# Patient Record
Sex: Female | Born: 1945
Health system: Southern US, Community
[De-identification: ages and names within clinical notes are randomized; demographics above are authoritative.]

## PROBLEM LIST (undated history)

## (undated) DIAGNOSIS — Z8619 Personal history of other infectious and parasitic diseases: Secondary | ICD-10-CM

## (undated) DIAGNOSIS — H269 Unspecified cataract: Secondary | ICD-10-CM

## (undated) DIAGNOSIS — E785 Hyperlipidemia, unspecified: Secondary | ICD-10-CM

## (undated) DIAGNOSIS — C50919 Malignant neoplasm of unspecified site of unspecified female breast: Secondary | ICD-10-CM

## (undated) DIAGNOSIS — M329 Systemic lupus erythematosus, unspecified: Secondary | ICD-10-CM

## (undated) DIAGNOSIS — F419 Anxiety disorder, unspecified: Secondary | ICD-10-CM

## (undated) DIAGNOSIS — C55 Malignant neoplasm of uterus, part unspecified: Secondary | ICD-10-CM

## (undated) DIAGNOSIS — F4310 Post-traumatic stress disorder, unspecified: Secondary | ICD-10-CM

## (undated) HISTORY — PX: PLACEMENT OF BREAST IMPLANTS: SHX6334

## (undated) HISTORY — DX: Post-traumatic stress disorder, unspecified: F43.10

## (undated) HISTORY — PX: WRIST SURGERY: SHX841

## (undated) HISTORY — DX: Personal history of other infectious and parasitic diseases: Z86.19

## (undated) HISTORY — PX: TONSILLECTOMY: SUR1361

## (undated) HISTORY — PX: RECONSTRUCTION / CORRECTION OF NIPPLE / AEROLA: SUR1073

## (undated) HISTORY — PX: DILATION AND CURETTAGE OF UTERUS: SHX78

## (undated) HISTORY — DX: Unspecified cataract: H26.9

## (undated) HISTORY — DX: Hyperlipidemia, unspecified: E78.5

## (undated) HISTORY — PX: MASTECTOMY: SHX3

## (undated) HISTORY — PX: BREAST IMPLANT REMOVAL: SHX5361

## (undated) HISTORY — PX: BACK SURGERY: SHX140

## (undated) HISTORY — PX: APPENDECTOMY: SHX54

## (undated) HISTORY — PX: WISDOM TOOTH EXTRACTION: SHX21

---

## 1974-07-31 HISTORY — PX: ABDOMINAL HYSTERECTOMY: SHX81

## 1989-07-31 HISTORY — PX: BREAST SURGERY: SHX581

## 2003-08-01 HISTORY — PX: JOINT REPLACEMENT: SHX530

## 2011-11-08 DIAGNOSIS — N393 Stress incontinence (female) (male): Secondary | ICD-10-CM | POA: Diagnosis not present

## 2011-11-08 DIAGNOSIS — M1710 Unilateral primary osteoarthritis, unspecified knee: Secondary | ICD-10-CM | POA: Diagnosis not present

## 2011-11-08 DIAGNOSIS — F329 Major depressive disorder, single episode, unspecified: Secondary | ICD-10-CM | POA: Diagnosis not present

## 2011-11-08 DIAGNOSIS — M329 Systemic lupus erythematosus, unspecified: Secondary | ICD-10-CM | POA: Diagnosis not present

## 2011-11-15 DIAGNOSIS — H16149 Punctate keratitis, unspecified eye: Secondary | ICD-10-CM | POA: Diagnosis not present

## 2011-11-22 DIAGNOSIS — H16149 Punctate keratitis, unspecified eye: Secondary | ICD-10-CM | POA: Diagnosis not present

## 2011-12-12 DIAGNOSIS — N816 Rectocele: Secondary | ICD-10-CM | POA: Diagnosis not present

## 2011-12-12 DIAGNOSIS — Z01419 Encounter for gynecological examination (general) (routine) without abnormal findings: Secondary | ICD-10-CM | POA: Diagnosis not present

## 2011-12-12 DIAGNOSIS — Z1289 Encounter for screening for malignant neoplasm of other sites: Secondary | ICD-10-CM | POA: Diagnosis not present

## 2011-12-12 DIAGNOSIS — N8111 Cystocele, midline: Secondary | ICD-10-CM | POA: Diagnosis not present

## 2011-12-18 DIAGNOSIS — Z96659 Presence of unspecified artificial knee joint: Secondary | ICD-10-CM | POA: Diagnosis not present

## 2011-12-18 DIAGNOSIS — M25569 Pain in unspecified knee: Secondary | ICD-10-CM | POA: Diagnosis not present

## 2011-12-18 DIAGNOSIS — M1710 Unilateral primary osteoarthritis, unspecified knee: Secondary | ICD-10-CM | POA: Diagnosis not present

## 2012-04-22 DIAGNOSIS — N816 Rectocele: Secondary | ICD-10-CM | POA: Diagnosis not present

## 2012-04-22 DIAGNOSIS — N8111 Cystocele, midline: Secondary | ICD-10-CM | POA: Diagnosis not present

## 2012-04-22 DIAGNOSIS — Z01818 Encounter for other preprocedural examination: Secondary | ICD-10-CM | POA: Diagnosis not present

## 2012-05-08 DIAGNOSIS — N393 Stress incontinence (female) (male): Secondary | ICD-10-CM | POA: Diagnosis not present

## 2012-05-08 DIAGNOSIS — Z853 Personal history of malignant neoplasm of breast: Secondary | ICD-10-CM | POA: Diagnosis not present

## 2012-05-08 DIAGNOSIS — D649 Anemia, unspecified: Secondary | ICD-10-CM | POA: Diagnosis not present

## 2012-05-08 DIAGNOSIS — M329 Systemic lupus erythematosus, unspecified: Secondary | ICD-10-CM | POA: Diagnosis not present

## 2012-05-08 DIAGNOSIS — Z8541 Personal history of malignant neoplasm of cervix uteri: Secondary | ICD-10-CM | POA: Diagnosis not present

## 2012-05-08 DIAGNOSIS — F329 Major depressive disorder, single episode, unspecified: Secondary | ICD-10-CM | POA: Diagnosis not present

## 2012-05-08 DIAGNOSIS — E039 Hypothyroidism, unspecified: Secondary | ICD-10-CM | POA: Diagnosis not present

## 2012-05-08 DIAGNOSIS — N814 Uterovaginal prolapse, unspecified: Secondary | ICD-10-CM | POA: Diagnosis not present

## 2012-05-08 DIAGNOSIS — N8111 Cystocele, midline: Secondary | ICD-10-CM | POA: Diagnosis not present

## 2012-05-08 DIAGNOSIS — N816 Rectocele: Secondary | ICD-10-CM | POA: Diagnosis not present

## 2012-05-09 DIAGNOSIS — D649 Anemia, unspecified: Secondary | ICD-10-CM | POA: Diagnosis not present

## 2012-05-09 DIAGNOSIS — F329 Major depressive disorder, single episode, unspecified: Secondary | ICD-10-CM | POA: Diagnosis not present

## 2012-05-09 DIAGNOSIS — M329 Systemic lupus erythematosus, unspecified: Secondary | ICD-10-CM | POA: Diagnosis not present

## 2012-05-09 DIAGNOSIS — N393 Stress incontinence (female) (male): Secondary | ICD-10-CM | POA: Diagnosis not present

## 2012-05-09 DIAGNOSIS — E039 Hypothyroidism, unspecified: Secondary | ICD-10-CM | POA: Diagnosis not present

## 2012-05-09 DIAGNOSIS — N814 Uterovaginal prolapse, unspecified: Secondary | ICD-10-CM | POA: Diagnosis not present

## 2012-05-23 DIAGNOSIS — R3989 Other symptoms and signs involving the genitourinary system: Secondary | ICD-10-CM | POA: Diagnosis not present

## 2012-10-17 DIAGNOSIS — E039 Hypothyroidism, unspecified: Secondary | ICD-10-CM | POA: Diagnosis not present

## 2012-10-17 DIAGNOSIS — M25569 Pain in unspecified knee: Secondary | ICD-10-CM | POA: Diagnosis not present

## 2013-02-25 ENCOUNTER — Ambulatory Visit: Payer: Self-pay | Admitting: General Practice

## 2013-03-03 ENCOUNTER — Ambulatory Visit: Payer: Medicare Other | Admitting: General Practice

## 2013-03-08 NOTE — Progress Notes (Signed)
Patient ID: Kimberly Roth, female   DOB: 1946/01/05, 67 y.o.   MRN: [id number] No Show

## 2013-04-09 ENCOUNTER — Encounter: Payer: Self-pay | Admitting: Nurse Practitioner

## 2013-04-09 ENCOUNTER — Ambulatory Visit (INDEPENDENT_AMBULATORY_CARE_PROVIDER_SITE_OTHER): Payer: Medicare Other | Admitting: Nurse Practitioner

## 2013-04-09 VITALS — BP 129/77 | HR 86 | Temp 96.7°F | Ht 65.0 in | Wt 132.0 lb

## 2013-04-09 DIAGNOSIS — F329 Major depressive disorder, single episode, unspecified: Secondary | ICD-10-CM

## 2013-04-09 DIAGNOSIS — E039 Hypothyroidism, unspecified: Secondary | ICD-10-CM | POA: Diagnosis not present

## 2013-04-09 DIAGNOSIS — F411 Generalized anxiety disorder: Secondary | ICD-10-CM | POA: Diagnosis not present

## 2013-04-09 MED ORDER — CHLORDIAZEPOXIDE HCL 25 MG PO CAPS: 25.0000 mg | ORAL_CAPSULE | Freq: Two times a day (BID) | ORAL | Status: DC

## 2013-04-09 MED ORDER — LEVOTHYROXINE SODIUM 200 MCG PO TABS: 200.0000 ug | ORAL_TABLET | Freq: Every day | ORAL | Status: DC

## 2013-04-09 MED ORDER — BUPROPION HCL 100 MG PO TABS: 100.0000 mg | ORAL_TABLET | Freq: Two times a day (BID) | ORAL | Status: DC

## 2013-04-09 NOTE — Patient Instructions (Signed)

## 2013-04-09 NOTE — Progress Notes (Signed)
Subjective:    Patient ID: Kimberly Roth, female    DOB: 09-29-1945, 67 y.o.   MRN: [id number]  Thyroid Problem Presents for initial visit. Symptoms include anxiety and depressed mood. Patient reports no fatigue, hair loss, heat intolerance, hoarse voice, palpitations or weight loss. The symptoms have been resolved. Past treatments include levothyroxine. The treatment provided significant relief. There is no history of hyperlipidemia or obesity.  Anxiety Presents for initial visit. Onset was more than 5 years ago. The problem has been resolved. Symptoms include depressed mood. Patient reports no insomnia, irritability, nervous/anxious behavior or palpitations. Symptoms occur rarely. The quality of sleep is fair. Nighttime awakenings: one to two.   Her past medical history is significant for depression. There is no history of bipolar disorder. Past treatments include SSRIs. The treatment provided significant relief. Compliance with prior treatments has been good.   Depression Pt on Wellbutrin 100mg - pt states this works well for her and has no complaints as long as she takes her medication.   Review of Systems  Constitutional: Negative for weight loss, irritability and fatigue.  HENT: Negative for hoarse voice.   Cardiovascular: Negative for palpitations.  Endocrine: Negative for heat intolerance.  Psychiatric/Behavioral: The patient is not nervous/anxious and does not have insomnia.   All other systems reviewed and are negative.       Objective:   Physical Exam  Constitutional: She is oriented to person, place, and time. She appears well-developed and well-nourished.  HENT:  Nose: Nose normal.  Mouth/Throat: Oropharynx is clear and moist.  Eyes: EOM are normal.  Neck: Trachea normal, normal range of motion and full passive range of motion without pain. Neck supple. No JVD present. Carotid bruit is not present. No thyromegaly present.  Cardiovascular: Normal rate, regular rhythm,  normal heart sounds and intact distal pulses.  Exam reveals no gallop and no friction rub.   No murmur heard. Pulmonary/Chest: Effort normal and breath sounds normal.  Abdominal: Soft. Bowel sounds are normal. She exhibits no distension and no mass. There is no tenderness.  Musculoskeletal: Normal range of motion.  Lymphadenopathy:    She has no cervical adenopathy.  Neurological: She is alert and oriented to person, place, and time. She has normal reflexes.  Skin: Skin is warm and dry.  Psychiatric: She has a normal mood and affect. Her behavior is normal. Judgment and thought content normal.      BP 129/77  Pulse 86  Temp(Src) 96.7 F (35.9 C) (Oral)  Ht 5\' 5"  (1.651 m)  Wt 132 lb (59.875 kg)  BMI 21.97 kg/m2     Assessment & Plan:   1. Hypothyroidism   2. Depression   3. GAD (generalized anxiety disorder)    Orders Placed This Encounter  Procedures  . CMP14+EGFR  . NMR, lipoprofile  . Thyroid Panel With TSH     Medication List       This list is accurate as of: 04/09/13  2:43 PM.  Always use your most recent med list.               buPROPion 100 MG tablet  Commonly known as:  WELLBUTRIN  Take 1 tablet (100 mg total) by mouth 2 (two) times daily. 2 tabs in am and one in pm     chlordiazePOXIDE 25 MG capsule  Commonly known as:  LIBRIUM  Take 1 capsule (25 mg total) by mouth 2 (two) times daily.     levothyroxine 200 MCG tablet  Commonly known as:  SYNTHROID, LEVOTHROID  Take 1 tablet (200 mcg total) by mouth daily before breakfast.        continue all meds Labs pending Follow up in 3 months  Kimberly Daphine Deutscher, FNP

## 2013-04-11 ENCOUNTER — Other Ambulatory Visit: Payer: Self-pay | Admitting: Nurse Practitioner

## 2013-04-11 DIAGNOSIS — E039 Hypothyroidism, unspecified: Secondary | ICD-10-CM

## 2013-04-11 LAB — NMR, LIPOPROFILE
Cholesterol: 207 mg/dL — ABNORMAL HIGH
HDL Cholesterol by NMR: 72 mg/dL
HDL Particle Number: 35.6 umol/L
LDL Particle Number: 1196 nmol/L — ABNORMAL HIGH
LDL Size: 21.5 nm
LDLC SERPL CALC-MCNC: 123 mg/dL — ABNORMAL HIGH
LP-IR Score: <25
Small LDL Particle Number: 170 nmol/L
Triglycerides by NMR: 60 mg/dL

## 2013-04-11 LAB — CMP14+EGFR
ALT: 17 IU/L (ref 0–32)
AST: 20 IU/L (ref 0–40)
Albumin/Globulin Ratio: 2.7 — ABNORMAL HIGH (ref 1.1–2.5)
Albumin: 4.6 g/dL (ref 3.6–4.8)
Alkaline Phosphatase: 75 IU/L (ref 39–117)
BUN/Creatinine Ratio: 28 — ABNORMAL HIGH (ref 11–26)
BUN: 16 mg/dL (ref 8–27)
CO2: 28 mmol/L (ref 18–29)
Calcium: 10 mg/dL (ref 8.6–10.2)
Chloride: 99 mmol/L (ref 97–108)
Creatinine, Ser: 0.57 mg/dL (ref 0.57–1.00)
GFR calc Af Amer: 112 mL/min/1.73
GFR calc non Af Amer: 97 mL/min/1.73
Globulin, Total: 1.7 g/dL (ref 1.5–4.5)
Glucose: 91 mg/dL (ref 65–99)
Potassium: 4.5 mmol/L (ref 3.5–5.2)
Sodium: 140 mmol/L (ref 134–144)
Total Bilirubin: 0.2 mg/dL (ref 0.0–1.2)
Total Protein: 6.3 g/dL (ref 6.0–8.5)

## 2013-04-11 LAB — THYROID PANEL WITH TSH
Free Thyroxine Index: 5.1 — ABNORMAL HIGH (ref 1.2–4.9)
T3 Uptake Ratio: 32 % (ref 24–39)
T4, Total: 15.9 ug/dL — ABNORMAL HIGH (ref 4.5–12.0)
TSH: 0.008 u[IU]/mL — ABNORMAL LOW (ref 0.450–4.500)

## 2013-04-11 MED ORDER — LEVOTHYROXINE SODIUM 200 MCG PO TABS: 200.0000 ug | ORAL_TABLET | Freq: Every day | ORAL | Status: DC

## 2013-04-11 MED ORDER — LEVOTHYROXINE SODIUM 175 MCG PO TABS: 175.0000 ug | ORAL_TABLET | Freq: Every day | ORAL | Status: DC

## 2013-04-30 DIAGNOSIS — H5210 Myopia, unspecified eye: Secondary | ICD-10-CM | POA: Diagnosis not present

## 2013-04-30 DIAGNOSIS — H524 Presbyopia: Secondary | ICD-10-CM | POA: Diagnosis not present

## 2013-06-10 ENCOUNTER — Other Ambulatory Visit: Payer: Self-pay

## 2013-06-10 DIAGNOSIS — F411 Generalized anxiety disorder: Secondary | ICD-10-CM

## 2013-06-10 MED ORDER — CHLORDIAZEPOXIDE HCL 25 MG PO CAPS: 25.0000 mg | ORAL_CAPSULE | Freq: Two times a day (BID) | ORAL | Status: DC

## 2013-06-10 NOTE — Telephone Encounter (Signed)
Last see 04/09/13 MMM   Patient requesting 180   If approved print and route to nurse

## 2013-06-11 ENCOUNTER — Telehealth: Payer: Self-pay | Admitting: Nurse Practitioner

## 2013-07-31 HISTORY — PX: EYE SURGERY: SHX253

## 2013-10-14 DIAGNOSIS — H2510 Age-related nuclear cataract, unspecified eye: Secondary | ICD-10-CM | POA: Diagnosis not present

## 2013-10-14 DIAGNOSIS — H04129 Dry eye syndrome of unspecified lacrimal gland: Secondary | ICD-10-CM | POA: Diagnosis not present

## 2013-10-17 ENCOUNTER — Encounter (HOSPITAL_COMMUNITY): Payer: Self-pay | Admitting: Pharmacy Technician

## 2013-10-21 DIAGNOSIS — H2510 Age-related nuclear cataract, unspecified eye: Secondary | ICD-10-CM | POA: Diagnosis not present

## 2013-10-24 ENCOUNTER — Encounter (HOSPITAL_COMMUNITY): Admission: RE | Admit: 2013-10-24 | Payer: Self-pay | Source: Ambulatory Visit

## 2013-10-27 ENCOUNTER — Encounter (HOSPITAL_COMMUNITY)
Admission: RE | Admit: 2013-10-27 | Discharge: 2013-10-27 | Disposition: A | Discharge status: Home / Self Care | Payer: Medicare Other | Source: Ambulatory Visit | Attending: Ophthalmology | Admitting: Ophthalmology

## 2013-10-27 ENCOUNTER — Encounter (HOSPITAL_COMMUNITY): Payer: Self-pay

## 2013-10-27 DIAGNOSIS — Z79899 Other long term (current) drug therapy: Secondary | ICD-10-CM | POA: Diagnosis not present

## 2013-10-27 DIAGNOSIS — Z7982 Long term (current) use of aspirin: Secondary | ICD-10-CM | POA: Diagnosis not present

## 2013-10-27 DIAGNOSIS — H2510 Age-related nuclear cataract, unspecified eye: Secondary | ICD-10-CM | POA: Diagnosis not present

## 2013-10-27 DIAGNOSIS — F411 Generalized anxiety disorder: Secondary | ICD-10-CM | POA: Diagnosis not present

## 2013-10-27 HISTORY — DX: Anxiety disorder, unspecified: F41.9

## 2013-10-27 HISTORY — DX: Malignant neoplasm of unspecified site of unspecified female breast: C50.919

## 2013-10-27 HISTORY — DX: Malignant neoplasm of uterus, part unspecified: C55

## 2013-10-27 HISTORY — DX: Systemic lupus erythematosus, unspecified: M32.9

## 2013-10-27 LAB — BASIC METABOLIC PANEL
BUN: 11 mg/dL (ref 6–23)
CHLORIDE: 98 meq/L (ref 96–112)
CO2: 30 mEq/L (ref 19–32)
Calcium: 9.7 mg/dL (ref 8.4–10.5)
Creatinine, Ser: 0.73 mg/dL (ref 0.50–1.10)
GFR calc non Af Amer: 87 mL/min — ABNORMAL LOW (ref >90)
GLUCOSE: 92 mg/dL (ref 70–99)
Potassium: 4.1 mEq/L (ref 3.7–5.3)
Sodium: 138 mEq/L (ref 137–147)

## 2013-10-27 LAB — HEMOGLOBIN AND HEMATOCRIT, BLOOD
HCT: 35.2 % — ABNORMAL LOW (ref 36.0–46.0)
HEMOGLOBIN: 11.8 g/dL — AB (ref 12.0–15.0)

## 2013-10-27 NOTE — Patient Instructions (Signed)
Cataract A cataract is a clouding of the lens of the eye. When a lens becomes cloudy, vision is reduced based on the degree and nature of the clouding. Many cataracts reduce vision to some degree. Some cataracts make people more near-sighted as they develop. Other cataracts increase glare. Cataracts that are ignored and become worse can sometimes look white. The white color can be seen through the pupil. CAUSES   Aging. However, cataracts may occur at any age, even in newborns.  Certain drugs. Trauma to the eye.Cataract Surgery Care After Refer to this sheet in the next few weeks. These instructions provide you with information on caring for yourself after your procedure. Your caregiver may also give you more specific instructions. Your treatment has been planned according to current medical practices, but problems sometimes occur. Call your caregiver if you have any problems or questions after your procedure.  HOME CARE INSTRUCTIONS  Avoid strenuous activities as directed by your caregiver. Ask your caregiver when you can resume driving. Use eyedrops or other medicines to help healing and control pressure inside your eye as directed by your caregiver. Only take over-the-counter or prescription medicines for pain, discomfort, or fever as directed by your caregiver. Do not to touch or rub your eyes. You may be instructed to use a protective shield during the first few days and nights after surgery. If not, wear sunglasses to protect your eyes. This is to protect the eye from pressure or from being accidentally bumped. Keep the area around your eye clean and dry. Avoid swimming or allowing water to hit you directly in the face while showering. Keep soap and shampoo out of your eyes. Do not bend or lift heavy objects. Bending increases pressure in the eye. You can walk, climb stairs, and do light household chores. Do not put a contact lens into the eye that had surgery until your caregiver says it is  okay to do so. Ask your doctor when you can return to work. This will depend on the kind of work that you do. If you work in a dusty environment, you may be advised to wear protective eyewear for a period of time. Ask your caregiver when it will be safe to engage in sexual activity. Continue with your regular eye exams as directed by your caregiver. What to expect: It is normal to feel itching and mild discomfort for a few days after cataract surgery. Some fluid discharge is also common, and your eye may be sensitive to light and touch. After 1 to 2 days, even moderate discomfort should disappear. In most cases, healing will take about 6 weeks. If you received an intraocular lens (IOL), you may notice that colors are very bright or have a blue tinge. Also, if you have been in bright sunlight, everything may appear reddish for a few hours. If you see these color tinges, it is because your lens is clear and no longer cloudy. Within a few months after receiving an IOL, these extra colors should go away. When you have healed, you will probably need new glasses. SEEK MEDICAL CARE IF:  You have increased bruising around your eye. You have discomfort not helped by medicine. SEEK IMMEDIATE MEDICAL CARE IF:  You have a fever. You have a worsening or sudden vision loss. You have redness, swelling, or increasing pain in the eye. You have a thick discharge from the eye that had surgery. MAKE SURE YOU: Understand these instructions. Will watch your condition. Will get help right away if  you are not doing well or get worse. Document Released: 02/03/2005 Document Revised: 10/09/2011 Document Reviewed: 03/10/2011 [facility] Patient Information 2014 [place], [place].  Certain diseases such as diabetes.  Specific eye diseases such as chronic inflammation inside the eye or a sudden attack of a rare form of glaucoma.  Inherited or acquired medical problems. SYMPTOMS   Gradual, progressive drop in vision in  the affected eye.  Severe, rapid visual loss. This most often happens when trauma is the cause. DIAGNOSIS  To detect a cataract, an eye doctor examines the lens. Cataracts are best diagnosed with an exam of the eyes with the pupils enlarged (dilated) by drops.  TREATMENT  For an early cataract, vision may improve by using different eyeglasses or stronger lighting. If that does not help your vision, surgery is the only effective treatment. A cataract needs to be surgically removed when vision loss interferes with your everyday activities, such as driving, reading, or watching TV. A cataract may also have to be removed if it prevents examination or treatment of another eye problem. Surgery removes the cloudy lens and usually replaces it with a substitute lens (intraocular lens, IOL).  At a time when both you and your doctor agree, the cataract will be surgically removed. If you have cataracts in both eyes, only one is usually removed at a time. This allows the operated eye to heal and be out of danger from any possible problems after surgery (such as infection or poor wound healing). In rare cases, a cataract may be doing damage to your eye. In these cases, your caregiver may advise surgical removal right away. The vast majority of people who have cataract surgery have better vision afterward. HOME CARE INSTRUCTIONS  If you are not planning surgery, you may be asked to do the following:  Use different eyeglasses.  Use stronger or brighter lighting.  Ask your eye doctor about reducing your medicine dose or changing medicines if it is thought that a medicine caused your cataract. Changing medicines does not make the cataract go away on its own.  Become familiar with your surroundings. Poor vision can lead to injury. Avoid bumping into things on the affected side. You are at a higher risk for tripping or falling.  Exercise extreme care when driving or operating machinery.  Wear sunglasses if you are  sensitive to bright light or experiencing problems with glare. SEEK IMMEDIATE MEDICAL CARE IF:   You have a worsening or sudden vision loss.  You notice redness, swelling, or increasing pain in the eye.  You have a fever. Document Released: 07/17/2005 Document Revised: 10/09/2011 Document Reviewed: 03/10/2011 [facility] Patient Information 2014 [place], [place].

## 2013-10-29 MED ORDER — CYCLOPENTOLATE-PHENYLEPHRINE OP SOLN OPTIME - NO CHARGE
OPHTHALMIC | Status: AC
  Filled 2013-10-29: qty 2

## 2013-10-29 MED ORDER — LIDOCAINE HCL 3.5 % OP GEL
OPHTHALMIC | Status: AC
  Filled 2013-10-29: qty 1

## 2013-10-29 MED ORDER — NEOMYCIN-POLYMYXIN-DEXAMETH 3.5-10000-0.1 OP SUSP
OPHTHALMIC | Status: AC
  Filled 2013-10-29: qty 5

## 2013-10-29 MED ORDER — LIDOCAINE HCL (PF) 1 % IJ SOLN
INTRAMUSCULAR | Status: AC
  Filled 2013-10-29: qty 2

## 2013-10-29 MED ORDER — PHENYLEPHRINE HCL 2.5 % OP SOLN
OPHTHALMIC | Status: AC
  Filled 2013-10-29: qty 15

## 2013-10-29 MED ORDER — TETRACAINE HCL 0.5 % OP SOLN
OPHTHALMIC | Status: AC
  Filled 2013-10-29: qty 2

## 2013-10-30 ENCOUNTER — Encounter (HOSPITAL_COMMUNITY): Payer: Medicare Other | Admitting: Anesthesiology

## 2013-10-30 ENCOUNTER — Encounter (HOSPITAL_COMMUNITY): Payer: Self-pay | Admitting: *Deleted

## 2013-10-30 ENCOUNTER — Encounter (HOSPITAL_COMMUNITY)
Admission: RE | Disposition: A | Discharge status: Home / Self Care | Payer: Self-pay | Source: Ambulatory Visit | Attending: Ophthalmology

## 2013-10-30 ENCOUNTER — Ambulatory Visit (HOSPITAL_COMMUNITY): Payer: Medicare Other | Admitting: Anesthesiology

## 2013-10-30 ENCOUNTER — Ambulatory Visit (HOSPITAL_COMMUNITY)
Admission: RE | Admit: 2013-10-30 | Discharge: 2013-10-30 | Disposition: A | Discharge status: Home / Self Care | Payer: Medicare Other | Source: Ambulatory Visit | Attending: Ophthalmology | Admitting: Ophthalmology

## 2013-10-30 DIAGNOSIS — H269 Unspecified cataract: Secondary | ICD-10-CM | POA: Diagnosis not present

## 2013-10-30 DIAGNOSIS — F411 Generalized anxiety disorder: Secondary | ICD-10-CM | POA: Diagnosis not present

## 2013-10-30 DIAGNOSIS — Z79899 Other long term (current) drug therapy: Secondary | ICD-10-CM | POA: Diagnosis not present

## 2013-10-30 DIAGNOSIS — Z7982 Long term (current) use of aspirin: Secondary | ICD-10-CM | POA: Insufficient documentation

## 2013-10-30 DIAGNOSIS — H2510 Age-related nuclear cataract, unspecified eye: Secondary | ICD-10-CM | POA: Diagnosis not present

## 2013-10-30 HISTORY — PX: CATARACT EXTRACTION W/PHACO: SHX586

## 2013-10-30 SURGERY — PHACOEMULSIFICATION, CATARACT, WITH IOL INSERTION
Anesthesia: Monitor Anesthesia Care | Site: Eye | Laterality: Right

## 2013-10-30 MED ORDER — TETRACAINE HCL 0.5 % OP SOLN
1.0000 [drp] | OPHTHALMIC | Status: AC
  Administered 2013-10-30 (×3): 1 [drp] via OPHTHALMIC

## 2013-10-30 MED ORDER — MIDAZOLAM HCL 2 MG/2ML IJ SOLN
INTRAMUSCULAR | Status: AC
  Filled 2013-10-30: qty 2

## 2013-10-30 MED ORDER — CYCLOPENTOLATE-PHENYLEPHRINE 0.2-1 % OP SOLN
1.0000 [drp] | OPHTHALMIC | Status: AC
  Administered 2013-10-30 (×3): 1 [drp] via OPHTHALMIC

## 2013-10-30 MED ORDER — BSS IO SOLN
INTRAOCULAR | Status: DC | PRN
  Administered 2013-10-30: 10:00:00

## 2013-10-30 MED ORDER — FENTANYL CITRATE 0.05 MG/ML IJ SOLN
INTRAMUSCULAR | Status: AC
  Filled 2013-10-30: qty 2

## 2013-10-30 MED ORDER — POVIDONE-IODINE 5 % OP SOLN
OPHTHALMIC | Status: DC | PRN
  Administered 2013-10-30: 1 via OPHTHALMIC

## 2013-10-30 MED ORDER — LACTATED RINGERS IV SOLN
INTRAVENOUS | Status: DC
  Administered 2013-10-30: 09:00:00 via INTRAVENOUS

## 2013-10-30 MED ORDER — BSS IO SOLN
INTRAOCULAR | Status: DC | PRN
  Administered 2013-10-30: 15 mL via INTRAOCULAR

## 2013-10-30 MED ORDER — LIDOCAINE 3.5 % OP GEL OPTIME - NO CHARGE
OPHTHALMIC | Status: DC | PRN
  Administered 2013-10-30: 1 [drp] via OPHTHALMIC

## 2013-10-30 MED ORDER — NEOMYCIN-POLYMYXIN-DEXAMETH 3.5-10000-0.1 OP SUSP
OPHTHALMIC | Status: DC | PRN
  Administered 2013-10-30: 1 [drp] via OPHTHALMIC

## 2013-10-30 MED ORDER — EPINEPHRINE HCL 1 MG/ML IJ SOLN
INTRAMUSCULAR | Status: AC
  Filled 2013-10-30: qty 1

## 2013-10-30 MED ORDER — FENTANYL CITRATE 0.05 MG/ML IJ SOLN
25.0000 ug | INTRAMUSCULAR | Status: AC
  Administered 2013-10-30 (×2): 25 ug via INTRAVENOUS

## 2013-10-30 MED ORDER — PROVISC 10 MG/ML IO SOLN
INTRAOCULAR | Status: DC | PRN
  Administered 2013-10-30: 0.85 mL via INTRAOCULAR

## 2013-10-30 MED ORDER — PHENYLEPHRINE HCL 2.5 % OP SOLN
1.0000 [drp] | OPHTHALMIC | Status: AC
Start: 2013-10-30 — End: 2013-10-30
  Administered 2013-10-30 (×3): 1 [drp] via OPHTHALMIC

## 2013-10-30 MED ORDER — LIDOCAINE HCL (PF) 1 % IJ SOLN
INTRAMUSCULAR | Status: DC | PRN
Start: 2013-10-30 — End: 2013-10-30
  Administered 2013-10-30: .3 mL

## 2013-10-30 MED ORDER — LIDOCAINE HCL 3.5 % OP GEL
1.0000 "application " | Freq: Once | OPHTHALMIC | Status: AC
  Administered 2013-10-30: 1 via OPHTHALMIC

## 2013-10-30 MED ORDER — MIDAZOLAM HCL 2 MG/2ML IJ SOLN
1.0000 mg | INTRAMUSCULAR | Status: AC | PRN
  Administered 2013-10-30 (×3): 2 mg via INTRAVENOUS
  Filled 2013-10-30 (×2): qty 2

## 2013-10-30 SURGICAL SUPPLY — 33 items
CAPSULAR TENSION RING-AMO (OPHTHALMIC RELATED) IMPLANT
CLOTH BEACON ORANGE TIMEOUT ST (SAFETY) ×3 IMPLANT
EYE SHIELD UNIVERSAL CLEAR (GAUZE/BANDAGES/DRESSINGS) ×3 IMPLANT
GLOVE BIO SURGEON STRL SZ 6.5 (GLOVE) IMPLANT
GLOVE BIO SURGEONS STRL SZ 6.5 (GLOVE)
GLOVE BIOGEL PI IND STRL 6.5 (GLOVE) ×1 IMPLANT
GLOVE BIOGEL PI IND STRL 7.0 (GLOVE) IMPLANT
GLOVE BIOGEL PI IND STRL 7.5 (GLOVE) IMPLANT
GLOVE BIOGEL PI INDICATOR 6.5 (GLOVE) ×2
GLOVE BIOGEL PI INDICATOR 7.0 (GLOVE)
GLOVE BIOGEL PI INDICATOR 7.5 (GLOVE)
GLOVE ECLIPSE 6.5 STRL STRAW (GLOVE) IMPLANT
GLOVE ECLIPSE 7.0 STRL STRAW (GLOVE) IMPLANT
GLOVE ECLIPSE 7.5 STRL STRAW (GLOVE) IMPLANT
GLOVE EXAM NITRILE LRG STRL (GLOVE) IMPLANT
GLOVE EXAM NITRILE MD LF STRL (GLOVE) ×3 IMPLANT
GLOVE SKINSENSE NS SZ6.5 (GLOVE)
GLOVE SKINSENSE NS SZ7.0 (GLOVE)
GLOVE SKINSENSE STRL SZ6.5 (GLOVE) IMPLANT
GLOVE SKINSENSE STRL SZ7.0 (GLOVE) IMPLANT
KIT VITRECTOMY (OPHTHALMIC RELATED) IMPLANT
PAD ARMBOARD 7.5X6 YLW CONV (MISCELLANEOUS) ×3 IMPLANT
PROC W NO LENS (INTRAOCULAR LENS)
PROC W SPEC LENS (INTRAOCULAR LENS)
PROCESS W NO LENS (INTRAOCULAR LENS) IMPLANT
PROCESS W SPEC LENS (INTRAOCULAR LENS) IMPLANT
RING MALYGIN (MISCELLANEOUS) IMPLANT
SIGHTPATH CAT PROC W REG LENS (Ophthalmic Related) ×3 IMPLANT
SYR TB 1ML LL NO SAFETY (SYRINGE) ×3 IMPLANT
TAPE SURG TRANSPORE 1 IN (GAUZE/BANDAGES/DRESSINGS) ×1 IMPLANT
TAPE SURGICAL TRANSPORE 1 IN (GAUZE/BANDAGES/DRESSINGS) ×2
VISCOELASTIC ADDITIONAL (OPHTHALMIC RELATED) IMPLANT
WATER STERILE IRR 250ML POUR (IV SOLUTION) ×3 IMPLANT

## 2013-10-30 NOTE — Op Note (Signed)
Date of Admission: 10/30/2013  Date of Surgery: 10/30/2013   Pre-Op Dx: Cataract Right Eye  Post-Op Dx: Nuclear Cataract Right  Eye,  Dx Code 366.16  Surgeon: Tonny Branch, M.D.  Assistants: None  Anesthesia: Topical with MAC  Indications: Painless, progressive loss of vision with compromise of daily activities.  Surgery: Cataract Extraction with Intraocular lens Implant Right Eye  Discription: The patient had dilating drops and viscous lidocaine placed into the Right eye in the pre-op holding area. After transfer to the operating room, a time out was performed. The patient was then prepped and draped. Beginning with a 69 degree blade a paracentesis port was made at the surgeon's 2 o'clock position. The anterior chamber was then filled with 1% non-preserved lidocaine. This was followed by filling the anterior chamber with Provisc.  A 2.51mm keratome blade was used to make a clear corneal incision at the temporal limbus.  A bent cystatome needle was used to create a continuous tear capsulotomy. Hydrodissection was performed with balanced salt solution on a Fine canula. The lens nucleus was then removed using the phacoemulsification handpiece. Residual cortex was removed with the I&A handpiece. The anterior chamber and capsular bag were refilled with Provisc. A posterior chamber intraocular lens was placed into the capsular bag with it's injector. The implant was positioned with the Kuglan hook. The Provisc was then removed from the anterior chamber and capsular bag with the I&A handpiece. Stromal hydration of the main incision and paracentesis port was performed with BSS on a Fine canula. The wounds were tested for leak which was negative. The patient tolerated the procedure well. There were no operative complications. The patient was then transferred to the recovery room in stable condition.  Complications: None  Specimen: None  EBL: None  Prosthetic device: Hoya iSert 250, power 19.5 D, SN  [id number].

## 2013-10-30 NOTE — Transfer of Care (Signed)
Immediate Anesthesia Transfer of Care Note  Patient: Kimberly Roth  Procedure(s) Performed: Procedure(s) with comments: CATARACT EXTRACTION PHACO AND INTRAOCULAR LENS PLACEMENT (IOC) (Right) - CDE:10.13  Patient Location: Short Stay  Anesthesia Type:MAC  Level of Consciousness: awake, alert , oriented and patient cooperative  Airway & Oxygen Therapy: Patient Spontanous Breathing and Patient connected to face mask oxygen  Post-op Assessment: Report given to PACU RN, Post -op Vital signs reviewed and stable and Patient moving all extremities  Post vital signs: Reviewed and stable  Complications: No apparent anesthesia complications

## 2013-10-30 NOTE — Anesthesia Postprocedure Evaluation (Signed)
  Anesthesia Post-op Note  Patient: Kimberly Roth  Procedure(s) Performed: Procedure(s) with comments: CATARACT EXTRACTION PHACO AND INTRAOCULAR LENS PLACEMENT (IOC) (Right) - CDE:10.13  Patient Location: Short Stay  Anesthesia Type:MAC  Level of Consciousness: awake, alert , oriented and patient cooperative  Airway and Oxygen Therapy: Patient Spontanous Breathing  Post-op Pain: none  Post-op Assessment: Post-op Vital signs reviewed, Patient's Cardiovascular Status Stable, Respiratory Function Stable, Patent Airway and Pain level controlled  Post-op Vital Signs: Reviewed and stable  Complications: No apparent anesthesia complications

## 2013-10-30 NOTE — H&P (Signed)
I have reviewed the H&P, the patient was re-examined, and I have identified no interval changes in medical condition and plan of care since the history and physical of record  

## 2013-10-30 NOTE — Anesthesia Preprocedure Evaluation (Signed)
Anesthesia Evaluation  Patient identified by MRN, date of birth, ID band Patient awake    Reviewed: Allergy & Precautions, H&P , NPO status , Patient's Chart, lab work & pertinent test results  Airway Mallampati: II TM Distance: >3 FB Neck ROM: Full    Dental  (+) Edentulous Upper   Pulmonary neg pulmonary ROS,  breath sounds clear to auscultation        Cardiovascular negative cardio ROS  Rhythm:Regular Rate:Normal     Neuro/Psych PSYCHIATRIC DISORDERS Anxiety Depression    GI/Hepatic   Endo/Other  Hypothyroidism Hx systemic lupus  Renal/GU      Musculoskeletal   Abdominal   Peds  Hematology   Anesthesia Other Findings   Reproductive/Obstetrics                           Anesthesia Physical Anesthesia Plan  ASA: III  Anesthesia Plan: MAC   Post-op Pain Management:    Induction: Intravenous  Airway Management Planned: Nasal Cannula  Additional Equipment:   Intra-op Plan:   Post-operative Plan:   Informed Consent: I have reviewed the patients History and Physical, chart, labs and discussed the procedure including the risks, benefits and alternatives for the proposed anesthesia with the patient or authorized representative who has indicated his/her understanding and acceptance.     Plan Discussed with:   Anesthesia Plan Comments:         Anesthesia Quick Evaluation  

## 2013-10-30 NOTE — Discharge Instructions (Signed)

## 2013-11-03 ENCOUNTER — Encounter (HOSPITAL_COMMUNITY): Payer: Self-pay | Admitting: Pharmacy Technician

## 2013-11-03 DIAGNOSIS — H2510 Age-related nuclear cataract, unspecified eye: Secondary | ICD-10-CM | POA: Diagnosis not present

## 2013-11-04 ENCOUNTER — Encounter (HOSPITAL_COMMUNITY): Payer: Self-pay | Admitting: Ophthalmology

## 2013-11-05 MED ORDER — FENTANYL CITRATE 0.05 MG/ML IJ SOLN: 25.0000 ug | INTRAMUSCULAR | Status: DC | PRN

## 2013-11-05 MED ORDER — ONDANSETRON HCL 4 MG/2ML IJ SOLN: 4.0000 mg | Freq: Once | INTRAMUSCULAR | Status: AC | PRN

## 2013-11-06 ENCOUNTER — Encounter (HOSPITAL_COMMUNITY)
Admission: RE | Admit: 2013-11-06 | Discharge: 2013-11-06 | Disposition: A | Discharge status: Home / Self Care | Payer: Medicare Other | Source: Ambulatory Visit | Attending: Ophthalmology | Admitting: Ophthalmology

## 2013-11-06 ENCOUNTER — Encounter (HOSPITAL_COMMUNITY): Payer: Self-pay

## 2013-11-07 MED ORDER — TETRACAINE HCL 0.5 % OP SOLN
OPHTHALMIC | Status: AC
  Filled 2013-11-07: qty 2

## 2013-11-07 MED ORDER — LIDOCAINE HCL (PF) 1 % IJ SOLN
INTRAMUSCULAR | Status: AC
  Filled 2013-11-07: qty 2

## 2013-11-07 MED ORDER — NEOMYCIN-POLYMYXIN-DEXAMETH 3.5-10000-0.1 OP SUSP
OPHTHALMIC | Status: AC
  Filled 2013-11-07: qty 5

## 2013-11-07 MED ORDER — CYCLOPENTOLATE-PHENYLEPHRINE OP SOLN OPTIME - NO CHARGE
OPHTHALMIC | Status: AC
  Filled 2013-11-07: qty 2

## 2013-11-07 MED ORDER — PHENYLEPHRINE HCL 2.5 % OP SOLN
OPHTHALMIC | Status: AC
  Filled 2013-11-07: qty 15

## 2013-11-07 MED ORDER — LIDOCAINE HCL 3.5 % OP GEL
OPHTHALMIC | Status: AC
  Filled 2013-11-07: qty 1

## 2013-11-10 ENCOUNTER — Encounter (HOSPITAL_COMMUNITY): Payer: Self-pay | Admitting: *Deleted

## 2013-11-10 ENCOUNTER — Ambulatory Visit (HOSPITAL_COMMUNITY): Payer: Medicare Other | Admitting: Anesthesiology

## 2013-11-10 ENCOUNTER — Encounter (HOSPITAL_COMMUNITY)
Admission: RE | Disposition: A | Discharge status: Home / Self Care | Payer: Self-pay | Source: Ambulatory Visit | Attending: Ophthalmology

## 2013-11-10 ENCOUNTER — Ambulatory Visit (HOSPITAL_COMMUNITY)
Admission: RE | Admit: 2013-11-10 | Discharge: 2013-11-10 | Disposition: A | Discharge status: Home / Self Care | Payer: Medicare Other | Source: Ambulatory Visit | Attending: Ophthalmology | Admitting: Ophthalmology

## 2013-11-10 ENCOUNTER — Encounter (HOSPITAL_COMMUNITY): Payer: Medicare Other | Admitting: Anesthesiology

## 2013-11-10 DIAGNOSIS — H269 Unspecified cataract: Secondary | ICD-10-CM | POA: Diagnosis not present

## 2013-11-10 DIAGNOSIS — H2510 Age-related nuclear cataract, unspecified eye: Secondary | ICD-10-CM | POA: Diagnosis not present

## 2013-11-10 DIAGNOSIS — Z79899 Other long term (current) drug therapy: Secondary | ICD-10-CM | POA: Diagnosis not present

## 2013-11-10 DIAGNOSIS — Z7982 Long term (current) use of aspirin: Secondary | ICD-10-CM | POA: Diagnosis not present

## 2013-11-10 DIAGNOSIS — F411 Generalized anxiety disorder: Secondary | ICD-10-CM | POA: Insufficient documentation

## 2013-11-10 HISTORY — PX: CATARACT EXTRACTION W/PHACO: SHX586

## 2013-11-10 SURGERY — PHACOEMULSIFICATION, CATARACT, WITH IOL INSERTION
Anesthesia: Monitor Anesthesia Care | Site: Eye | Laterality: Left

## 2013-11-10 MED ORDER — CYCLOPENTOLATE-PHENYLEPHRINE 0.2-1 % OP SOLN
1.0000 [drp] | OPHTHALMIC | Status: AC
  Administered 2013-11-10 (×3): 1 [drp] via OPHTHALMIC

## 2013-11-10 MED ORDER — PHENYLEPHRINE HCL 2.5 % OP SOLN
1.0000 [drp] | OPHTHALMIC | Status: AC
  Administered 2013-11-10 (×3): 1 [drp] via OPHTHALMIC

## 2013-11-10 MED ORDER — FENTANYL CITRATE 0.05 MG/ML IJ SOLN: 25.0000 ug | INTRAMUSCULAR | Status: DC | PRN

## 2013-11-10 MED ORDER — MIDAZOLAM HCL 2 MG/2ML IJ SOLN
INTRAMUSCULAR | Status: AC
  Filled 2013-11-10: qty 2

## 2013-11-10 MED ORDER — MIDAZOLAM HCL 2 MG/2ML IJ SOLN
1.0000 mg | INTRAMUSCULAR | Status: DC | PRN
  Administered 2013-11-10: 2 mg via INTRAVENOUS

## 2013-11-10 MED ORDER — TETRACAINE HCL 0.5 % OP SOLN
1.0000 [drp] | OPHTHALMIC | Status: AC
  Administered 2013-11-10 (×3): 1 [drp] via OPHTHALMIC

## 2013-11-10 MED ORDER — NEOMYCIN-POLYMYXIN-DEXAMETH 3.5-10000-0.1 OP SUSP
OPHTHALMIC | Status: DC | PRN
  Administered 2013-11-10: 2 [drp] via OPHTHALMIC

## 2013-11-10 MED ORDER — BSS IO SOLN
INTRAOCULAR | Status: DC | PRN
  Administered 2013-11-10: 15 mL via INTRAOCULAR

## 2013-11-10 MED ORDER — FENTANYL CITRATE 0.05 MG/ML IJ SOLN
25.0000 ug | INTRAMUSCULAR | Status: AC
  Administered 2013-11-10 (×2): 25 ug via INTRAVENOUS

## 2013-11-10 MED ORDER — LACTATED RINGERS IV SOLN
INTRAVENOUS | Status: DC
  Administered 2013-11-10: 11:00:00 via INTRAVENOUS

## 2013-11-10 MED ORDER — EPINEPHRINE HCL 1 MG/ML IJ SOLN
INTRAMUSCULAR | Status: AC
  Filled 2013-11-10: qty 1

## 2013-11-10 MED ORDER — ONDANSETRON HCL 4 MG/2ML IJ SOLN: 4.0000 mg | Freq: Once | INTRAMUSCULAR | Status: DC | PRN

## 2013-11-10 MED ORDER — LIDOCAINE HCL 3.5 % OP GEL
1.0000 "application " | Freq: Once | OPHTHALMIC | Status: AC
  Administered 2013-11-10: 1 via OPHTHALMIC

## 2013-11-10 MED ORDER — PROVISC 10 MG/ML IO SOLN
INTRAOCULAR | Status: DC | PRN
  Administered 2013-11-10: 0.85 mL via INTRAOCULAR

## 2013-11-10 MED ORDER — POVIDONE-IODINE 5 % OP SOLN
OPHTHALMIC | Status: DC | PRN
  Administered 2013-11-10: 1 via OPHTHALMIC

## 2013-11-10 MED ORDER — EPINEPHRINE HCL 1 MG/ML IJ SOLN
INTRAOCULAR | Status: DC | PRN
  Administered 2013-11-10: 11:00:00

## 2013-11-10 MED ORDER — MIDAZOLAM HCL 5 MG/5ML IJ SOLN
INTRAMUSCULAR | Status: DC | PRN
  Administered 2013-11-10 (×2): 2 mg via INTRAVENOUS

## 2013-11-10 MED ORDER — LIDOCAINE HCL (PF) 1 % IJ SOLN
INTRAMUSCULAR | Status: DC | PRN
  Administered 2013-11-10: .6 mL

## 2013-11-10 SURGICAL SUPPLY — 33 items
CAPSULAR TENSION RING-AMO (OPHTHALMIC RELATED) IMPLANT
CLOTH BEACON ORANGE TIMEOUT ST (SAFETY) ×3 IMPLANT
EYE SHIELD UNIVERSAL CLEAR (GAUZE/BANDAGES/DRESSINGS) ×3 IMPLANT
GLOVE BIO SURGEON STRL SZ 6.5 (GLOVE) IMPLANT
GLOVE BIO SURGEONS STRL SZ 6.5 (GLOVE)
GLOVE BIOGEL PI IND STRL 6.5 (GLOVE) ×1 IMPLANT
GLOVE BIOGEL PI IND STRL 7.0 (GLOVE) ×1 IMPLANT
GLOVE BIOGEL PI IND STRL 7.5 (GLOVE) IMPLANT
GLOVE BIOGEL PI INDICATOR 6.5 (GLOVE) ×2
GLOVE BIOGEL PI INDICATOR 7.0 (GLOVE) ×2
GLOVE BIOGEL PI INDICATOR 7.5 (GLOVE)
GLOVE ECLIPSE 6.5 STRL STRAW (GLOVE) IMPLANT
GLOVE ECLIPSE 7.0 STRL STRAW (GLOVE) IMPLANT
GLOVE ECLIPSE 7.5 STRL STRAW (GLOVE) IMPLANT
GLOVE EXAM NITRILE LRG STRL (GLOVE) IMPLANT
GLOVE EXAM NITRILE MD LF STRL (GLOVE) IMPLANT
GLOVE SKINSENSE NS SZ6.5 (GLOVE)
GLOVE SKINSENSE NS SZ7.0 (GLOVE)
GLOVE SKINSENSE STRL SZ6.5 (GLOVE) IMPLANT
GLOVE SKINSENSE STRL SZ7.0 (GLOVE) IMPLANT
KIT VITRECTOMY (OPHTHALMIC RELATED) IMPLANT
PAD ARMBOARD 7.5X6 YLW CONV (MISCELLANEOUS) ×3 IMPLANT
PROC W NO LENS (INTRAOCULAR LENS)
PROC W SPEC LENS (INTRAOCULAR LENS)
PROCESS W NO LENS (INTRAOCULAR LENS) IMPLANT
PROCESS W SPEC LENS (INTRAOCULAR LENS) IMPLANT
RING MALYGIN (MISCELLANEOUS) IMPLANT
SIGHTPATH CAT PROC W REG LENS (Ophthalmic Related) ×3 IMPLANT
SYR TB 1ML LL NO SAFETY (SYRINGE) ×3 IMPLANT
TAPE SURG TRANSPORE 1 IN (GAUZE/BANDAGES/DRESSINGS) ×1 IMPLANT
TAPE SURGICAL TRANSPORE 1 IN (GAUZE/BANDAGES/DRESSINGS) ×2
VISCOELASTIC ADDITIONAL (OPHTHALMIC RELATED) IMPLANT
WATER STERILE IRR 250ML POUR (IV SOLUTION) ×3 IMPLANT

## 2013-11-10 NOTE — Op Note (Signed)
Date of Admission: 11/10/2013  Date of Surgery: 11/10/2013   Pre-Op Dx: Cataract Left Eye  Post-Op Dx: Nuclear Cataract Left  Eye,  Dx Code 366.16  Surgeon: Tonny Branch, M.D.  Assistants: None  Anesthesia: Topical with MAC  Indications: Painless, progressive loss of vision with compromise of daily activities.  Surgery: Cataract Extraction with Intraocular lens Implant Left Eye  Discription: The patient had dilating drops and viscous lidocaine placed into the Left eye in the pre-op holding area. After transfer to the operating room, a time out was performed. The patient was then prepped and draped. Beginning with a 31 degree blade a paracentesis port was made at the surgeon's 2 o'clock position. The anterior chamber was then filled with 1% non-preserved lidocaine. This was followed by filling the anterior chamber with Provisc.  A 2.46mm keratome blade was used to make a clear corneal incision at the temporal limbus.  A bent cystatome needle was used to create a continuous tear capsulotomy. Hydrodissection was performed with balanced salt solution on a Fine canula. The lens nucleus was then removed using the phacoemulsification handpiece. Residual cortex was removed with the I&A handpiece. The anterior chamber and capsular bag were refilled with Provisc. A posterior chamber intraocular lens was placed into the capsular bag with it's injector. The implant was positioned with the Kuglan hook. The Provisc was then removed from the anterior chamber and capsular bag with the I&A handpiece. Stromal hydration of the main incision and paracentesis port was performed with BSS on a Fine canula. The wounds were tested for leak which was negative. The patient tolerated the procedure well. There were no operative complications. The patient was then transferred to the recovery room in stable condition.  Complications: None  Specimen: None  EBL: None  Prosthetic device: Hoya iSert 250, power 20.5 D, SN  [id number].

## 2013-11-10 NOTE — Transfer of Care (Signed)
Immediate Anesthesia Transfer of Care Note  Patient: Kimberly Roth  Procedure(s) Performed: Procedure(s) with comments: CATARACT EXTRACTION PHACO AND INTRAOCULAR LENS PLACEMENT (IOC) (Left) - CDE 9.71  Patient Location: Short Stay  Anesthesia Type:MAC  Level of Consciousness: awake, alert , oriented and patient cooperative  Airway & Oxygen Therapy: Patient Spontanous Breathing  Post-op Assessment: Report given to PACU RN and Post -op Vital signs reviewed and stable  Post vital signs: Reviewed and stable  Complications: No apparent anesthesia complications

## 2013-11-10 NOTE — Anesthesia Postprocedure Evaluation (Signed)
  Anesthesia Post-op Note  Patient: Kimberly Roth  Procedure(s) Performed: Procedure(s) with comments: CATARACT EXTRACTION PHACO AND INTRAOCULAR LENS PLACEMENT (IOC) (Left) - CDE 9.71  Patient Location: Short Stay  Anesthesia Type:MAC  Level of Consciousness: awake, alert , oriented and patient cooperative  Airway and Oxygen Therapy: Patient Spontanous Breathing  Post-op Pain: none  Post-op Assessment: Post-op Vital signs reviewed, Patient's Cardiovascular Status Stable, Respiratory Function Stable and Pain level controlled  Post-op Vital Signs: Reviewed and stable  Last Vitals:  Filed Vitals:   11/10/13 1037  BP: 178/112  Pulse: 88  Temp: 37 C  Resp: 18    Complications: No apparent anesthesia complications

## 2013-11-10 NOTE — Anesthesia Preprocedure Evaluation (Signed)
Anesthesia Evaluation  Patient identified by MRN, date of birth, ID band Patient awake    Reviewed: Allergy & Precautions, H&P , NPO status , Patient's Chart, lab work & pertinent test results  Airway Mallampati: II TM Distance: >3 FB Neck ROM: Full    Dental  (+) Edentulous Upper   Pulmonary neg pulmonary ROS,  breath sounds clear to auscultation        Cardiovascular negative cardio ROS  Rhythm:Regular Rate:Normal     Neuro/Psych PSYCHIATRIC DISORDERS Anxiety Depression    GI/Hepatic   Endo/Other  Hypothyroidism Hx systemic lupus  Renal/GU      Musculoskeletal   Abdominal   Peds  Hematology   Anesthesia Other Findings   Reproductive/Obstetrics                           Anesthesia Physical Anesthesia Plan  ASA: III  Anesthesia Plan: MAC   Post-op Pain Management:    Induction: Intravenous  Airway Management Planned: Nasal Cannula  Additional Equipment:   Intra-op Plan:   Post-operative Plan:   Informed Consent: I have reviewed the patients History and Physical, chart, labs and discussed the procedure including the risks, benefits and alternatives for the proposed anesthesia with the patient or authorized representative who has indicated his/her understanding and acceptance.     Plan Discussed with:   Anesthesia Plan Comments:         Anesthesia Quick Evaluation

## 2013-11-10 NOTE — H&P (Signed)
I have reviewed the H&P, the patient was re-examined, and I have identified no interval changes in medical condition and plan of care since the history and physical of record  

## 2013-11-12 ENCOUNTER — Encounter (HOSPITAL_COMMUNITY): Payer: Self-pay | Admitting: Ophthalmology

## 2013-11-18 ENCOUNTER — Other Ambulatory Visit: Payer: Self-pay | Admitting: Family Medicine

## 2013-11-20 NOTE — Telephone Encounter (Signed)
rx ready for pickup 

## 2013-11-20 NOTE — Telephone Encounter (Signed)
Last seen 04/09/13  MMM

## 2013-12-29 LAB — HM MAMMOGRAPHY

## 2014-01-26 DIAGNOSIS — H11439 Conjunctival hyperemia, unspecified eye: Secondary | ICD-10-CM | POA: Diagnosis not present

## 2014-01-26 DIAGNOSIS — H02419 Mechanical ptosis of unspecified eyelid: Secondary | ICD-10-CM | POA: Diagnosis not present

## 2014-01-26 DIAGNOSIS — H01029 Squamous blepharitis unspecified eye, unspecified eyelid: Secondary | ICD-10-CM | POA: Diagnosis not present

## 2014-03-04 DIAGNOSIS — J029 Acute pharyngitis, unspecified: Secondary | ICD-10-CM | POA: Diagnosis not present

## 2014-03-04 DIAGNOSIS — R5381 Other malaise: Secondary | ICD-10-CM | POA: Diagnosis not present

## 2014-03-04 DIAGNOSIS — R5383 Other fatigue: Secondary | ICD-10-CM | POA: Diagnosis not present

## 2014-03-21 DIAGNOSIS — IMO0002 Reserved for concepts with insufficient information to code with codable children: Secondary | ICD-10-CM | POA: Diagnosis not present

## 2014-04-07 ENCOUNTER — Other Ambulatory Visit: Payer: Self-pay | Admitting: Family Medicine

## 2014-04-08 ENCOUNTER — Other Ambulatory Visit: Payer: Self-pay | Admitting: Family Medicine

## 2014-04-08 NOTE — Telephone Encounter (Signed)
Was told with last refill no more refills without being seen- This will be last refill until makes an appointment

## 2014-04-08 NOTE — Telephone Encounter (Signed)
Last ov 9/14. Ntbs. Last refill 12/04/13.

## 2014-04-08 NOTE — Telephone Encounter (Signed)
Patient aware.

## 2014-04-13 ENCOUNTER — Telehealth: Payer: Self-pay | Admitting: Nurse Practitioner

## 2014-04-13 DIAGNOSIS — F32A Depression, unspecified: Secondary | ICD-10-CM

## 2014-04-13 DIAGNOSIS — F329 Major depressive disorder, single episode, unspecified: Secondary | ICD-10-CM

## 2014-04-13 MED ORDER — BUPROPION HCL 100 MG PO TABS: 100.0000 mg | ORAL_TABLET | Freq: Two times a day (BID) | ORAL | Status: DC

## 2014-04-13 NOTE — Telephone Encounter (Signed)
I sent the Rx to the pharmacy.

## 2014-05-07 ENCOUNTER — Ambulatory Visit (INDEPENDENT_AMBULATORY_CARE_PROVIDER_SITE_OTHER): Payer: Medicare Other | Admitting: Nurse Practitioner

## 2014-05-07 ENCOUNTER — Encounter: Payer: Self-pay | Admitting: Nurse Practitioner

## 2014-05-07 VITALS — BP 137/78 | HR 79 | Temp 98.1°F | Ht 63.0 in | Wt 134.8 lb

## 2014-05-07 DIAGNOSIS — E0789 Other specified disorders of thyroid: Secondary | ICD-10-CM | POA: Diagnosis not present

## 2014-05-07 DIAGNOSIS — N951 Menopausal and female climacteric states: Secondary | ICD-10-CM

## 2014-05-07 DIAGNOSIS — E034 Atrophy of thyroid (acquired): Secondary | ICD-10-CM

## 2014-05-07 DIAGNOSIS — F411 Generalized anxiety disorder: Secondary | ICD-10-CM

## 2014-05-07 DIAGNOSIS — F329 Major depressive disorder, single episode, unspecified: Secondary | ICD-10-CM | POA: Diagnosis not present

## 2014-05-07 DIAGNOSIS — F32A Depression, unspecified: Secondary | ICD-10-CM

## 2014-05-07 DIAGNOSIS — E038 Other specified hypothyroidism: Secondary | ICD-10-CM

## 2014-05-07 DIAGNOSIS — Z1212 Encounter for screening for malignant neoplasm of rectum: Secondary | ICD-10-CM

## 2014-05-07 DIAGNOSIS — E559 Vitamin D deficiency, unspecified: Secondary | ICD-10-CM | POA: Diagnosis not present

## 2014-05-07 LAB — LIPID PANEL
HDL: 90 mg/dL — AB (ref 35–70)
LDL Cholesterol: 136 mg/dL
Triglycerides: 56 mg/dL (ref 40–160)

## 2014-05-07 MED ORDER — LEVOTHYROXINE SODIUM 200 MCG PO TABS: 200.0000 ug | ORAL_TABLET | Freq: Every day | ORAL | Status: DC

## 2014-05-07 MED ORDER — IVERMECTIN 3 MG PO TABS: ORAL_TABLET | ORAL | Status: DC

## 2014-05-07 MED ORDER — BUPROPION HCL 100 MG PO TABS: 100.0000 mg | ORAL_TABLET | Freq: Two times a day (BID) | ORAL | Status: DC

## 2014-05-07 MED ORDER — CHLORDIAZEPOXIDE HCL 25 MG PO CAPS: ORAL_CAPSULE | ORAL | Status: DC

## 2014-05-07 NOTE — Progress Notes (Signed)
Subjective:    Patient ID: Kimberly Roth, female    DOB: June 24, 1946, 68 y.o.   MRN: [id number]  Patient here today for follow up of chronic medical problems.  Thyroid Problem Presents for initial visit. Symptoms include anxiety and depressed mood. Patient reports no fatigue, hair loss, heat intolerance, hoarse voice, palpitations or weight loss. The symptoms have been resolved. Past treatments include levothyroxine. The treatment provided significant relief. There is no history of hyperlipidemia or obesity.  Anxiety Presents for initial visit. Onset was more than 5 years ago. The problem has been resolved. Symptoms include depressed mood. Patient reports no insomnia, irritability, nervous/anxious behavior or palpitations. Symptoms occur rarely. The quality of sleep is fair. Nighttime awakenings: one to two.   Her past medical history is significant for depression. There is no history of bipolar disorder. Past treatments include SSRIs. The treatment provided significant relief. Compliance with prior treatments has been good.   Depression Pt on Wellbutrin 100mg - pt states this works well for her and has no complaints as long as she takes her medication.She also takes librium which keeps her calm.   Review of Systems  Constitutional: Negative for weight loss, irritability and fatigue.  HENT: Negative for hoarse voice.   Cardiovascular: Negative for palpitations.  Endocrine: Negative for heat intolerance.  Psychiatric/Behavioral: The patient is not nervous/anxious and does not have insomnia.   All other systems reviewed and are negative.      Objective:   Physical Exam  Constitutional: She is oriented to person, place, and time. She appears well-developed and well-nourished.  HENT:  Nose: Nose normal.  Mouth/Throat: Oropharynx is clear and moist.  Eyes: EOM are normal.  Neck: Trachea normal, normal range of motion and full passive range of motion without pain. Neck supple. No JVD  present. Carotid bruit is not present. No thyromegaly present.  Cardiovascular: Normal rate, regular rhythm, normal heart sounds and intact distal pulses.  Exam reveals no gallop and no friction rub.   No murmur heard. Pulmonary/Chest: Effort normal and breath sounds normal.  Abdominal: Soft. Bowel sounds are normal. She exhibits no distension and no mass. There is no tenderness.  Musculoskeletal: Normal range of motion.  Lymphadenopathy:    She has no cervical adenopathy.  Neurological: She is alert and oriented to person, place, and time. She has normal reflexes.  Skin: Skin is warm and dry.  Psychiatric: She has a normal mood and affect. Her behavior is normal. Judgment and thought content normal.      BP 137/78  Pulse 79  Temp(Src) 98.1 F (36.7 C) (Oral)  Ht 5\' 3"  (1.6 m)  Wt 134 lb 12.8 oz (61.145 kg)  BMI 23.88 kg/m2     Assessment & Plan:   1. Hypothyroidism due to acquired atrophy of thyroid  - levothyroxine (SYNTHROID) 200 MCG tablet; Take 1 tablet (200 mcg total) by mouth daily before breakfast.  Dispense: 30 tablet; Refill: 5  2. GAD (generalized anxiety disorder) Stress  management  3. Depression  - buPROPion (WELLBUTRIN) 100 MG tablet; Take 1 tablet (100 mg total) by mouth 2 (two) times daily. 2 tabs in am and one in pm  Dispense: 270 tablet; Refill: 1 - chlordiazePOXIDE (LIBRIUM) 25 MG capsule; TAKE ONE CAPSULE BY MOUTH TWICE A DAY  Dispense: 60 capsule; Refill: 5  4. Menopausal state - DG Bone Density; Future   hemoccult cards given to patient with directions Labs pending Health maintenance reviewed Diet and exercise encouraged Continue all meds Follow up  In 6 month   [place], FNP

## 2014-05-07 NOTE — Addendum Note (Signed)
Addended by: [name] on: 05/07/2014 03:21 PM   Modules accepted: Orders

## 2014-05-07 NOTE — Patient Instructions (Signed)

## 2014-05-08 ENCOUNTER — Other Ambulatory Visit: Payer: Self-pay | Admitting: Nurse Practitioner

## 2014-05-08 DIAGNOSIS — E034 Atrophy of thyroid (acquired): Secondary | ICD-10-CM

## 2014-05-08 LAB — CMP14+EGFR
A/G RATIO: 2.3 (ref 1.1–2.5)
ALT: 17 IU/L (ref 0–32)
AST: 19 IU/L (ref 0–40)
Albumin: 4.4 g/dL (ref 3.6–4.8)
Alkaline Phosphatase: 71 IU/L (ref 39–117)
BUN/Creatinine Ratio: 10 — ABNORMAL LOW (ref 11–26)
BUN: 8 mg/dL (ref 8–27)
CHLORIDE: 101 mmol/L (ref 97–108)
CO2: 23 mmol/L (ref 18–29)
Calcium: 9.1 mg/dL (ref 8.7–10.3)
Creatinine, Ser: 0.8 mg/dL (ref 0.57–1.00)
GFR calc Af Amer: 88 mL/min/{1.73_m2} (ref >59)
GFR, EST NON AFRICAN AMERICAN: 77 mL/min/{1.73_m2} (ref >59)
Globulin, Total: 1.9 g/dL (ref 1.5–4.5)
Glucose: 93 mg/dL (ref 65–99)
Potassium: 4.3 mmol/L (ref 3.5–5.2)
SODIUM: 140 mmol/L (ref 134–144)
Total Protein: 6.3 g/dL (ref 6.0–8.5)

## 2014-05-08 LAB — NMR, LIPOPROFILE
Cholesterol: 237 mg/dL — ABNORMAL HIGH (ref 100–199)
HDL Cholesterol by NMR: 90 mg/dL (ref >39)
HDL Particle Number: 44.3 umol/L (ref >=30.5)
LDL Particle Number: 1151 nmol/L — ABNORMAL HIGH (ref <1000)
LDL SIZE: 21.7 nm (ref >20.5)
LDLC SERPL CALC-MCNC: 136 mg/dL — ABNORMAL HIGH (ref 0–99)
LP-IR Score: <25 (ref <=45)
Triglycerides by NMR: 56 mg/dL (ref 0–149)

## 2014-05-08 LAB — THYROID PANEL WITH TSH
Free Thyroxine Index: 3.4 (ref 1.2–4.9)
T3 Uptake Ratio: 29 % (ref 24–39)
T4, Total: 11.8 ug/dL (ref 4.5–12.0)
TSH: 0.062 u[IU]/mL — AB (ref 0.450–4.500)

## 2014-05-08 LAB — VITAMIN D 25 HYDROXY (VIT D DEFICIENCY, FRACTURES): VIT D 25 HYDROXY: 48.8 ng/mL (ref 30.0–100.0)

## 2014-05-08 MED ORDER — LEVOTHYROXINE SODIUM 175 MCG PO TABS: 175.0000 ug | ORAL_TABLET | Freq: Every day | ORAL | Status: DC

## 2014-05-11 ENCOUNTER — Telehealth: Payer: Self-pay | Admitting: Family Medicine

## 2014-05-11 NOTE — Telephone Encounter (Signed)
Message copied by Cline Crock on Mon May 11, 2014  2:38 PM ------      Message from: [name]      Created: Fri May 08, 2014  4:30 PM       Kidney and liver function stable      LDL particle numbers are elevated but LDL are high- needs to be on statin- will agree to start zocor      TSH is low- decreased dose of levothyroxin to 122mcg daily- rx sent to pharmacy- repeat labs in 6 weeks      Vit d normal      Continue current meds- low fat diet and exercise and recheck in 3 months       ------

## 2014-05-12 NOTE — Telephone Encounter (Signed)
Message copied by Cline Crock on Tue May 12, 2014 11:58 AM ------      Message from: [name]      Created: Fri May 08, 2014  4:30 PM       Kidney and liver function stable      LDL particle numbers are elevated but LDL are high- needs to be on statin- will agree to start zocor      TSH is low- decreased dose of levothyroxin to 196mcg daily- rx sent to pharmacy- repeat labs in 6 weeks      Vit d normal      Continue current meds- low fat diet and exercise and recheck in 3 months       ------

## 2014-05-14 ENCOUNTER — Other Ambulatory Visit: Payer: Self-pay | Admitting: Nurse Practitioner

## 2014-05-15 NOTE — Addendum Note (Signed)
Addended by: Earlene Plater on: 05/15/2014 08:45 AM   Modules accepted: Orders

## 2014-05-16 LAB — FECAL OCCULT BLOOD, IMMUNOCHEMICAL: Fecal Occult Bld: NEGATIVE

## 2014-05-16 LAB — PLEASE NOTE

## 2014-05-19 NOTE — Telephone Encounter (Signed)
Message copied by Waverly Ferrari on Tue May 19, 2014  9:43 AM ------      Message from: [name]      Created: Mon May 18, 2014  4:59 PM       Fecal occult negative ------

## 2014-05-19 NOTE — Telephone Encounter (Signed)
Message copied by Waverly Ferrari on Tue May 19, 2014  9:43 AM ------      Message from: [name]      Created: Fri May 08, 2014  4:30 PM       Kidney and liver function stable      LDL particle numbers are elevated but LDL are high- needs to be on statin- will agree to start zocor      TSH is low- decreased dose of levothyroxin to 1105mcg daily- rx sent to pharmacy- repeat labs in 6 weeks      Vit d normal      Continue current meds- low fat diet and exercise and recheck in 3 months       ------

## 2014-05-21 ENCOUNTER — Encounter: Payer: Self-pay | Admitting: *Deleted

## 2014-05-25 NOTE — Telephone Encounter (Signed)
Patient aware.

## 2014-06-01 DIAGNOSIS — M47812 Spondylosis without myelopathy or radiculopathy, cervical region: Secondary | ICD-10-CM | POA: Diagnosis not present

## 2014-06-01 DIAGNOSIS — G441 Vascular headache, not elsewhere classified: Secondary | ICD-10-CM | POA: Diagnosis not present

## 2014-06-01 DIAGNOSIS — M9901 Segmental and somatic dysfunction of cervical region: Secondary | ICD-10-CM | POA: Diagnosis not present

## 2014-06-02 ENCOUNTER — Telehealth: Payer: Self-pay | Admitting: Nurse Practitioner

## 2014-06-02 ENCOUNTER — Other Ambulatory Visit: Payer: Self-pay | Admitting: Nurse Practitioner

## 2014-06-02 ENCOUNTER — Ambulatory Visit: Payer: Medicare Other | Admitting: Nurse Practitioner

## 2014-06-02 MED ORDER — SIMVASTATIN 40 MG PO TABS: 40.0000 mg | ORAL_TABLET | Freq: Every day | ORAL | Status: DC

## 2014-06-02 NOTE — Telephone Encounter (Signed)
Pt went to ER for UTI

## 2014-06-02 NOTE — Telephone Encounter (Signed)
Need triage to make an appointment for UTI per MMM.

## 2014-06-02 NOTE — Telephone Encounter (Signed)
Needs to be seen for uti

## 2014-06-02 NOTE — Telephone Encounter (Signed)
NTBS.

## 2014-06-03 ENCOUNTER — Ambulatory Visit: Payer: Medicare Other | Admitting: Nurse Practitioner

## 2014-06-10 DIAGNOSIS — M47812 Spondylosis without myelopathy or radiculopathy, cervical region: Secondary | ICD-10-CM | POA: Diagnosis not present

## 2014-06-10 DIAGNOSIS — M9901 Segmental and somatic dysfunction of cervical region: Secondary | ICD-10-CM | POA: Diagnosis not present

## 2014-06-10 DIAGNOSIS — G441 Vascular headache, not elsewhere classified: Secondary | ICD-10-CM | POA: Diagnosis not present

## 2014-06-15 DIAGNOSIS — M47812 Spondylosis without myelopathy or radiculopathy, cervical region: Secondary | ICD-10-CM | POA: Diagnosis not present

## 2014-06-15 DIAGNOSIS — M9901 Segmental and somatic dysfunction of cervical region: Secondary | ICD-10-CM | POA: Diagnosis not present

## 2014-06-15 DIAGNOSIS — G441 Vascular headache, not elsewhere classified: Secondary | ICD-10-CM | POA: Diagnosis not present

## 2014-06-30 ENCOUNTER — Telehealth: Payer: Self-pay | Admitting: Nurse Practitioner

## 2014-06-30 DIAGNOSIS — F329 Major depressive disorder, single episode, unspecified: Secondary | ICD-10-CM

## 2014-06-30 DIAGNOSIS — M47812 Spondylosis without myelopathy or radiculopathy, cervical region: Secondary | ICD-10-CM | POA: Diagnosis not present

## 2014-06-30 DIAGNOSIS — G441 Vascular headache, not elsewhere classified: Secondary | ICD-10-CM | POA: Diagnosis not present

## 2014-06-30 DIAGNOSIS — F32A Depression, unspecified: Secondary | ICD-10-CM

## 2014-06-30 DIAGNOSIS — M9901 Segmental and somatic dysfunction of cervical region: Secondary | ICD-10-CM | POA: Diagnosis not present

## 2014-06-30 MED ORDER — CHLORDIAZEPOXIDE HCL 25 MG PO CAPS: ORAL_CAPSULE | ORAL | Status: DC

## 2014-06-30 NOTE — Telephone Encounter (Signed)
Librium rx sent to  pharmacy

## 2014-07-07 ENCOUNTER — Other Ambulatory Visit (INDEPENDENT_AMBULATORY_CARE_PROVIDER_SITE_OTHER): Payer: Medicare Other

## 2014-07-07 DIAGNOSIS — E0789 Other specified disorders of thyroid: Secondary | ICD-10-CM | POA: Diagnosis not present

## 2014-07-07 DIAGNOSIS — E038 Other specified hypothyroidism: Secondary | ICD-10-CM | POA: Diagnosis not present

## 2014-07-07 DIAGNOSIS — E034 Atrophy of thyroid (acquired): Secondary | ICD-10-CM

## 2014-07-07 NOTE — Progress Notes (Signed)
Lab only 

## 2014-07-08 ENCOUNTER — Other Ambulatory Visit: Payer: Self-pay | Admitting: Nurse Practitioner

## 2014-07-08 LAB — THYROID PANEL WITH TSH
Free Thyroxine Index: 3.8 (ref 1.2–4.9)
T3 UPTAKE RATIO: 32 % (ref 24–39)
T4 TOTAL: 12 ug/dL (ref 4.5–12.0)
TSH: 0.014 u[IU]/mL — ABNORMAL LOW (ref 0.450–4.500)

## 2014-07-08 MED ORDER — LEVOTHYROXINE SODIUM 150 MCG PO TABS: 150.0000 ug | ORAL_TABLET | Freq: Every day | ORAL | Status: DC

## 2014-07-08 MED ORDER — LEVOTHYROXINE SODIUM 175 MCG PO TABS: 175.0000 ug | ORAL_TABLET | Freq: Every day | ORAL | Status: DC

## 2014-07-27 DIAGNOSIS — M9901 Segmental and somatic dysfunction of cervical region: Secondary | ICD-10-CM | POA: Diagnosis not present

## 2014-07-27 DIAGNOSIS — M47812 Spondylosis without myelopathy or radiculopathy, cervical region: Secondary | ICD-10-CM | POA: Diagnosis not present

## 2014-07-27 DIAGNOSIS — G441 Vascular headache, not elsewhere classified: Secondary | ICD-10-CM | POA: Diagnosis not present

## 2014-08-12 ENCOUNTER — Ambulatory Visit: Payer: Medicare Other

## 2014-08-12 ENCOUNTER — Other Ambulatory Visit: Payer: Medicare Other

## 2014-08-14 ENCOUNTER — Ambulatory Visit (INDEPENDENT_AMBULATORY_CARE_PROVIDER_SITE_OTHER): Payer: Medicare Other | Admitting: *Deleted

## 2014-08-14 DIAGNOSIS — Z0184 Encounter for antibody response examination: Secondary | ICD-10-CM

## 2014-08-14 DIAGNOSIS — Z1159 Encounter for screening for other viral diseases: Secondary | ICD-10-CM

## 2014-08-14 DIAGNOSIS — Z111 Encounter for screening for respiratory tuberculosis: Secondary | ICD-10-CM

## 2014-08-14 NOTE — Progress Notes (Signed)
Ppd test was placed on left forearm and tolerated well. Patient also needed a varicella titer and order was placed for patient.

## 2014-08-14 NOTE — Patient Instructions (Signed)

## 2014-08-15 LAB — VARICELLA ZOSTER ANTIBODY, IGG: Varicella zoster IgG: 207 index (ref >165)

## 2014-08-17 LAB — TB SKIN TEST
INDURATION: 0 mm
TB Skin Test: NEGATIVE

## 2014-09-02 ENCOUNTER — Ambulatory Visit (INDEPENDENT_AMBULATORY_CARE_PROVIDER_SITE_OTHER): Payer: Medicare Other | Admitting: Pharmacist

## 2014-09-02 ENCOUNTER — Encounter: Payer: Self-pay | Admitting: Pharmacist

## 2014-09-02 ENCOUNTER — Ambulatory Visit (INDEPENDENT_AMBULATORY_CARE_PROVIDER_SITE_OTHER): Payer: Medicare Other

## 2014-09-02 VITALS — BP 124/80 | HR 76 | Ht 64.0 in | Wt 131.0 lb

## 2014-09-02 DIAGNOSIS — Z0000 Encounter for general adult medical examination without abnormal findings: Secondary | ICD-10-CM | POA: Diagnosis not present

## 2014-09-02 DIAGNOSIS — N951 Menopausal and female climacteric states: Secondary | ICD-10-CM

## 2014-09-02 DIAGNOSIS — E785 Hyperlipidemia, unspecified: Secondary | ICD-10-CM

## 2014-09-02 DIAGNOSIS — M329 Systemic lupus erythematosus, unspecified: Secondary | ICD-10-CM | POA: Insufficient documentation

## 2014-09-02 NOTE — Patient Instructions (Signed)
Fall Prevention and Home Safety Falls cause injuries and can affect all age groups. It is possible to use preventive measures to significantly decrease the likelihood of falls. There are many simple measures which can make your home safer and prevent falls. OUTDOORS  Repair cracks and edges of walkways and driveways.  Remove high doorway thresholds.  Trim shrubbery on the main path into your home.  Have good outside lighting.  Clear walkways of tools, rocks, debris, and clutter.  Check that handrails are not broken and are securely fastened. Both sides of steps should have handrails.  Have leaves, snow, and ice cleared regularly.  Use sand or salt on walkways during winter months.  In the garage, clean up grease or oil spills. BATHROOM  Install night lights.  Install grab bars by the toilet and in the tub and shower.  Use non-skid mats or decals in the tub or shower.  Place a plastic non-slip stool in the shower to sit on, if needed.  Keep floors dry and clean up all water on the floor immediately.  Remove soap buildup in the tub or shower on a regular basis.  Secure bath mats with non-slip, double-sided rug tape.  Remove throw rugs and tripping hazards from the floors. BEDROOMS  Install night lights.  Make sure a bedside light is easy to reach.  Do not use oversized bedding.  Keep a telephone by your bedside.  Have a firm chair with side arms to use for getting dressed.  Remove throw rugs and tripping hazards from the floor. KITCHEN  Keep handles on pots and pans turned toward the center of the stove. Use back burners when possible.  Clean up spills quickly and allow time for drying.  Avoid walking on wet floors.  Avoid hot utensils and knives.  Position shelves so they are not too high or low.  Place commonly used objects within easy reach.  If necessary, use a sturdy step stool with a grab bar when reaching.  Keep electrical cables out of the  way.  Do not use floor polish or wax that makes floors slippery. If you must use wax, use non-skid floor wax.  Remove throw rugs and tripping hazards from the floor. STAIRWAYS  Never leave objects on stairs.  Place handrails on both sides of stairways and use them. Fix any loose handrails. Make sure handrails on both sides of the stairways are as long as the stairs.  Check carpeting to make sure it is firmly attached along stairs. Make repairs to worn or loose carpet promptly.  Avoid placing throw rugs at the top or bottom of stairways, or properly secure the rug with carpet tape to prevent slippage. Get rid of throw rugs, if possible.  Have an electrician put in a light switch at the top and bottom of the stairs. OTHER FALL PREVENTION TIPS  Wear low-heel or rubber-soled shoes that are supportive and fit well. Wear closed toe shoes.  When using a stepladder, make sure it is fully opened and both spreaders are firmly locked. Do not climb a closed stepladder.  Add color or contrast paint or tape to grab bars and handrails in your home. Place contrasting color strips on first and last steps.  Learn and use mobility aids as needed. Install an electrical emergency response system.  Turn on lights to avoid dark areas. Replace light bulbs that burn out immediately. Get light switches that glow.  Arrange furniture to create clear pathways. Keep furniture in the same place.    Firmly attach carpet with non-skid or double-sided tape.  Eliminate uneven floor surfaces.  Select a carpet pattern that does not visually hide the edge of steps.  Be aware of all pets. OTHER HOME SAFETY TIPS  Set the water temperature for 120 F (48.8 C).  Keep emergency numbers on or near the telephone.  Keep smoke detectors on every level of the home and near sleeping areas. Document Released: 07/07/2002 Document Revised: 01/16/2012 Document Reviewed: 10/06/2011 [employer] Patient Information 2015  [employer]. This information is not intended to replace advice given to you by your health care provider. Make sure you discuss any questions you have with your health care provider.    Preventive Care for Adults A healthy lifestyle and preventive care can promote health and wellness. Preventive health guidelines for women include the following key practices.  A routine yearly physical is a good way to check with your health care provider about your health and preventive screening. It is a chance to share any concerns and updates on your health and to receive a thorough exam.  Visit your dentist for a routine exam and preventive care every 6 months. Brush your teeth twice a day and floss once a day. Good oral hygiene prevents tooth decay and gum disease.  The frequency of eye exams is based on your age, health, family medical history, use of contact lenses, and other factors. Follow your health care provider's recommendations for frequency of eye exams.  Eat a healthy diet. Foods like vegetables, fruits, whole grains, low-fat dairy products, and lean protein foods contain the nutrients you need without too many calories. Decrease your intake of foods high in solid fats, added sugars, and salt. Eat the right amount of calories for you.Get information about a proper diet from your health care provider, if necessary.  Regular physical exercise is one of the most important things you can do for your health. Most adults should get at least 150 minutes of moderate-intensity exercise (any activity that increases your heart rate and causes you to sweat) each week. In addition, most adults need muscle-strengthening exercises on 2 or more days a week.  Maintain a healthy weight. The body mass index (BMI) is a screening tool to identify possible weight problems. It provides an estimate of body fat based on height and weight. Your health care provider can find your BMI and can help you achieve or maintain a  healthy weight.For adults 20 years and older:  A BMI below 18.5 is considered underweight.  A BMI of 18.5 to 24.9 is normal.  A BMI of 25 to 29.9 is considered overweight.  A BMI of 30 and above is considered obese.  Maintain normal blood lipids and cholesterol levels by exercising and minimizing your intake of saturated fat. Eat a balanced diet with plenty of fruit and vegetables. Blood tests for lipids and cholesterol should begin at age 20 and be repeated every 5 years. If your lipid or cholesterol levels are high, you are over 50, or you are at high risk for heart disease, you may need your cholesterol levels checked more frequently.Ongoing high lipid and cholesterol levels should be treated with medicines if diet and exercise are not working.  If you smoke, find out from your health care provider how to quit. If you do not use tobacco, do not start.  Lung cancer screening is recommended for adults aged 55-80 years who are at high risk for developing lung cancer because of a history of smoking. A   yearly low-dose CT scan of the lungs is recommended for people who have at least a 30-pack-year history of smoking and are a current smoker or have quit within the past 15 years. A pack year of smoking is smoking an average of 1 pack of cigarettes a day for 1 year (for example: 1 pack a day for 30 years or 2 packs a day for 15 years). Yearly screening should continue until the smoker has stopped smoking for at least 15 years. Yearly screening should be stopped for people who develop a health problem that would prevent them from having lung cancer treatment.  If you are pregnant, do not drink alcohol. If you are breastfeeding, be very cautious about drinking alcohol. If you are not pregnant and choose to drink alcohol, do not have more than 1 drink per day. One drink is considered to be 12 ounces (355 mL) of beer, 5 ounces (148 mL) of wine, or 1.5 ounces (44 mL) of liquor.  Avoid use of street drugs.  Do not share needles with anyone. Ask for help if you need support or instructions about stopping the use of drugs.  High blood pressure causes heart disease and increases the risk of stroke. Your blood pressure should be checked at least every 1 to 2 years. Ongoing high blood pressure should be treated with medicines if weight loss and exercise do not work.  If you are 55-79 years old, ask your health care provider if you should take aspirin to prevent strokes.  Diabetes screening involves taking a blood sample to check your fasting blood sugar level. This should be done once every 3 years, after age 45, if you are within normal weight and without risk factors for diabetes. Testing should be considered at a younger age or be carried out more frequently if you are overweight and have at least 1 risk factor for diabetes.  Breast cancer screening is essential preventive care for women. You should practice "breast self-awareness." This means understanding the normal appearance and feel of your breasts and may include breast self-examination. Any changes detected, no matter how small, should be reported to a health care provider. Women in their 20s and 30s should have a clinical breast exam (CBE) by a health care provider as part of a regular health exam every 1 to 3 years. After age 40, women should have a CBE every year. Starting at age 40, women should consider having a mammogram (breast X-ray test) every year. Women who have a family history of breast cancer should talk to their health care provider about genetic screening. Women at a high risk of breast cancer should talk to their health care providers about having an MRI and a mammogram every year.  Breast cancer gene (BRCA)-related cancer risk assessment is recommended for women who have family members with BRCA-related cancers. BRCA-related cancers include breast, ovarian, tubal, and peritoneal cancers. Having family members with these cancers may be  associated with an increased risk for harmful changes (mutations) in the breast cancer genes BRCA1 and BRCA2. Results of the assessment will determine the need for genetic counseling and BRCA1 and BRCA2 testing.  Routine pelvic exams to screen for cancer are no longer recommended for nonpregnant women who are considered low risk for cancer of the pelvic organs (ovaries, uterus, and vagina) and who do not have symptoms. Ask your health care provider if a screening pelvic exam is right for you.  If you have had past treatment for cervical cancer or a   condition that could lead to cancer, you need Pap tests and screening for cancer for at least 20 years after your treatment. If Pap tests have been discontinued, your risk factors (such as having a new sexual partner) need to be reassessed to determine if screening should be resumed. Some women have medical problems that increase the chance of getting cervical cancer. In these cases, your health care provider may recommend more frequent screening and Pap tests.  The HPV test is an additional test that may be used for cervical cancer screening. The HPV test looks for the virus that can cause the cell changes on the cervix. The cells collected during the Pap test can be tested for HPV. The HPV test could be used to screen women aged 30 years and older, and should be used in women of any age who have unclear Pap test results. After the age of 30, women should have HPV testing at the same frequency as a Pap test.  Colorectal cancer can be detected and often prevented. Most routine colorectal cancer screening begins at the age of 50 years and continues through age 75 years. However, your health care provider may recommend screening at an earlier age if you have risk factors for colon cancer. On a yearly basis, your health care provider may provide home test kits to check for hidden blood in the stool. Use of a small camera at the end of a tube, to directly examine the  colon (sigmoidoscopy or colonoscopy), can detect the earliest forms of colorectal cancer. Talk to your health care provider about this at age 50, when routine screening begins. Direct exam of the colon should be repeated every 5-10 years through age 75 years, unless early forms of pre-cancerous polyps or small growths are found.  People who are at an increased risk for hepatitis B should be screened for this virus. You are considered at high risk for hepatitis B if:  You were born in a country where hepatitis B occurs often. Talk with your health care provider about which countries are considered high risk.  Your parents were born in a high-risk country and you have not received a shot to protect against hepatitis B (hepatitis B vaccine).  You have HIV or AIDS.  You use needles to inject street drugs.  You live with, or have sex with, someone who has hepatitis B.  You get hemodialysis treatment.  You take certain medicines for conditions like cancer, organ transplantation, and autoimmune conditions.  Hepatitis C blood testing is recommended for all people born from 1945 through 1965 and any individual with known risks for hepatitis C.  Practice safe sex. Use condoms and avoid high-risk sexual practices to reduce the spread of sexually transmitted infections (STIs). STIs include gonorrhea, chlamydia, syphilis, trichomonas, herpes, HPV, and human immunodeficiency virus (HIV). Herpes, HIV, and HPV are viral illnesses that have no cure. They can result in disability, cancer, and death.  You should be screened for sexually transmitted illnesses (STIs) including gonorrhea and chlamydia if:  You are sexually active and are younger than 24 years.  You are older than 24 years and your health care provider tells you that you are at risk for this type of infection.  Your sexual activity has changed since you were last screened and you are at an increased risk for chlamydia or gonorrhea. Ask your  health care provider if you are at risk.  If you are at risk of being infected with HIV, it is recommended   that you take a prescription medicine daily to prevent HIV infection. This is called preexposure prophylaxis (PrEP). You are considered at risk if:  You are a heterosexual woman, are sexually active, and are at increased risk for HIV infection.  You take drugs by injection.  You are sexually active with a partner who has HIV.  Talk with your health care provider about whether you are at high risk of being infected with HIV. If you choose to begin PrEP, you should first be tested for HIV. You should then be tested every 3 months for as long as you are taking PrEP.  Osteoporosis is a disease in which the bones lose minerals and strength with aging. This can result in serious bone fractures or breaks. The risk of osteoporosis can be identified using a bone density scan. Women ages 65 years and over and women at risk for fractures or osteoporosis should discuss screening with their health care providers. Ask your health care provider whether you should take a calcium supplement or vitamin D to reduce the rate of osteoporosis.  Menopause can be associated with physical symptoms and risks. Hormone replacement therapy is available to decrease symptoms and risks. You should talk to your health care provider about whether hormone replacement therapy is right for you.  Use sunscreen. Apply sunscreen liberally and repeatedly throughout the day. You should seek shade when your shadow is shorter than you. Protect yourself by wearing long sleeves, pants, a wide-brimmed hat, and sunglasses year round, whenever you are outdoors.  Once a month, do a whole body skin exam, using a mirror to look at the skin on your back. Tell your health care provider of new moles, moles that have irregular borders, moles that are larger than a pencil eraser, or moles that have changed in shape or color.  Stay current with  required vaccines (immunizations).  Influenza vaccine. All adults should be immunized every year.  Tetanus, diphtheria, and acellular pertussis (Td, Tdap) vaccine. Pregnant women should receive 1 dose of Tdap vaccine during each pregnancy. The dose should be obtained regardless of the length of time since the last dose. Immunization is preferred during the 27th-36th week of gestation. An adult who has not previously received Tdap or who does not know her vaccine status should receive 1 dose of Tdap. This initial dose should be followed by tetanus and diphtheria toxoids (Td) booster doses every 10 years. Adults with an unknown or incomplete history of completing a 3-dose immunization series with Td-containing vaccines should begin or complete a primary immunization series including a Tdap dose. Adults should receive a Td booster every 10 years.  Varicella vaccine. An adult without evidence of immunity to varicella should receive 2 doses or a second dose if she has previously received 1 dose. Pregnant females who do not have evidence of immunity should receive the first dose after pregnancy. This first dose should be obtained before leaving the health care facility. The second dose should be obtained 4-8 weeks after the first dose.  Human papillomavirus (HPV) vaccine. Females aged 13-26 years who have not received the vaccine previously should obtain the 3-dose series. The vaccine is not recommended for use in pregnant females. However, pregnancy testing is not needed before receiving a dose. If a female is found to be pregnant after receiving a dose, no treatment is needed. In that case, the remaining doses should be delayed until after the pregnancy. Immunization is recommended for any person with an immunocompromised condition through the age   of 26 years if she did not get any or all doses earlier. During the 3-dose series, the second dose should be obtained 4-8 weeks after the first dose. The third dose  should be obtained 24 weeks after the first dose and 16 weeks after the second dose.  Zoster vaccine. One dose is recommended for adults aged 60 years or older unless certain conditions are present.  Measles, mumps, and rubella (MMR) vaccine. Adults born before 1957 generally are considered immune to measles and mumps. Adults born in 1957 or later should have 1 or more doses of MMR vaccine unless there is a contraindication to the vaccine or there is laboratory evidence of immunity to each of the three diseases. A routine second dose of MMR vaccine should be obtained at least 28 days after the first dose for students attending postsecondary schools, health care workers, or international travelers. People who received inactivated measles vaccine or an unknown type of measles vaccine during 1963-1967 should receive 2 doses of MMR vaccine. People who received inactivated mumps vaccine or an unknown type of mumps vaccine before 1979 and are at high risk for mumps infection should consider immunization with 2 doses of MMR vaccine. For females of childbearing age, rubella immunity should be determined. If there is no evidence of immunity, females who are not pregnant should be vaccinated. If there is no evidence of immunity, females who are pregnant should delay immunization until after pregnancy. Unvaccinated health care workers born before 1957 who lack laboratory evidence of measles, mumps, or rubella immunity or laboratory confirmation of disease should consider measles and mumps immunization with 2 doses of MMR vaccine or rubella immunization with 1 dose of MMR vaccine.  Pneumococcal 13-valent conjugate (PCV13) vaccine. When indicated, a person who is uncertain of her immunization history and has no record of immunization should receive the PCV13 vaccine. An adult aged 19 years or older who has certain medical conditions and has not been previously immunized should receive 1 dose of PCV13 vaccine. This PCV13  should be followed with a dose of pneumococcal polysaccharide (PPSV23) vaccine. The PPSV23 vaccine dose should be obtained at least 8 weeks after the dose of PCV13 vaccine. An adult aged 19 years or older who has certain medical conditions and previously received 1 or more doses of PPSV23 vaccine should receive 1 dose of PCV13. The PCV13 vaccine dose should be obtained 1 or more years after the last PPSV23 vaccine dose.  Pneumococcal polysaccharide (PPSV23) vaccine. When PCV13 is also indicated, PCV13 should be obtained first. All adults aged 65 years and older should be immunized. An adult younger than age 65 years who has certain medical conditions should be immunized. Any person who resides in a nursing home or long-term care facility should be immunized. An adult smoker should be immunized. People with an immunocompromised condition and certain other conditions should receive both PCV13 and PPSV23 vaccines. People with human immunodeficiency virus (HIV) infection should be immunized as soon as possible after diagnosis. Immunization during chemotherapy or radiation therapy should be avoided. Routine use of PPSV23 vaccine is not recommended for American Indians, [place] Natives, or people younger than 65 years unless there are medical conditions that require PPSV23 vaccine. When indicated, people who have unknown immunization and have no record of immunization should receive PPSV23 vaccine. One-time revaccination 5 years after the first dose of PPSV23 is recommended for people aged 19-64 years who have chronic kidney failure, nephrotic syndrome, asplenia, or immunocompromised conditions. People who received 1-2 doses   of PPSV23 before age 65 years should receive another dose of PPSV23 vaccine at age 65 years or later if at least 5 years have passed since the previous dose. Doses of PPSV23 are not needed for people immunized with PPSV23 at or after age 65 years.  Meningococcal vaccine. Adults with asplenia or  persistent complement component deficiencies should receive 2 doses of quadrivalent meningococcal conjugate (MenACWY-D) vaccine. The doses should be obtained at least 2 months apart. Microbiologists working with certain meningococcal bacteria, military recruits, people at risk during an outbreak, and people who travel to or live in countries with a high rate of meningitis should be immunized. A first-year college student up through age 21 years who is living in a residence hall should receive a dose if she did not receive a dose on or after her 16th birthday. Adults who have certain high-risk conditions should receive one or more doses of vaccine.  Hepatitis A vaccine. Adults who wish to be protected from this disease, have certain high-risk conditions, work with hepatitis A-infected animals, work in hepatitis A research labs, or travel to or work in countries with a high rate of hepatitis A should be immunized. Adults who were previously unvaccinated and who anticipate close contact with an international adoptee during the first 60 days after arrival in the [place] from a country with a high rate of hepatitis A should be immunized.  Hepatitis B vaccine. Adults who wish to be protected from this disease, have certain high-risk conditions, may be exposed to blood or other infectious body fluids, are household contacts or sex partners of hepatitis B positive people, are clients or workers in certain care facilities, or travel to or work in countries with a high rate of hepatitis B should be immunized.  Haemophilus influenzae type b (Hib) vaccine. A previously unvaccinated person with asplenia or sickle cell disease or having a scheduled splenectomy should receive 1 dose of Hib vaccine. Regardless of previous immunization, a recipient of a hematopoietic stem cell transplant should receive a 3-dose series 6-12 months after her successful transplant. Hib vaccine is not recommended for adults with HIV  infection. Preventive Services / Frequency Ages 19 to 39 years  Blood pressure check.** / Every 1 to 2 years.  Lipid and cholesterol check.** / Every 5 years beginning at age 20.  Clinical breast exam.** / Every 3 years for women in their 20s and 30s.  BRCA-related cancer risk assessment.** / For women who have family members with a BRCA-related cancer (breast, ovarian, tubal, or peritoneal cancers).  Pap test.** / Every 2 years from ages 21 through 29. Every 3 years starting at age 30 through age 65 or 70 with a history of 3 consecutive normal Pap tests.  HPV screening.** / Every 3 years from ages 30 through ages 65 to 70 with a history of 3 consecutive normal Pap tests.  Hepatitis C blood test.** / For any individual with known risks for hepatitis C.  Skin self-exam. / Monthly.  Influenza vaccine. / Every year.  Tetanus, diphtheria, and acellular pertussis (Tdap, Td) vaccine.** / Consult your health care provider. Pregnant women should receive 1 dose of Tdap vaccine during each pregnancy. 1 dose of Td every 10 years.  Varicella vaccine.** / Consult your health care provider. Pregnant females who do not have evidence of immunity should receive the first dose after pregnancy.  HPV vaccine. / 3 doses over 6 months, if 26 and younger. The vaccine is not recommended for use in pregnant   females. However, pregnancy testing is not needed before receiving a dose.  Measles, mumps, rubella (MMR) vaccine.** / You need at least 1 dose of MMR if you were born in 1957 or later. You may also need a 2nd dose. For females of childbearing age, rubella immunity should be determined. If there is no evidence of immunity, females who are not pregnant should be vaccinated. If there is no evidence of immunity, females who are pregnant should delay immunization until after pregnancy.  Pneumococcal 13-valent conjugate (PCV13) vaccine.** / Consult your health care provider.  Pneumococcal polysaccharide  (PPSV23) vaccine.** / 1 to 2 doses if you smoke cigarettes or if you have certain conditions.  Meningococcal vaccine.** / 1 dose if you are age 19 to 21 years and a first-year college student living in a residence hall, or have one of several medical conditions, you need to get vaccinated against meningococcal disease. You may also need additional booster doses.  Hepatitis A vaccine.** / Consult your health care provider.  Hepatitis B vaccine.** / Consult your health care provider.  Haemophilus influenzae type b (Hib) vaccine.** / Consult your health care provider. Ages 40 to 64 years  Blood pressure check.** / Every 1 to 2 years.  Lipid and cholesterol check.** / Every 5 years beginning at age 20 years.  Lung cancer screening. / Every year if you are aged 55-80 years and have a 30-pack-year history of smoking and currently smoke or have quit within the past 15 years. Yearly screening is stopped once you have quit smoking for at least 15 years or develop a health problem that would prevent you from having lung cancer treatment.  Clinical breast exam.** / Every year after age 40 years.  BRCA-related cancer risk assessment.** / For women who have family members with a BRCA-related cancer (breast, ovarian, tubal, or peritoneal cancers).  Mammogram.** / Every year beginning at age 40 years and continuing for as long as you are in good health. Consult with your health care provider.  Pap test.** / Every 3 years starting at age 30 years through age 65 or 70 years with a history of 3 consecutive normal Pap tests.  HPV screening.** / Every 3 years from ages 30 years through ages 65 to 70 years with a history of 3 consecutive normal Pap tests.  Fecal occult blood test (FOBT) of stool. / Every year beginning at age 50 years and continuing until age 75 years. You may not need to do this test if you get a colonoscopy every 10 years.  Flexible sigmoidoscopy or colonoscopy.** / Every 5 years for a  flexible sigmoidoscopy or every 10 years for a colonoscopy beginning at age 50 years and continuing until age 75 years.  Hepatitis C blood test.** / For all people born from 1945 through 1965 and any individual with known risks for hepatitis C.  Skin self-exam. / Monthly.  Influenza vaccine. / Every year.  Tetanus, diphtheria, and acellular pertussis (Tdap/Td) vaccine.** / Consult your health care provider. Pregnant women should receive 1 dose of Tdap vaccine during each pregnancy. 1 dose of Td every 10 years.  Varicella vaccine.** / Consult your health care provider. Pregnant females who do not have evidence of immunity should receive the first dose after pregnancy.  Zoster vaccine.** / 1 dose for adults aged 60 years or older.  Measles, mumps, rubella (MMR) vaccine.** / You need at least 1 dose of MMR if you were born in 1957 or later. You may also need a 2nd   dose. For females of childbearing age, rubella immunity should be determined. If there is no evidence of immunity, females who are not pregnant should be vaccinated. If there is no evidence of immunity, females who are pregnant should delay immunization until after pregnancy.  Pneumococcal 13-valent conjugate (PCV13) vaccine.** / Consult your health care provider.  Pneumococcal polysaccharide (PPSV23) vaccine.** / 1 to 2 doses if you smoke cigarettes or if you have certain conditions.  Meningococcal vaccine.** / Consult your health care provider.  Hepatitis A vaccine.** / Consult your health care provider.  Hepatitis B vaccine.** / Consult your health care provider.  Haemophilus influenzae type b (Hib) vaccine.** / Consult your health care provider. Ages 65 years and over  Blood pressure check.** / Every 1 to 2 years.  Lipid and cholesterol check.** / Every 5 years beginning at age 20 years.  Lung cancer screening. / Every year if you are aged 55-80 years and have a 30-pack-year history of smoking and currently smoke or have  quit within the past 15 years. Yearly screening is stopped once you have quit smoking for at least 15 years or develop a health problem that would prevent you from having lung cancer treatment.  Clinical breast exam.** / Every year after age 40 years.  BRCA-related cancer risk assessment.** / For women who have family members with a BRCA-related cancer (breast, ovarian, tubal, or peritoneal cancers).  Mammogram.** / Every year beginning at age 40 years and continuing for as long as you are in good health. Consult with your health care provider.  Pap test.** / Every 3 years starting at age 30 years through age 65 or 70 years with 3 consecutive normal Pap tests. Testing can be stopped between 65 and 70 years with 3 consecutive normal Pap tests and no abnormal Pap or HPV tests in the past 10 years.  HPV screening.** / Every 3 years from ages 30 years through ages 65 or 70 years with a history of 3 consecutive normal Pap tests. Testing can be stopped between 65 and 70 years with 3 consecutive normal Pap tests and no abnormal Pap or HPV tests in the past 10 years.  Fecal occult blood test (FOBT) of stool. / Every year beginning at age 50 years and continuing until age 75 years. You may not need to do this test if you get a colonoscopy every 10 years.  Flexible sigmoidoscopy or colonoscopy.** / Every 5 years for a flexible sigmoidoscopy or every 10 years for a colonoscopy beginning at age 50 years and continuing until age 75 years.  Hepatitis C blood test.** / For all people born from 1945 through 1965 and any individual with known risks for hepatitis C.  Osteoporosis screening.** / A one-time screening for women ages 65 years and over and women at risk for fractures or osteoporosis.  Skin self-exam. / Monthly.  Influenza vaccine. / Every year.  Tetanus, diphtheria, and acellular pertussis (Tdap/Td) vaccine.** / 1 dose of Td every 10 years.  Varicella vaccine.** / Consult your health care  provider.  Zoster vaccine.** / 1 dose for adults aged 60 years or older.  Pneumococcal 13-valent conjugate (PCV13) vaccine.** / Consult your health care provider.  Pneumococcal polysaccharide (PPSV23) vaccine.** / 1 dose for all adults aged 65 years and older.  Meningococcal vaccine.** / Consult your health care provider.  Hepatitis A vaccine.** / Consult your health care provider.  Hepatitis B vaccine.** / Consult your health care provider.  Haemophilus influenzae type b (Hib) vaccine.** / Consult   your health care provider. ** Family history and personal history of risk and conditions may change your health care provider's recommendations. Document Released: 09/12/2001 Document Revised: 12/01/2013 Document Reviewed: 12/12/2010 ExitCare Patient Information 2015 [employer]. This information is not intended to replace advice given to you by your health care provider. Make sure you discuss any questions you have with your health care provider.   

## 2014-09-02 NOTE — Progress Notes (Signed)
Patient ID: Kimberly Roth, female   DOB: 06-30-1946, 69 y.o.   MRN: [id number] Subjective:    Kimberly Roth is a 69 y.o. female who presents for Medicare Initial Wellness Visit and to discuss current DEXA results  Preventive Screening-Counseling & Management  Tobacco History  Smoking status  . Never Smoker   Smokeless tobacco  . Never Used    Current Problems (verified) Patient Active Problem List   Diagnosis Date Noted  . Systemic lupus 09/02/2014  . Hyperlipidemia 09/02/2014  . Hypothyroidism 04/09/2013  . Depression 04/09/2013  . GAD (generalized anxiety disorder) 04/09/2013    Medications Prior to Visit Current Outpatient Prescriptions on File Prior to Visit  Medication Sig Dispense Refill  . Ascorbic Acid (VITAMIN C) 1000 MG tablet Take 1,000 mg by mouth 2 (two) times daily.    Marland Kitchen aspirin 325 MG EC tablet Take 325 mg by mouth 2 (two) times daily.    Marland Kitchen BIOTIN PO Take 1 tablet by mouth 2 (two) times daily.    Marland Kitchen buPROPion (WELLBUTRIN) 100 MG tablet Take 1 tablet (100 mg total) by mouth 2 (two) times daily. 2 tabs in am and one in pm 270 tablet 1  . chlordiazePOXIDE (LIBRIUM) 25 MG capsule TAKE ONE CAPSULE BY MOUTH TWICE A DAY 180 capsule 1  . cholecalciferol (VITAMIN D) 1000 UNITS tablet Take 1,000 Units by mouth 2 (two) times daily.    Marland Kitchen CRANBERRY EXTRACT PO Take 1 tablet by mouth 2 (two) times daily.    Marland Kitchen levothyroxine (SYNTHROID, LEVOTHROID) 175 MCG tablet Take 1 tablet (175 mcg total) by mouth daily before breakfast. 30 tablet 5  . Multiple Vitamins-Minerals (MULTIVITAMINS THER. W/MINERALS) TABS tablet Take 1 tablet by mouth 2 (two) times daily.    . multivitamin-lutein (OCUVITE-LUTEIN) CAPS capsule Take 1 capsule by mouth 2 (two) times daily.    . Omega-3 Fatty Acids (OMEGA 3 PO) Take 1 capsule by mouth 2 (two) times daily.    . Potassium Gluconate 595 MG CAPS Take 1 capsule by mouth daily.    . Thiamine HCl (VITAMIN B-1) 250 MG tablet Take 250 mg by mouth 2 (two) times  daily.    . diphenhydrAMINE (BENADRYL) 25 MG tablet Take 50 mg by mouth at bedtime as needed for sleep.    Marland Kitchen ivermectin (STROMECTOL) 3 MG TABS tablet 1 po by mouth monthly (Patient not taking: Reported on 09/02/2014) 4 tablet 3   No current facility-administered medications on file prior to visit.    Current Medications (verified) Current Outpatient Prescriptions  Medication Sig Dispense Refill  . Ascorbic Acid (VITAMIN C) 1000 MG tablet Take 1,000 mg by mouth 2 (two) times daily.    Marland Kitchen aspirin 325 MG EC tablet Take 325 mg by mouth 2 (two) times daily.    Marland Kitchen BIOTIN PO Take 1 tablet by mouth 2 (two) times daily.    Marland Kitchen buPROPion (WELLBUTRIN) 100 MG tablet Take 1 tablet (100 mg total) by mouth 2 (two) times daily. 2 tabs in am and one in pm 270 tablet 1  . calcium-vitamin D (OSCAL WITH D) 250-125 MG-UNIT per tablet Take 1 tablet by mouth 2 (two) times daily.    . chlordiazePOXIDE (LIBRIUM) 25 MG capsule TAKE ONE CAPSULE BY MOUTH TWICE A DAY 180 capsule 1  . cholecalciferol (VITAMIN D) 1000 UNITS tablet Take 1,000 Units by mouth 2 (two) times daily.    Marland Kitchen CRANBERRY EXTRACT PO Take 1 tablet by mouth 2 (two) times daily.    Marland Kitchen  levothyroxine (SYNTHROID, LEVOTHROID) 175 MCG tablet Take 1 tablet (175 mcg total) by mouth daily before breakfast. 30 tablet 5  . Multiple Vitamins-Minerals (MULTIVITAMINS THER. W/MINERALS) TABS tablet Take 1 tablet by mouth 2 (two) times daily.    . multivitamin-lutein (OCUVITE-LUTEIN) CAPS capsule Take 1 capsule by mouth 2 (two) times daily.    . Omega-3 Fatty Acids (OMEGA 3 PO) Take 1 capsule by mouth 2 (two) times daily.    . Potassium Gluconate 595 MG CAPS Take 1 capsule by mouth daily.    . Thiamine HCl (VITAMIN B-1) 250 MG tablet Take 250 mg by mouth 2 (two) times daily.    . diphenhydrAMINE (BENADRYL) 25 MG tablet Take 50 mg by mouth at bedtime as needed for sleep.    Marland Kitchen ivermectin (STROMECTOL) 3 MG TABS tablet 1 po by mouth monthly (Patient not taking: Reported on  09/02/2014) 4 tablet 3   No current facility-administered medications for this visit.     Allergies (verified) Codeine; Contrast media; Plaquenil; Tape; and Morphine and related   PAST HISTORY  Family History Family History  Problem Relation Age of Onset  . Heart failure Father   . Heart disease Father   . Heart attack Father 25  . Mental illness Mother   . Mental illness Sister     PTSD  . Osteoporosis Paternal Grandmother     Social History Patient is a traveling Marine scientist.  She only does within the [place] and changes locations several times per year.  History  Substance Use Topics  . Smoking status: Never Smoker   . Smokeless tobacco: Never Used  . Alcohol Use: 0.6 oz/week    1 Glasses of wine per week     Comment: 1 or 2 times per month     Are there smokers in your home (other than you)? No  Risk Factors Current exercise habits: Home exercise routine includes running most days of the week.  Dietary issues discussed: none   Cardiac risk factors: advanced age (older than 54 for men, 74 for women), dyslipidemia and family history of premature cardiovascular disease.  Depression Screen (Note: if answer to either of the following is "Yes", a more complete depression screening is indicated)   Over the past 2 weeks, have you felt down, depressed or hopeless? No  Over the past 2 weeks, have you felt little interest or pleasure in doing things? No  Have you lost interest or pleasure in daily life? No  Do you often feel hopeless? No  Do you cry easily over simple problems? No  Activities of Daily Living In your present state of health, do you have any difficulty performing the following activities?:  Driving? No Managing money?  No Feeding yourself? No Getting from bed to chair? No  Climbing a flight of stairs? No Preparing food and eating?: No Bathing or showering? No Getting dressed: No Getting to the toilet? 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     Are you sexually active?  Yes  Do you have more than one partner?  No  Hearing Difficulties: No Do you often ask people to speak up or repeat themselves? No Do you experience ringing or noises in your ears? No Do you have difficulty understanding soft or whispered voices? No   Do you feel that you have a problem  with memory? No  Do you often misplace items? No  Do you feel safe at home?  Yes  Cognitive Testing  Alert? Yes    Normal Appearance?Yes  Oriented to person? Yes    Place? Yes   Time? Yes  Recall of three objects?  Yes  Can perform simple calculations? Yes  Displays appropriate judgment?Yes  Can read the correct time from a watch face?Yes   Advanced Directives have been discussed with the patient? Yes  List the Names of Other Physician/Practitioners you currently use: 1.  opthalmologist - Dr Geoffry Paradise 2.  Chiropractor - Dr. Sandi Carne  Immunization History  Administered Date(s) Administered  . PPD Test 08/14/2014    Screening Tests Health Maintenance  Topic Date Due  . COLON CANCER SCREENING ANNUAL FOBT  03/19/1997  . DEXA SCAN  03/19/2012  . PNEUMOCOCCAL POLYSACCHARIDE VACCINE AGE 61 AND OVER  11/06/2014 (Originally 03/19/2012)  . ZOSTAVAX  11/06/2014 (Originally 03/20/2007)  . INFLUENZA VACCINE  05/08/2015 (Originally 02/28/2014)  . TETANUS/TDAP  03/31/2022  . COLONOSCOPY  11/06/2022   Glaucoma testing by Dr Tonny Branch 10/11/2013 - notes scanned in correspondence from 11/03/2013 with hospital encournter. Results(applanation):  OD = 66mmHg        OS = 64mmHg All answers were reviewed with the patient and necessary referrals were made:  Cherre Robins, [facility]   09/02/2014   History reviewed: allergies, current medications, past family history, past medical history, past social history, past surgical history and problem list     Objective:    Body mass index is 22.47 kg/(m^2). BP 124/80 mmHg  Pulse 76  Ht 5\' 4"  (1.626 m)  Wt 131 lb (59.421 kg)  BMI 22.47 kg/m2    DEXA Results Date of Test T-Score for AP Spine L1-L4 T-Score for Total Left Hip T-Score for Total Right Hip  09/02/2014 3.5 -0.2 0.0                   Assessment:     Initial Annual Wellness Vistit Normal BMD     Plan:     During the course of the visit the patient was educated and counseled about appropriate screening and preventive services including:    Pneumococcal vaccine - patient refused  Influenza vaccine - patient refused  Zostavax - patient refused / has had shingles in past.  Hepatitis B vaccine - UTD per patient  Td vaccine - patient refused  Screening mammography - no indicated due to mastectomy  Bone densitometry screening - done today  Colorectal cancer screening - UTD  Diabetes screening - UTD  Glaucoma screening - UTD  Nutrition counseling - patient has fairly healthy diet.  I did discuss and gave handout about calcium intake and a list of dairy and non dairy sources of calcium  Continue with weight bearing exercise  Discussed fall frequency and reviewed fall prevention techniques.    Advanced directives: has an advanced directive - a copy has been provided  Discussed risk of high dose ASA intake.  Discussed s/s of bleeding to monitor for with high ASA intake (patient takes 325mg  of enteric coated for pain related to lupus pain.   Discussed repeating thyroid test since last was over 6 weeks ago.  Patient declined to have tested today. She want to wait until she see PCP because she wants to discuss increasing levothyroxine back to 215mcg daily.    Diet review for nutrition referral? Yes ____  Not Indicated ___X_   Patient Instructions (the written plan)  was given to the patient.  Medicare Attestation I have personally reviewed: The patient's medical and social history Their use of alcohol,  tobacco or illicit drugs Their current medications and supplements The patient's functional ability including ADLs,fall risks, home safety risks, cognitive, and hearing and visual impairment Diet and physical activities Evidence for depression or mood disorders  The patient's weight, height, BMI, and visual acuity have been recorded in the chart.  I have made referrals, counseling, and provided education to the patient based on review of the above and I have provided the patient with a written personalized care plan for preventive services.     Cherre Robins, [facility]   09/02/2014

## 2014-09-07 ENCOUNTER — Telehealth: Payer: Self-pay | Admitting: *Deleted

## 2014-09-07 NOTE — Telephone Encounter (Signed)
Patient needs a letter stating that she is healthy enough to work. She is coming in tomorrow for vaccinations.

## 2014-09-08 ENCOUNTER — Ambulatory Visit (INDEPENDENT_AMBULATORY_CARE_PROVIDER_SITE_OTHER): Payer: Medicare Other | Admitting: *Deleted

## 2014-09-08 ENCOUNTER — Encounter: Payer: Self-pay | Admitting: Nurse Practitioner

## 2014-09-08 DIAGNOSIS — Z0184 Encounter for antibody response examination: Secondary | ICD-10-CM

## 2014-09-08 DIAGNOSIS — Z23 Encounter for immunization: Secondary | ICD-10-CM

## 2014-09-08 NOTE — Telephone Encounter (Signed)
Letter ready for pick up

## 2014-09-08 NOTE — Telephone Encounter (Signed)
Patient aware that letter is ready to be picked up

## 2014-09-08 NOTE — Progress Notes (Signed)
Pt given flu shot and TDAP IM today, pt tolerated injection well. 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

## 2014-09-09 LAB — MEASLES/MUMPS/RUBELLA IMMUNITY
RUBELLA: 4.76 {index} (ref >0.99)
RUBEOLA AB, IGG: 259 [AU]/ml (ref >29.9)

## 2014-09-10 ENCOUNTER — Telehealth: Payer: Self-pay | Admitting: *Deleted

## 2014-09-10 NOTE — Telephone Encounter (Signed)
-----   Message from [name], [place] sent at 09/10/2014  8:25 AM EST ----- Positive immunity

## 2014-09-14 ENCOUNTER — Other Ambulatory Visit: Payer: Self-pay | Admitting: Nurse Practitioner

## 2014-09-14 ENCOUNTER — Telehealth: Payer: Self-pay | Admitting: *Deleted

## 2014-09-14 NOTE — Telephone Encounter (Signed)
-----   Message from [name], [place] sent at 09/10/2014  8:25 AM EST ----- Positive immunity

## 2014-09-14 NOTE — Telephone Encounter (Signed)
Patient last seen in office on 05-07-14. Rx last filled on 06-30-14 #180 with 1 RF. Please advise. If approved please route to Pool B so nurse can phone in to [facility]

## 2014-09-29 DIAGNOSIS — M179 Osteoarthritis of knee, unspecified: Secondary | ICD-10-CM | POA: Diagnosis not present

## 2014-09-29 DIAGNOSIS — M25561 Pain in right knee: Secondary | ICD-10-CM | POA: Diagnosis not present

## 2014-10-05 DIAGNOSIS — S838X1A Sprain of other specified parts of right knee, initial encounter: Secondary | ICD-10-CM | POA: Diagnosis not present

## 2014-10-05 DIAGNOSIS — M179 Osteoarthritis of knee, unspecified: Secondary | ICD-10-CM | POA: Diagnosis not present

## 2014-10-05 DIAGNOSIS — S83241A Other tear of medial meniscus, current injury, right knee, initial encounter: Secondary | ICD-10-CM | POA: Diagnosis not present

## 2014-10-05 DIAGNOSIS — Z01812 Encounter for preprocedural laboratory examination: Secondary | ICD-10-CM | POA: Diagnosis not present

## 2014-10-16 DIAGNOSIS — N390 Urinary tract infection, site not specified: Secondary | ICD-10-CM | POA: Diagnosis not present

## 2014-10-16 DIAGNOSIS — R350 Frequency of micturition: Secondary | ICD-10-CM | POA: Diagnosis not present

## 2014-10-29 DIAGNOSIS — R350 Frequency of micturition: Secondary | ICD-10-CM | POA: Diagnosis not present

## 2014-10-29 DIAGNOSIS — N3090 Cystitis, unspecified without hematuria: Secondary | ICD-10-CM | POA: Diagnosis not present

## 2014-11-02 DIAGNOSIS — M25561 Pain in right knee: Secondary | ICD-10-CM | POA: Diagnosis not present

## 2014-11-02 DIAGNOSIS — M179 Osteoarthritis of knee, unspecified: Secondary | ICD-10-CM | POA: Diagnosis not present

## 2014-11-03 DIAGNOSIS — Z01818 Encounter for other preprocedural examination: Secondary | ICD-10-CM | POA: Diagnosis not present

## 2014-11-03 DIAGNOSIS — J9809 Other diseases of bronchus, not elsewhere classified: Secondary | ICD-10-CM | POA: Diagnosis not present

## 2014-11-06 ENCOUNTER — Encounter: Payer: Self-pay | Admitting: Nurse Practitioner

## 2014-11-06 ENCOUNTER — Ambulatory Visit (INDEPENDENT_AMBULATORY_CARE_PROVIDER_SITE_OTHER): Payer: Medicare Other | Admitting: Nurse Practitioner

## 2014-11-06 VITALS — BP 137/87 | HR 73 | Temp 97.8°F | Ht 64.0 in | Wt 141.0 lb

## 2014-11-06 DIAGNOSIS — E038 Other specified hypothyroidism: Secondary | ICD-10-CM | POA: Diagnosis not present

## 2014-11-06 DIAGNOSIS — M329 Systemic lupus erythematosus, unspecified: Secondary | ICD-10-CM | POA: Diagnosis not present

## 2014-11-06 DIAGNOSIS — E0789 Other specified disorders of thyroid: Secondary | ICD-10-CM | POA: Diagnosis not present

## 2014-11-06 DIAGNOSIS — Z01818 Encounter for other preprocedural examination: Secondary | ICD-10-CM | POA: Diagnosis not present

## 2014-11-06 DIAGNOSIS — E034 Atrophy of thyroid (acquired): Secondary | ICD-10-CM

## 2014-11-06 DIAGNOSIS — M1711 Unilateral primary osteoarthritis, right knee: Secondary | ICD-10-CM | POA: Diagnosis not present

## 2014-11-06 DIAGNOSIS — Z91048 Other nonmedicinal substance allergy status: Secondary | ICD-10-CM | POA: Diagnosis not present

## 2014-11-06 DIAGNOSIS — Z888 Allergy status to other drugs, medicaments and biological substances status: Secondary | ICD-10-CM | POA: Diagnosis not present

## 2014-11-06 DIAGNOSIS — Z01812 Encounter for preprocedural laboratory examination: Secondary | ICD-10-CM | POA: Diagnosis not present

## 2014-11-06 MED ORDER — TRAMADOL HCL 50 MG PO TABS: 50.0000 mg | ORAL_TABLET | Freq: Three times a day (TID) | ORAL | Status: DC | PRN

## 2014-11-06 NOTE — Progress Notes (Signed)
   Subjective:    Patient ID: Kimberly Roth, female    DOB: [birth year], 69 y.o.   MRN: [id number]  HPI Patient is here today for surgical clearance- surgery scheduled for Tuesday morning.- she had preanesthesia visit at [facility] yesterday including EKG and chest x ray along with labs. SHe has a history of hypothyroidism and medication was decreased at last visit- she says now she is gaining weight and hair is thinning. Would like levels rechecked. She has no other complaints today.  * Has systemic lupus and they have stopped all of her meds and she is in a lot of pain.  Review of Systems  Constitutional: Negative.   HENT: Negative.   Respiratory: Negative.   Cardiovascular: Negative.   Gastrointestinal: Negative.   Genitourinary: Negative.   Neurological: Negative.   Psychiatric/Behavioral: Negative.   All other systems reviewed and are negative.      Objective:   Physical Exam  Constitutional: She is oriented to person, place, and time. She appears well-developed and well-nourished.  HENT:  Nose: Nose normal.  Mouth/Throat: Oropharynx is clear and moist.  Eyes: EOM are normal.  Neck: Trachea normal, normal range of motion and full passive range of motion without pain. Neck supple. No JVD present. Carotid bruit is not present. No thyromegaly present.  Cardiovascular: Normal rate, regular rhythm, normal heart sounds and intact distal pulses.  Exam reveals no gallop and no friction rub.   No murmur heard. Pulmonary/Chest: Effort normal and breath sounds normal.  Abdominal: Soft. Bowel sounds are normal. She exhibits no distension and no mass. There is no tenderness.  Musculoskeletal: Normal range of motion.  Lymphadenopathy:    She has no cervical adenopathy.  Neurological: She is alert and oriented to person, place, and time. She has normal reflexes.  Skin: Skin is warm and dry.  Psychiatric: She has a normal mood and affect. Her behavior is normal. Judgment and thought  content normal.   BP 137/87 mmHg  Pulse 73  Temp(Src) 97.8 F (36.6 C) (Oral)  Ht 5\' 4"  (1.626 m)  Wt 141 lb (63.957 kg)  BMI 24.19 kg/m2        Assessment & Plan:  1. Preoperative clearance Reviewed all test done at [facility] except EKG because was not available- will defer that to anesthesia Medically cleared for surgery  2. Hypothyroidism due to acquired atrophy of thyroid Labs pending - Thyroid Panel With TSH  3. Systemic lupus - ultram 50 mg 1 po Bid #40 0 refills  [name] Done, FNP

## 2014-11-07 LAB — THYROID PANEL WITH TSH
FREE THYROXINE INDEX: 2.8 (ref 1.2–4.9)
T3 Uptake Ratio: 27 % (ref 24–39)
T4, Total: 10.5 ug/dL (ref 4.5–12.0)
TSH: 0.161 u[IU]/mL — AB (ref 0.450–4.500)

## 2014-11-08 ENCOUNTER — Other Ambulatory Visit: Payer: Self-pay | Admitting: Nurse Practitioner

## 2014-11-08 MED ORDER — LEVOTHYROXINE SODIUM 150 MCG PO TABS: 150.0000 ug | ORAL_TABLET | Freq: Every day | ORAL | Status: DC

## 2014-11-09 NOTE — Progress Notes (Signed)
Fast busy #.

## 2014-11-10 DIAGNOSIS — Z8541 Personal history of malignant neoplasm of cervix uteri: Secondary | ICD-10-CM | POA: Diagnosis not present

## 2014-11-10 DIAGNOSIS — Z888 Allergy status to other drugs, medicaments and biological substances status: Secondary | ICD-10-CM | POA: Diagnosis not present

## 2014-11-10 DIAGNOSIS — S8991XA Unspecified injury of right lower leg, initial encounter: Secondary | ICD-10-CM | POA: Diagnosis not present

## 2014-11-10 DIAGNOSIS — R279 Unspecified lack of coordination: Secondary | ICD-10-CM | POA: Diagnosis not present

## 2014-11-10 DIAGNOSIS — Z853 Personal history of malignant neoplasm of breast: Secondary | ICD-10-CM | POA: Diagnosis not present

## 2014-11-10 DIAGNOSIS — Z9013 Acquired absence of bilateral breasts and nipples: Secondary | ICD-10-CM | POA: Diagnosis not present

## 2014-11-10 DIAGNOSIS — Z8249 Family history of ischemic heart disease and other diseases of the circulatory system: Secondary | ICD-10-CM | POA: Diagnosis not present

## 2014-11-10 DIAGNOSIS — Z8489 Family history of other specified conditions: Secondary | ICD-10-CM | POA: Diagnosis not present

## 2014-11-10 DIAGNOSIS — F419 Anxiety disorder, unspecified: Secondary | ICD-10-CM | POA: Diagnosis not present

## 2014-11-10 DIAGNOSIS — E871 Hypo-osmolality and hyponatremia: Secondary | ICD-10-CM | POA: Diagnosis not present

## 2014-11-10 DIAGNOSIS — Z7401 Bed confinement status: Secondary | ICD-10-CM | POA: Diagnosis not present

## 2014-11-10 DIAGNOSIS — M25561 Pain in right knee: Secondary | ICD-10-CM | POA: Diagnosis not present

## 2014-11-10 DIAGNOSIS — Z886 Allergy status to analgesic agent status: Secondary | ICD-10-CM | POA: Diagnosis not present

## 2014-11-10 DIAGNOSIS — F329 Major depressive disorder, single episode, unspecified: Secondary | ICD-10-CM | POA: Diagnosis not present

## 2014-11-10 DIAGNOSIS — Z96652 Presence of left artificial knee joint: Secondary | ICD-10-CM | POA: Diagnosis not present

## 2014-11-10 DIAGNOSIS — Z79899 Other long term (current) drug therapy: Secondary | ICD-10-CM | POA: Diagnosis not present

## 2014-11-10 DIAGNOSIS — E039 Hypothyroidism, unspecified: Secondary | ICD-10-CM | POA: Diagnosis not present

## 2014-11-10 DIAGNOSIS — Z471 Aftercare following joint replacement surgery: Secondary | ICD-10-CM | POA: Diagnosis not present

## 2014-11-10 DIAGNOSIS — Z7982 Long term (current) use of aspirin: Secondary | ICD-10-CM | POA: Diagnosis not present

## 2014-11-10 DIAGNOSIS — Z90710 Acquired absence of both cervix and uterus: Secondary | ICD-10-CM | POA: Diagnosis not present

## 2014-11-10 DIAGNOSIS — R0681 Apnea, not elsewhere classified: Secondary | ICD-10-CM | POA: Diagnosis not present

## 2014-11-10 DIAGNOSIS — R0902 Hypoxemia: Secondary | ICD-10-CM | POA: Diagnosis not present

## 2014-11-10 DIAGNOSIS — M179 Osteoarthritis of knee, unspecified: Secondary | ICD-10-CM | POA: Diagnosis not present

## 2014-11-10 DIAGNOSIS — Z96651 Presence of right artificial knee joint: Secondary | ICD-10-CM | POA: Diagnosis not present

## 2014-11-10 DIAGNOSIS — Z809 Family history of malignant neoplasm, unspecified: Secondary | ICD-10-CM | POA: Diagnosis not present

## 2014-11-10 DIAGNOSIS — Z885 Allergy status to narcotic agent status: Secondary | ICD-10-CM | POA: Diagnosis not present

## 2014-11-10 DIAGNOSIS — M1711 Unilateral primary osteoarthritis, right knee: Secondary | ICD-10-CM | POA: Diagnosis not present

## 2014-11-14 DIAGNOSIS — Z96651 Presence of right artificial knee joint: Secondary | ICD-10-CM | POA: Diagnosis not present

## 2014-11-14 DIAGNOSIS — M329 Systemic lupus erythematosus, unspecified: Secondary | ICD-10-CM | POA: Diagnosis not present

## 2014-11-14 DIAGNOSIS — Z96652 Presence of left artificial knee joint: Secondary | ICD-10-CM | POA: Diagnosis not present

## 2014-11-14 DIAGNOSIS — Z471 Aftercare following joint replacement surgery: Secondary | ICD-10-CM | POA: Diagnosis not present

## 2014-11-14 DIAGNOSIS — Z853 Personal history of malignant neoplasm of breast: Secondary | ICD-10-CM | POA: Diagnosis not present

## 2014-11-17 DIAGNOSIS — Z96651 Presence of right artificial knee joint: Secondary | ICD-10-CM | POA: Diagnosis not present

## 2014-11-17 DIAGNOSIS — Z96652 Presence of left artificial knee joint: Secondary | ICD-10-CM | POA: Diagnosis not present

## 2014-11-17 DIAGNOSIS — M329 Systemic lupus erythematosus, unspecified: Secondary | ICD-10-CM | POA: Diagnosis not present

## 2014-11-17 DIAGNOSIS — Z471 Aftercare following joint replacement surgery: Secondary | ICD-10-CM | POA: Diagnosis not present

## 2014-11-17 DIAGNOSIS — Z853 Personal history of malignant neoplasm of breast: Secondary | ICD-10-CM | POA: Diagnosis not present

## 2014-11-18 DIAGNOSIS — Z471 Aftercare following joint replacement surgery: Secondary | ICD-10-CM | POA: Diagnosis not present

## 2014-11-18 DIAGNOSIS — Z96651 Presence of right artificial knee joint: Secondary | ICD-10-CM | POA: Diagnosis not present

## 2014-11-18 DIAGNOSIS — M329 Systemic lupus erythematosus, unspecified: Secondary | ICD-10-CM | POA: Diagnosis not present

## 2014-11-18 DIAGNOSIS — Z96652 Presence of left artificial knee joint: Secondary | ICD-10-CM | POA: Diagnosis not present

## 2014-11-18 DIAGNOSIS — Z853 Personal history of malignant neoplasm of breast: Secondary | ICD-10-CM | POA: Diagnosis not present

## 2014-11-19 DIAGNOSIS — M329 Systemic lupus erythematosus, unspecified: Secondary | ICD-10-CM | POA: Diagnosis not present

## 2014-11-19 DIAGNOSIS — Z853 Personal history of malignant neoplasm of breast: Secondary | ICD-10-CM | POA: Diagnosis not present

## 2014-11-19 DIAGNOSIS — Z96651 Presence of right artificial knee joint: Secondary | ICD-10-CM | POA: Diagnosis not present

## 2014-11-19 DIAGNOSIS — Z471 Aftercare following joint replacement surgery: Secondary | ICD-10-CM | POA: Diagnosis not present

## 2014-11-19 DIAGNOSIS — Z96652 Presence of left artificial knee joint: Secondary | ICD-10-CM | POA: Diagnosis not present

## 2014-11-20 DIAGNOSIS — Z853 Personal history of malignant neoplasm of breast: Secondary | ICD-10-CM | POA: Diagnosis not present

## 2014-11-20 DIAGNOSIS — Z471 Aftercare following joint replacement surgery: Secondary | ICD-10-CM | POA: Diagnosis not present

## 2014-11-20 DIAGNOSIS — M329 Systemic lupus erythematosus, unspecified: Secondary | ICD-10-CM | POA: Diagnosis not present

## 2014-11-20 DIAGNOSIS — Z96652 Presence of left artificial knee joint: Secondary | ICD-10-CM | POA: Diagnosis not present

## 2014-11-20 DIAGNOSIS — Z96651 Presence of right artificial knee joint: Secondary | ICD-10-CM | POA: Diagnosis not present

## 2014-11-23 DIAGNOSIS — Z96651 Presence of right artificial knee joint: Secondary | ICD-10-CM | POA: Diagnosis not present

## 2014-11-23 DIAGNOSIS — M329 Systemic lupus erythematosus, unspecified: Secondary | ICD-10-CM | POA: Diagnosis not present

## 2014-11-23 DIAGNOSIS — Z96652 Presence of left artificial knee joint: Secondary | ICD-10-CM | POA: Diagnosis not present

## 2014-11-23 DIAGNOSIS — Z853 Personal history of malignant neoplasm of breast: Secondary | ICD-10-CM | POA: Diagnosis not present

## 2014-11-23 DIAGNOSIS — Z471 Aftercare following joint replacement surgery: Secondary | ICD-10-CM | POA: Diagnosis not present

## 2014-11-24 DIAGNOSIS — Z96659 Presence of unspecified artificial knee joint: Secondary | ICD-10-CM | POA: Diagnosis not present

## 2014-11-24 DIAGNOSIS — M179 Osteoarthritis of knee, unspecified: Secondary | ICD-10-CM | POA: Diagnosis not present

## 2014-11-25 DIAGNOSIS — Z471 Aftercare following joint replacement surgery: Secondary | ICD-10-CM | POA: Diagnosis not present

## 2014-11-25 DIAGNOSIS — Z853 Personal history of malignant neoplasm of breast: Secondary | ICD-10-CM | POA: Diagnosis not present

## 2014-11-25 DIAGNOSIS — M329 Systemic lupus erythematosus, unspecified: Secondary | ICD-10-CM | POA: Diagnosis not present

## 2014-11-25 DIAGNOSIS — Z96652 Presence of left artificial knee joint: Secondary | ICD-10-CM | POA: Diagnosis not present

## 2014-11-25 DIAGNOSIS — Z96651 Presence of right artificial knee joint: Secondary | ICD-10-CM | POA: Diagnosis not present

## 2014-11-27 DIAGNOSIS — Z96652 Presence of left artificial knee joint: Secondary | ICD-10-CM | POA: Diagnosis not present

## 2014-11-27 DIAGNOSIS — Z96651 Presence of right artificial knee joint: Secondary | ICD-10-CM | POA: Diagnosis not present

## 2014-11-27 DIAGNOSIS — Z853 Personal history of malignant neoplasm of breast: Secondary | ICD-10-CM | POA: Diagnosis not present

## 2014-11-27 DIAGNOSIS — M329 Systemic lupus erythematosus, unspecified: Secondary | ICD-10-CM | POA: Diagnosis not present

## 2014-11-27 DIAGNOSIS — Z471 Aftercare following joint replacement surgery: Secondary | ICD-10-CM | POA: Diagnosis not present

## 2014-11-29 DIAGNOSIS — Z471 Aftercare following joint replacement surgery: Secondary | ICD-10-CM | POA: Diagnosis not present

## 2014-11-29 DIAGNOSIS — Z96651 Presence of right artificial knee joint: Secondary | ICD-10-CM | POA: Diagnosis not present

## 2014-11-29 DIAGNOSIS — Z96652 Presence of left artificial knee joint: Secondary | ICD-10-CM | POA: Diagnosis not present

## 2014-11-29 DIAGNOSIS — M329 Systemic lupus erythematosus, unspecified: Secondary | ICD-10-CM | POA: Diagnosis not present

## 2014-11-29 DIAGNOSIS — Z853 Personal history of malignant neoplasm of breast: Secondary | ICD-10-CM | POA: Diagnosis not present

## 2014-12-03 DIAGNOSIS — Z96652 Presence of left artificial knee joint: Secondary | ICD-10-CM | POA: Diagnosis not present

## 2014-12-03 DIAGNOSIS — Z471 Aftercare following joint replacement surgery: Secondary | ICD-10-CM | POA: Diagnosis not present

## 2014-12-03 DIAGNOSIS — M329 Systemic lupus erythematosus, unspecified: Secondary | ICD-10-CM | POA: Diagnosis not present

## 2014-12-03 DIAGNOSIS — Z853 Personal history of malignant neoplasm of breast: Secondary | ICD-10-CM | POA: Diagnosis not present

## 2014-12-03 DIAGNOSIS — Z96651 Presence of right artificial knee joint: Secondary | ICD-10-CM | POA: Diagnosis not present

## 2014-12-04 DIAGNOSIS — M329 Systemic lupus erythematosus, unspecified: Secondary | ICD-10-CM | POA: Diagnosis not present

## 2014-12-04 DIAGNOSIS — Z853 Personal history of malignant neoplasm of breast: Secondary | ICD-10-CM | POA: Diagnosis not present

## 2014-12-04 DIAGNOSIS — Z96651 Presence of right artificial knee joint: Secondary | ICD-10-CM | POA: Diagnosis not present

## 2014-12-04 DIAGNOSIS — Z96652 Presence of left artificial knee joint: Secondary | ICD-10-CM | POA: Diagnosis not present

## 2014-12-04 DIAGNOSIS — Z471 Aftercare following joint replacement surgery: Secondary | ICD-10-CM | POA: Diagnosis not present

## 2014-12-09 DIAGNOSIS — Z471 Aftercare following joint replacement surgery: Secondary | ICD-10-CM | POA: Diagnosis not present

## 2014-12-09 DIAGNOSIS — Z96652 Presence of left artificial knee joint: Secondary | ICD-10-CM | POA: Diagnosis not present

## 2014-12-09 DIAGNOSIS — M329 Systemic lupus erythematosus, unspecified: Secondary | ICD-10-CM | POA: Diagnosis not present

## 2014-12-09 DIAGNOSIS — Z853 Personal history of malignant neoplasm of breast: Secondary | ICD-10-CM | POA: Diagnosis not present

## 2014-12-09 DIAGNOSIS — Z96651 Presence of right artificial knee joint: Secondary | ICD-10-CM | POA: Diagnosis not present

## 2014-12-11 DIAGNOSIS — M329 Systemic lupus erythematosus, unspecified: Secondary | ICD-10-CM | POA: Diagnosis not present

## 2014-12-11 DIAGNOSIS — Z96651 Presence of right artificial knee joint: Secondary | ICD-10-CM | POA: Diagnosis not present

## 2014-12-11 DIAGNOSIS — Z471 Aftercare following joint replacement surgery: Secondary | ICD-10-CM | POA: Diagnosis not present

## 2014-12-11 DIAGNOSIS — Z853 Personal history of malignant neoplasm of breast: Secondary | ICD-10-CM | POA: Diagnosis not present

## 2014-12-11 DIAGNOSIS — Z96652 Presence of left artificial knee joint: Secondary | ICD-10-CM | POA: Diagnosis not present

## 2014-12-15 DIAGNOSIS — Z96652 Presence of left artificial knee joint: Secondary | ICD-10-CM | POA: Diagnosis not present

## 2014-12-15 DIAGNOSIS — Z471 Aftercare following joint replacement surgery: Secondary | ICD-10-CM | POA: Diagnosis not present

## 2014-12-15 DIAGNOSIS — Z96651 Presence of right artificial knee joint: Secondary | ICD-10-CM | POA: Diagnosis not present

## 2014-12-15 DIAGNOSIS — M329 Systemic lupus erythematosus, unspecified: Secondary | ICD-10-CM | POA: Diagnosis not present

## 2014-12-15 DIAGNOSIS — Z853 Personal history of malignant neoplasm of breast: Secondary | ICD-10-CM | POA: Diagnosis not present

## 2014-12-16 ENCOUNTER — Telehealth: Payer: Self-pay | Admitting: Nurse Practitioner

## 2014-12-16 MED ORDER — CHLORDIAZEPOXIDE HCL 25 MG PO CAPS: 25.0000 mg | ORAL_CAPSULE | Freq: Two times a day (BID) | ORAL | Status: DC

## 2014-12-16 NOTE — Telephone Encounter (Signed)
I sent the Rx to the pharmacy.

## 2014-12-17 DIAGNOSIS — Z96651 Presence of right artificial knee joint: Secondary | ICD-10-CM | POA: Diagnosis not present

## 2014-12-17 DIAGNOSIS — Z471 Aftercare following joint replacement surgery: Secondary | ICD-10-CM | POA: Diagnosis not present

## 2014-12-17 DIAGNOSIS — Z96652 Presence of left artificial knee joint: Secondary | ICD-10-CM | POA: Diagnosis not present

## 2014-12-17 DIAGNOSIS — Z853 Personal history of malignant neoplasm of breast: Secondary | ICD-10-CM | POA: Diagnosis not present

## 2014-12-17 DIAGNOSIS — M329 Systemic lupus erythematosus, unspecified: Secondary | ICD-10-CM | POA: Diagnosis not present

## 2014-12-18 ENCOUNTER — Other Ambulatory Visit: Payer: Self-pay | Admitting: Family Medicine

## 2014-12-18 NOTE — Telephone Encounter (Signed)
[facility]

## 2014-12-18 NOTE — Telephone Encounter (Signed)
She said it was done 2 days ago, but pharmacy doesn't have

## 2014-12-18 NOTE — Telephone Encounter (Signed)
Refill called to pharmacy.

## 2014-12-20 DIAGNOSIS — R300 Dysuria: Secondary | ICD-10-CM | POA: Diagnosis not present

## 2014-12-22 DIAGNOSIS — M329 Systemic lupus erythematosus, unspecified: Secondary | ICD-10-CM | POA: Diagnosis not present

## 2014-12-22 DIAGNOSIS — Z471 Aftercare following joint replacement surgery: Secondary | ICD-10-CM | POA: Diagnosis not present

## 2014-12-22 DIAGNOSIS — Z96651 Presence of right artificial knee joint: Secondary | ICD-10-CM | POA: Diagnosis not present

## 2014-12-22 DIAGNOSIS — Z853 Personal history of malignant neoplasm of breast: Secondary | ICD-10-CM | POA: Diagnosis not present

## 2014-12-22 DIAGNOSIS — Z96652 Presence of left artificial knee joint: Secondary | ICD-10-CM | POA: Diagnosis not present

## 2014-12-24 DIAGNOSIS — M179 Osteoarthritis of knee, unspecified: Secondary | ICD-10-CM | POA: Diagnosis not present

## 2014-12-24 DIAGNOSIS — Z96659 Presence of unspecified artificial knee joint: Secondary | ICD-10-CM | POA: Diagnosis not present

## 2014-12-25 ENCOUNTER — Telehealth: Payer: Self-pay

## 2014-12-25 DIAGNOSIS — Z471 Aftercare following joint replacement surgery: Secondary | ICD-10-CM | POA: Diagnosis not present

## 2014-12-25 DIAGNOSIS — Z96651 Presence of right artificial knee joint: Secondary | ICD-10-CM | POA: Diagnosis not present

## 2014-12-25 DIAGNOSIS — Z96652 Presence of left artificial knee joint: Secondary | ICD-10-CM | POA: Diagnosis not present

## 2014-12-25 DIAGNOSIS — M329 Systemic lupus erythematosus, unspecified: Secondary | ICD-10-CM | POA: Diagnosis not present

## 2014-12-25 DIAGNOSIS — Z853 Personal history of malignant neoplasm of breast: Secondary | ICD-10-CM | POA: Diagnosis not present

## 2014-12-25 NOTE — Telephone Encounter (Signed)
BP 160/100  Patient in a lot of pain and she said that is not unusual for her

## 2014-12-25 NOTE — Telephone Encounter (Signed)
Pain from what? Is she  running a fever, has she had surgery recently? She may need to have an appointment to be checked by someone today if triage thinks that is appropriate

## 2014-12-25 NOTE — Telephone Encounter (Signed)
Had recent surg. - knee Monitor BP and let us know if this continues.   Has systemic lupus. In a lot pain- with vomiting.-- causing bp to go up.  Seen Dr Case yesterday - ortho

## 2014-12-30 DIAGNOSIS — Z96651 Presence of right artificial knee joint: Secondary | ICD-10-CM | POA: Diagnosis not present

## 2014-12-30 DIAGNOSIS — Z853 Personal history of malignant neoplasm of breast: Secondary | ICD-10-CM | POA: Diagnosis not present

## 2014-12-30 DIAGNOSIS — Z96652 Presence of left artificial knee joint: Secondary | ICD-10-CM | POA: Diagnosis not present

## 2014-12-30 DIAGNOSIS — M329 Systemic lupus erythematosus, unspecified: Secondary | ICD-10-CM | POA: Diagnosis not present

## 2014-12-30 DIAGNOSIS — Z471 Aftercare following joint replacement surgery: Secondary | ICD-10-CM | POA: Diagnosis not present

## 2014-12-31 DIAGNOSIS — Z853 Personal history of malignant neoplasm of breast: Secondary | ICD-10-CM | POA: Diagnosis not present

## 2014-12-31 DIAGNOSIS — Z471 Aftercare following joint replacement surgery: Secondary | ICD-10-CM | POA: Diagnosis not present

## 2014-12-31 DIAGNOSIS — Z96651 Presence of right artificial knee joint: Secondary | ICD-10-CM | POA: Diagnosis not present

## 2014-12-31 DIAGNOSIS — Z96652 Presence of left artificial knee joint: Secondary | ICD-10-CM | POA: Diagnosis not present

## 2014-12-31 DIAGNOSIS — M329 Systemic lupus erythematosus, unspecified: Secondary | ICD-10-CM | POA: Diagnosis not present

## 2015-01-05 DIAGNOSIS — Z96652 Presence of left artificial knee joint: Secondary | ICD-10-CM | POA: Diagnosis not present

## 2015-01-05 DIAGNOSIS — Z471 Aftercare following joint replacement surgery: Secondary | ICD-10-CM | POA: Diagnosis not present

## 2015-01-05 DIAGNOSIS — Z853 Personal history of malignant neoplasm of breast: Secondary | ICD-10-CM | POA: Diagnosis not present

## 2015-01-05 DIAGNOSIS — M329 Systemic lupus erythematosus, unspecified: Secondary | ICD-10-CM | POA: Diagnosis not present

## 2015-01-05 DIAGNOSIS — Z96651 Presence of right artificial knee joint: Secondary | ICD-10-CM | POA: Diagnosis not present

## 2015-01-07 DIAGNOSIS — Z853 Personal history of malignant neoplasm of breast: Secondary | ICD-10-CM | POA: Diagnosis not present

## 2015-01-07 DIAGNOSIS — M329 Systemic lupus erythematosus, unspecified: Secondary | ICD-10-CM | POA: Diagnosis not present

## 2015-01-07 DIAGNOSIS — Z96651 Presence of right artificial knee joint: Secondary | ICD-10-CM | POA: Diagnosis not present

## 2015-01-07 DIAGNOSIS — Z471 Aftercare following joint replacement surgery: Secondary | ICD-10-CM | POA: Diagnosis not present

## 2015-01-07 DIAGNOSIS — Z96652 Presence of left artificial knee joint: Secondary | ICD-10-CM | POA: Diagnosis not present

## 2015-01-12 DIAGNOSIS — Z471 Aftercare following joint replacement surgery: Secondary | ICD-10-CM | POA: Diagnosis not present

## 2015-01-12 DIAGNOSIS — Z96651 Presence of right artificial knee joint: Secondary | ICD-10-CM | POA: Diagnosis not present

## 2015-01-12 DIAGNOSIS — Z853 Personal history of malignant neoplasm of breast: Secondary | ICD-10-CM | POA: Diagnosis not present

## 2015-01-12 DIAGNOSIS — M329 Systemic lupus erythematosus, unspecified: Secondary | ICD-10-CM | POA: Diagnosis not present

## 2015-01-12 DIAGNOSIS — Z96652 Presence of left artificial knee joint: Secondary | ICD-10-CM | POA: Diagnosis not present

## 2015-01-20 ENCOUNTER — Encounter: Payer: Self-pay | Admitting: *Deleted

## 2015-02-03 DIAGNOSIS — M179 Osteoarthritis of knee, unspecified: Secondary | ICD-10-CM | POA: Diagnosis not present

## 2015-02-03 DIAGNOSIS — Z96659 Presence of unspecified artificial knee joint: Secondary | ICD-10-CM | POA: Diagnosis not present

## 2015-03-15 ENCOUNTER — Other Ambulatory Visit: Payer: Self-pay | Admitting: Nurse Practitioner

## 2015-03-15 NOTE — Telephone Encounter (Signed)
Last seen 11/16/14  MMM

## 2015-03-25 DIAGNOSIS — H40033 Anatomical narrow angle, bilateral: Secondary | ICD-10-CM | POA: Diagnosis not present

## 2015-03-25 DIAGNOSIS — H2513 Age-related nuclear cataract, bilateral: Secondary | ICD-10-CM | POA: Diagnosis not present

## 2015-04-03 DIAGNOSIS — H04123 Dry eye syndrome of bilateral lacrimal glands: Secondary | ICD-10-CM | POA: Diagnosis not present

## 2015-05-30 ENCOUNTER — Other Ambulatory Visit: Payer: Self-pay | Admitting: Nurse Practitioner

## 2015-05-31 NOTE — Telephone Encounter (Signed)
Last filled

## 2015-05-31 NOTE — Telephone Encounter (Signed)
Last seen 11/06/14  MMM  If approved route to nurse to call into [facility]

## 2015-06-01 ENCOUNTER — Other Ambulatory Visit: Payer: Self-pay | Admitting: Nurse Practitioner

## 2015-06-02 ENCOUNTER — Telehealth: Payer: Self-pay | Admitting: Nurse Practitioner

## 2015-06-02 NOTE — Telephone Encounter (Signed)
callled in yesterday, pt aware

## 2015-07-01 DIAGNOSIS — R091 Pleurisy: Secondary | ICD-10-CM | POA: Diagnosis not present

## 2015-07-01 DIAGNOSIS — R509 Fever, unspecified: Secondary | ICD-10-CM | POA: Diagnosis not present

## 2015-07-14 ENCOUNTER — Other Ambulatory Visit: Payer: Self-pay | Admitting: Nurse Practitioner

## 2015-07-19 DIAGNOSIS — H26491 Other secondary cataract, right eye: Secondary | ICD-10-CM | POA: Diagnosis not present

## 2015-07-19 DIAGNOSIS — H26492 Other secondary cataract, left eye: Secondary | ICD-10-CM | POA: Diagnosis not present

## 2015-07-19 DIAGNOSIS — H26493 Other secondary cataract, bilateral: Secondary | ICD-10-CM | POA: Diagnosis not present

## 2015-08-11 ENCOUNTER — Other Ambulatory Visit: Payer: Self-pay | Admitting: Nurse Practitioner

## 2015-08-12 NOTE — Telephone Encounter (Signed)
Patient NTBS for follow up and lab work  

## 2015-08-12 NOTE — Telephone Encounter (Signed)
See last labs 

## 2015-09-06 ENCOUNTER — Other Ambulatory Visit: Payer: Self-pay | Admitting: Nurse Practitioner

## 2015-09-07 NOTE — Telephone Encounter (Signed)
rx ready for pickup 

## 2015-09-07 NOTE — Telephone Encounter (Signed)
Last seen 11/06/14  MMM

## 2015-09-07 NOTE — Telephone Encounter (Signed)
Patient picked up rx.

## 2015-10-06 ENCOUNTER — Other Ambulatory Visit: Payer: Self-pay | Admitting: Orthopaedic Surgery

## 2015-10-06 DIAGNOSIS — M25511 Pain in right shoulder: Secondary | ICD-10-CM

## 2015-11-20 ENCOUNTER — Other Ambulatory Visit: Payer: Self-pay | Admitting: Nurse Practitioner

## 2015-12-01 ENCOUNTER — Other Ambulatory Visit: Payer: Self-pay | Admitting: Nurse Practitioner

## 2015-12-02 ENCOUNTER — Other Ambulatory Visit: Payer: Self-pay | Admitting: Nurse Practitioner

## 2015-12-02 MED ORDER — CHLORDIAZEPOXIDE HCL 25 MG PO CAPS: 25.0000 mg | ORAL_CAPSULE | Freq: Two times a day (BID) | ORAL | Status: DC

## 2015-12-02 NOTE — Telephone Encounter (Signed)
Librium rx ready for pick up- Last refill without being seen

## 2015-12-03 NOTE — Telephone Encounter (Signed)
Patient aware that Rx is ready for pick up.  Appointment made for 01/31/16

## 2015-12-27 ENCOUNTER — Other Ambulatory Visit: Payer: Self-pay | Admitting: Nurse Practitioner

## 2015-12-28 NOTE — Telephone Encounter (Signed)
Last labs and seen 11/06/2014. Appointment scheduled for 7/3

## 2016-01-20 ENCOUNTER — Other Ambulatory Visit: Payer: Self-pay | Admitting: Nurse Practitioner

## 2016-01-27 ENCOUNTER — Encounter: Payer: Self-pay | Admitting: Pharmacist

## 2016-01-27 ENCOUNTER — Ambulatory Visit (INDEPENDENT_AMBULATORY_CARE_PROVIDER_SITE_OTHER): Payer: Medicare Other | Admitting: Pharmacist

## 2016-01-27 VITALS — BP 138/86 | HR 76 | Ht 62.5 in | Wt 139.0 lb

## 2016-01-27 DIAGNOSIS — Z0000 Encounter for general adult medical examination without abnormal findings: Secondary | ICD-10-CM | POA: Diagnosis not present

## 2016-01-27 MED ORDER — IBUPROFEN 200 MG PO TABS
200.0000 mg | ORAL_TABLET | Freq: Four times a day (QID) | ORAL | Status: AC | PRN
Start: 2016-01-27 — End: 2016-02-03

## 2016-01-27 NOTE — Progress Notes (Signed)
Subjective:   Kimberly Roth is a 70 y.o. female who presents for a subsequent Medicare Annual Wellness Visit.  Kimberly Roth lives in [place] with her husband.  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  She is seeing ortho for treatment but continues to have pain and is frustrated with the long process of getting treatment approved due to [employer] and regs.   Review of Systems  Review of Systems  Constitutional: Negative.   HENT: Negative.   Eyes: Negative.   Respiratory: Negative.   Cardiovascular: Negative.   Gastrointestinal: Negative.   Genitourinary: Negative.   Musculoskeletal: Positive for falls (1 fall in November 2016).       Reports pain in right shoulder - taking APAP prn but is not helping   Skin: Negative.   Neurological: Negative.   Endo/Heme/Allergies: Negative.   Psychiatric/Behavioral: Positive for depression. The patient is nervous/anxious.      Current Medications (verified) Outpatient Encounter Prescriptions as of 01/27/2016  Medication Sig  . acetaminophen (TYLENOL) 325 MG tablet Take 650 mg by mouth every 4 (four) hours as needed.  . Ascorbic Acid (VITAMIN C) 1000 MG tablet Take 1,000 mg by mouth 2 (two) times daily.  Marland Kitchen aspirin 325 MG EC tablet Take 325 mg by mouth 2 (two) times daily.  Marland Kitchen BIOTIN PO Take 1 tablet by mouth 2 (two) times daily.  Marland Kitchen buPROPion (WELLBUTRIN) 100 MG tablet TAKE 2 TABLETS EACH MORNING AND 1 TABLETEACH EVENING  . calcium-vitamin D (OSCAL WITH D) 250-125 MG-UNIT per tablet Take 1 tablet by mouth 2 (two) times daily.  . chlordiazePOXIDE (LIBRIUM) 25 MG capsule Take 1 capsule (25 mg total) by mouth 2 (two) times daily.  . cholecalciferol (VITAMIN D) 1000 UNITS tablet Take 1,000 Units by mouth 2 (two) times daily.  Marland Kitchen CRANBERRY EXTRACT PO Take 1 tablet by mouth 2 (two) times  daily.  . diphenhydrAMINE (BENADRYL) 25 MG tablet Take 50 mg by mouth at bedtime as needed for sleep.  Marland Kitchen ivermectin (STROMECTOL) 3 MG TABS tablet 1 po by mouth monthly  . levothyroxine (SYNTHROID, LEVOTHROID) 175 MCG tablet TAKE ONE TABLET EVERY MORNING  . Multiple Vitamins-Minerals (MULTIVITAMINS THER. W/MINERALS) TABS tablet Take 1 tablet by mouth 2 (two) times daily.  . multivitamin-lutein (OCUVITE-LUTEIN) CAPS capsule Take 1 capsule by mouth 2 (two) times daily.  . Omega-3 Fatty Acids (OMEGA 3 PO) Take 1 capsule by mouth 2 (two) times daily.  . Potassium Gluconate 595 MG CAPS Take 1 capsule by mouth daily.  . Thiamine HCl (VITAMIN B-1) 250 MG tablet Take 250 mg by mouth 2 (two) times daily.  Marland Kitchen ibuprofen (ADVIL) 200 MG tablet Take 1-2 tablets (200-400 mg total) by mouth every 6 (six) hours as needed.  . [DISCONTINUED] levothyroxine (SYNTHROID, LEVOTHROID) 150 MCG tablet Take 1 tablet (150 mcg total) by mouth daily. (Patient not taking: Reported on 01/27/2016)  . [DISCONTINUED] traMADol (ULTRAM) 50 MG tablet Take 1 tablet (50 mg total) by mouth every 8 (eight) hours as needed. (Patient not taking: Reported on 01/27/2016)   No facility-administered encounter medications on file as of 01/27/2016.    Allergies (verified) Codeine; Contrast media; Plaquenil; Tape; and Morphine and related   History: Past Medical History  Diagnosis Date  . Anxiety   . Breast cancer ([place])  breast bilateral  . Uterine cancer ([place])   . Systemic lupus ([place])   . Cataract   . Hyperlipidemia   . History of shingles   . PTSD (post-traumatic stress disorder)     related to childhood trauma   Past Surgical History  Procedure Laterality Date  . Back surgery    . Tonsillectomy    . Breast surgery Bilateral 1991    Mastectomy  . Appendectomy    . Dilation and curettage of uterus    . Wisdom tooth extraction Bilateral     Extraction x 4  . Placement of breast implants Bilateral     Secondary to cancer  .  Breast implant removal Bilateral     Silicone allergy  . Reconstruction / correction of nipple / aerola Bilateral   . Mastectomy Bilateral   . Cataract extraction w/phaco Right 10/30/2013    Procedure: CATARACT EXTRACTION PHACO AND INTRAOCULAR LENS PLACEMENT (IOC);  Surgeon: Tonny Branch, MD;  Location: [facility];  Service: Ophthalmology;  Laterality: Right;  CDE:10.13  . Cataract extraction w/phaco Left 11/10/2013    Procedure: CATARACT EXTRACTION PHACO AND INTRAOCULAR LENS PLACEMENT (IOC);  Surgeon: Tonny Branch, MD;  Location: [facility];  Service: Ophthalmology;  Laterality: Left;  CDE 9.71  . Eye surgery Bilateral 2015    Cataract - Dr Geoffry Paradise in [place]  . Wrist surgery Right 20's    patient not exactly sure of reason  . Joint replacement Left 2005    Knee x 2  . Abdominal hysterectomy  1976    no oopherectomy   Family History  Problem Relation Age of Onset  . Heart failure Father   . Heart disease Father   . Heart attack Father 66  . Mental illness Mother   . Mental illness Sister     PTSD  . Osteoporosis Paternal Grandmother    Social History   Occupational History  . Not on file.   Social History Main Topics  . Smoking status: Never Smoker   . Smokeless tobacco: Never Used  . Alcohol Use: 0.6 oz/week    1 Glasses of wine per week     Comment: 1 or 2 times per year  . Drug Use: No  . Sexual Activity: Yes    Do you feel safe at home?  Yes Are there smokers in your home (other than you)? No  Dietary issues and exercise activities: Current Exercise Habits: Home exercise routine, Type of exercise: walking, Time (Minutes): 30, Frequency (Times/Week): 7, Weekly Exercise (Minutes/Week): 210, Intensity: Moderate  Current Dietary habits:  Patient is pesco - ovo vegetarian.   Objective:    Today's Vitals   01/27/16 1605  BP: 138/86  Pulse: 76  Height: 5' 2.5" (1.588 m)  Weight: 139 lb (63.05 kg)  PainSc: 4   PainLoc: Shoulder   Body mass index is 25  kg/(m^2).  Activities of Daily Living In your present state of health, do you have any difficulty performing the following activities: 01/27/2016  Hearing? N  Vision? N  Difficulty concentrating or making decisions? N  Walking or climbing stairs? N  Dressing or bathing? Y  Doing errands, shopping? N  Preparing Food and eating ? N  Using the Toilet? N  In the past six months, have you accidently leaked urine? N  Do you have problems with loss of bowel control? N  Managing your Medications? N  Managing your Finances? N  Housekeeping or managing your Housekeeping? N     Cardiac Risk  Factors include: advanced age (>46men, >34 women)  Depression Screen PHQ 2/9 Scores 01/27/2016 09/02/2014 05/07/2014 04/09/2013  PHQ - 2 Score 2 0 0 0  PHQ- 9 Score 3 - - -     Fall Risk Fall Risk  01/27/2016 09/02/2014 05/07/2014 04/09/2013  Falls in the past year? Yes Yes No No  Number falls in past yr: 1 2 or more - -  Injury with Fall? Yes - - -  Risk for fall due to : - History of fall(s) - History of fall(s)  Follow up Falls evaluation completed - - -    Cognitive Function: MMSE - Mini Mental State Exam 01/27/2016  Orientation to time 4  Orientation to Place 5  Registration 3  Attention/ Calculation 5  Recall 2  Language- name 2 objects 2  Language- repeat 1  Language- follow 3 step command 3  Language- read & follow direction 1  Write a sentence 1  Copy design 1  Total score 28    Immunizations and Health Maintenance Immunization History  Administered Date(s) Administered  . Influenza,inj,Quad PF,36+ Mos 09/08/2014  . PPD Test 08/14/2014  . Tdap 09/08/2014   There are no preventive care reminders to display for this patient.  Patient Care Team: [name], FNP as PCP - General (Nurse Practitioner) Tonny Branch, MD as Consulting Physician (Ophthalmology)  Indicate any recent Medical Services you may have received from other than [facility] providers in the past year (date may be  approximate).    Assessment:    Annual Wellness Visit    Screening Tests Health Maintenance  Topic Date Due  . Hepatitis C Screening  02/02/2016 (Originally 19-Oct-1945)  . ZOSTAVAX  08/03/2016 (Originally 03/20/2007)  . PNA vac Low Risk Adult (1 of 2 - PCV13) 08/03/2016 (Originally 03/19/2012)  . INFLUENZA VACCINE  02/29/2016  . DEXA SCAN  09/03/2019  . COLONOSCOPY  11/06/2022  . TETANUS/TDAP  09/08/2024        Plan:   During the course of the visit Grindl was educated and counseled about the following appropriate screening and preventive services:   Pneumococcal vaccine - patient refused  Influenza vaccine - UTD  Zostavax - patient refused / has had shingles in past.  Hepatitis B vaccine - UTD per patient  Td vaccine - UTD  Screening mammography - no indicated due to mastectomy  Bone densitometry screening - UTD - was normal 2016  Colorectal cancer screening - UTD  Diabetes screening - UTD  Glaucoma screening - UTD  Nutrition counseling - no changed in diet recommended.   Continue with weight bearing exercise / walking daily  Discussed fall frequency and reviewed fall prevention techniques.   Advanced directives: has an advanced directive   Discussed repeat thyroid panel planned for next week when she sees PCP  HEP C - screening - get with labs next week  PAP - has had hysterectomy  Since shoulder injury she had experienced more depression - has appt with PCP in 1 week to discuss.   IBU 200mg  1-2 tablets q6h as needed for shoulder pain. (take with food)   Patient Instructions (the written plan) were given to the patient.   Cherre Robins, [facility]   01/27/2016

## 2016-01-27 NOTE — Patient Instructions (Addendum)
Kimberly Roth , Thank you for taking time to come for your Medicare Wellness Visit. I appreciate your ongoing commitment to your health goals. Please review the following plan we discussed and let me know if I can assist you in the future.   These are the goals we discussed: Continue to stay active - recommend at least 150 minutes of physical activity Continue to eat lots of vegetables and fruits, lean proteins (beans, fish) and whole grains.    This is a list of the screening recommended for you and due dates:  Health Maintenance  Topic Date Due  .  Hepatitis C: One time screening is recommended by Center for Disease Control  (CDC) for  adults born from [birth year] through 1964.   02/02/2016*  . Shingles Vaccine  08/03/2016*  . Pneumonia vaccines (1 of 2 - PCV13) 08/03/2016*  . Flu Shot  02/29/2016  . DEXA scan (bone density measurement)  09/03/2019  . Colon Cancer Screening  11/06/2022  . Tetanus Vaccine  09/08/2024  *Topic was postponed. The date shown is not the original due date.   Health Maintenance, Female Adopting a healthy lifestyle and getting preventive care can go a long way to promote health and wellness. Talk with your health care provider about what schedule of regular examinations is right for you. This is a good chance for you to check in with your provider about disease prevention and staying healthy. In between checkups, there are plenty of things you can do on your own. Experts have done a lot of research about which lifestyle changes and preventive measures are most likely to keep you healthy. Ask your health care provider for more information. WEIGHT AND DIET  Eat a healthy diet  Be sure to include plenty of vegetables, fruits, low-fat dairy products, and lean protein.  Do not eat a lot of foods high in solid fats, added sugars, or salt.  Get regular exercise. This is one of the most important things you can do for your health.  Most adults should exercise for at least  150 minutes each week. The exercise should increase your heart rate and make you sweat (moderate-intensity exercise).  Most adults should also do strengthening exercises at least twice a week. This is in addition to the moderate-intensity exercise.  Maintain a healthy weight  Body mass index (BMI) is a measurement that can be used to identify possible weight problems. It estimates body fat based on height and weight. Your health care provider can help determine your BMI and help you achieve or maintain a healthy weight.  For females 68 years of age and older:   A BMI below 18.5 is considered underweight.  A BMI of 18.5 to 24.9 is normal.  A BMI of 25 to 29.9 is considered overweight.  A BMI of 30 and above is considered obese.  Watch levels of cholesterol and blood lipids  You should start having your blood tested for lipids and cholesterol at 69 years of age, then have this test every 5 years.  You may need to have your cholesterol levels checked more often if:  Your lipid or cholesterol levels are high.  You are older than 69 years of age.  You are at high risk for heart disease.  CANCER SCREENING   Lung Cancer  Lung cancer screening is recommended for adults 43-65 years old who are at high risk for lung cancer because of a history of smoking.  A yearly low-dose CT scan of the lungs  is recommended for people who:  Currently smoke.  Have quit within the past 15 years.  Have at least a 30-pack-year history of smoking. A pack year is smoking an average of one pack of cigarettes a day for 1 year.  Yearly screening should continue until it has been 15 years since you quit.  Yearly screening should stop if you develop a health problem that would prevent you from having lung cancer treatment.  Breast Cancer  Practice breast self-awareness. This means understanding how your breasts normally appear and feel.  It also means doing regular breast self-exams. Let your  health care provider know about any changes, no matter how small.  If you are in your 20s or 30s, you should have a clinical breast exam (CBE) by a health care provider every 1-3 years as part of a regular health exam.  If you are 69 or older, have a CBE every year. Also consider having a breast X-ray (mammogram) every year.  If you have a family history of breast cancer, talk to your health care provider about genetic screening.  If you are at high risk for breast cancer, talk to your health care provider about having an MRI and a mammogram every year.  Breast cancer gene (BRCA) assessment is recommended for women who have family members with BRCA-related cancers. BRCA-related cancers include:  Breast.  Ovarian.  Tubal.  Peritoneal cancers.  Results of the assessment will determine the need for genetic counseling and BRCA1 and BRCA2 testing. Cervical Cancer Your health care provider may recommend that you be screened regularly for cancer of the pelvic organs (ovaries, uterus, and vagina). This screening involves a pelvic examination, including checking for microscopic changes to the surface of your cervix (Pap test). You may be encouraged to have this screening done every 3 years, beginning at age 76.  For women ages 49-65, health care providers may recommend pelvic exams and Pap testing every 3 years, or they may recommend the Pap and pelvic exam, combined with testing for human papilloma virus (HPV), every 5 years. Some types of HPV increase your risk of cervical cancer. Testing for HPV may also be done on women of any age with unclear Pap test results.  Other health care providers may not recommend any screening for nonpregnant women who are considered low risk for pelvic cancer and who do not have symptoms. Ask your health care provider if a screening pelvic exam is right for you.  If you have had past treatment for cervical cancer or a condition that could lead to cancer, you need  Pap tests and screening for cancer for at least 20 years after your treatment. If Pap tests have been discontinued, your risk factors (such as having a new sexual partner) need to be reassessed to determine if screening should resume. Some women have medical problems that increase the chance of getting cervical cancer. In these cases, your health care provider may recommend more frequent screening and Pap tests. Colorectal Cancer  This type of cancer can be detected and often prevented.  Routine colorectal cancer screening usually begins at 69 years of age and continues through 69 years of age.  Your health care provider may recommend screening at an earlier age if you have risk factors for colon cancer.  Your health care provider may also recommend using home test kits to check for hidden blood in the stool.  A small camera at the end of a tube can be used to examine your colon  directly (sigmoidoscopy or colonoscopy). This is done to check for the earliest forms of colorectal cancer.  Routine screening usually begins at age 31.  Direct examination of the colon should be repeated every 5-10 years through 70 years of age. However, you may need to be screened more often if early forms of precancerous polyps or small growths are found. Skin Cancer  Check your skin from head to toe regularly.  Tell your health care provider about any new moles or changes in moles, especially if there is a change in a mole's shape or color.  Also tell your health care provider if you have a mole that is larger than the size of a pencil eraser.  Always use sunscreen. Apply sunscreen liberally and repeatedly throughout the day.  Protect yourself by wearing long sleeves, pants, a wide-brimmed hat, and sunglasses whenever you are outside. HEART DISEASE, DIABETES, AND HIGH BLOOD PRESSURE   High blood pressure causes heart disease and increases the risk of stroke. High blood pressure is more likely to develop  in:  People who have blood pressure in the high end of the normal range (130-139/85-89 mm Hg).  People who are overweight or obese.  People who are African American.  If you are 74-6 years of age, have your blood pressure checked every 3-5 years. If you are 35 years of age or older, have your blood pressure checked every year. You should have your blood pressure measured twice--once when you are at a hospital or clinic, and once when you are not at a hospital or clinic. Record the average of the two measurements. To check your blood pressure when you are not at a hospital or clinic, you can use:  An automated blood pressure machine at a pharmacy.  A home blood pressure monitor.  If you are between 8 years and 33 years old, ask your health care provider if you should take aspirin to prevent strokes.  Have regular diabetes screenings. This involves taking a blood sample to check your fasting blood sugar level.  If you are at a normal weight and have a low risk for diabetes, have this test once every three years after 69 years of age.  If you are overweight and have a high risk for diabetes, consider being tested at a younger age or more often. PREVENTING INFECTION  Hepatitis B  If you have a higher risk for hepatitis B, you should be screened for this virus. You are considered at high risk for hepatitis B if:  You were born in a country where hepatitis B is common. Ask your health care provider which countries are considered high risk.  Your parents were born in a high-risk country, and you have not been immunized against hepatitis B (hepatitis B vaccine).  You have HIV or AIDS.  You use needles to inject street drugs.  You live with someone who has hepatitis B.  You have had sex with someone who has hepatitis B.  You get hemodialysis treatment.  You take certain medicines for conditions, including cancer, organ transplantation, and autoimmune conditions. Hepatitis C  Blood  testing is recommended for:  Everyone born from [birth year] through 1964.  Anyone with known risk factors for hepatitis C. Sexually transmitted infections (STIs)  You should be screened for sexually transmitted infections (STIs) including gonorrhea and chlamydia if:  You are sexually active and are younger than 69 years of age.  You are older than 69 years of age and your health care provider tells  you that you are at risk for this type of infection.  Your sexual activity has changed since you were last screened and you are at an increased risk for chlamydia or gonorrhea. Ask your health care provider if you are at risk.  If you do not have HIV, but are at risk, it may be recommended that you take a prescription medicine daily to prevent HIV infection. This is called pre-exposure prophylaxis (PrEP). You are considered at risk if:  You are sexually active and do not regularly use condoms or know the HIV status of your partner(s).  You take drugs by injection.  You are sexually active with a partner who has HIV. Talk with your health care provider about whether you are at high risk of being infected with HIV. If you choose to begin PrEP, you should first be tested for HIV. You should then be tested every 3 months for as long as you are taking PrEP.  PREGNANCY   If you are premenopausal and you may become pregnant, ask your health care provider about preconception counseling.  If you may become pregnant, take 400 to 800 micrograms (mcg) of folic acid every day.  If you want to prevent pregnancy, talk to your health care provider about birth control (contraception). OSTEOPOROSIS AND MENOPAUSE   Osteoporosis is a disease in which the bones lose minerals and strength with aging. This can result in serious bone fractures. Your risk for osteoporosis can be identified using a bone density scan.  If you are 75 years of age or older, or if you are at risk for osteoporosis and fractures, ask your  health care provider if you should be screened.  Ask your health care provider whether you should take a calcium or vitamin D supplement to lower your risk for osteoporosis.  Menopause may have certain physical symptoms and risks.  Hormone replacement therapy may reduce some of these symptoms and risks. Talk to your health care provider about whether hormone replacement therapy is right for you.  HOME CARE INSTRUCTIONS   Schedule regular health, dental, and eye exams.  Stay current with your immunizations.   Do not use any tobacco products including cigarettes, chewing tobacco, or electronic cigarettes.  If you are pregnant, do not drink alcohol.  If you are breastfeeding, limit how much and how often you drink alcohol.  Limit alcohol intake to no more than 1 drink per day for nonpregnant women. One drink equals 12 ounces of beer, 5 ounces of wine, or 1 ounces of hard liquor.  Do not use street drugs.  Do not share needles.  Ask your health care provider for help if you need support or information about quitting drugs.  Tell your health care provider if you often feel depressed.  Tell your health care provider if you have ever been abused or do not feel safe at home.   This information is not intended to replace advice given to you by your health care provider. Make sure you discuss any questions you have with your health care provider.   Document Released: 01/30/2011 Document Revised: 08/07/2014 Document Reviewed: 06/18/2013 Elsevier Interactive Patient Education [employer].

## 2016-01-28 ENCOUNTER — Other Ambulatory Visit: Payer: Self-pay | Admitting: Nurse Practitioner

## 2016-01-31 ENCOUNTER — Encounter: Payer: Self-pay | Admitting: Nurse Practitioner

## 2016-01-31 ENCOUNTER — Ambulatory Visit (INDEPENDENT_AMBULATORY_CARE_PROVIDER_SITE_OTHER): Payer: Medicare Other | Admitting: Nurse Practitioner

## 2016-01-31 ENCOUNTER — Telehealth: Payer: Self-pay

## 2016-01-31 VITALS — BP 138/82 | HR 88 | Temp 97.3°F | Ht 62.0 in | Wt 137.0 lb

## 2016-01-31 DIAGNOSIS — F411 Generalized anxiety disorder: Secondary | ICD-10-CM

## 2016-01-31 DIAGNOSIS — E034 Atrophy of thyroid (acquired): Secondary | ICD-10-CM | POA: Diagnosis not present

## 2016-01-31 DIAGNOSIS — M329 Systemic lupus erythematosus, unspecified: Secondary | ICD-10-CM | POA: Diagnosis not present

## 2016-01-31 DIAGNOSIS — S4991XS Unspecified injury of right shoulder and upper arm, sequela: Secondary | ICD-10-CM

## 2016-01-31 DIAGNOSIS — F32A Depression, unspecified: Secondary | ICD-10-CM

## 2016-01-31 DIAGNOSIS — E038 Other specified hypothyroidism: Secondary | ICD-10-CM

## 2016-01-31 DIAGNOSIS — F329 Major depressive disorder, single episode, unspecified: Secondary | ICD-10-CM

## 2016-01-31 DIAGNOSIS — L255 Unspecified contact dermatitis due to plants, except food: Secondary | ICD-10-CM | POA: Diagnosis not present

## 2016-01-31 DIAGNOSIS — E785 Hyperlipidemia, unspecified: Secondary | ICD-10-CM

## 2016-01-31 MED ORDER — CHLORDIAZEPOXIDE HCL 25 MG PO CAPS: 25.0000 mg | ORAL_CAPSULE | Freq: Three times a day (TID) | ORAL | Status: DC | PRN

## 2016-01-31 MED ORDER — BUPROPION HCL 100 MG PO TABS
100.0000 mg | ORAL_TABLET | Freq: Three times a day (TID) | ORAL | Status: DC
Start: 2016-01-31 — End: 2016-12-29

## 2016-01-31 MED ORDER — LEVOTHYROXINE SODIUM 175 MCG PO TABS: 175.0000 ug | ORAL_TABLET | Freq: Every morning | ORAL | Status: DC

## 2016-01-31 MED ORDER — TRAMADOL HCL 50 MG PO TABS: 50.0000 mg | ORAL_TABLET | Freq: Three times a day (TID) | ORAL | Status: DC | PRN

## 2016-01-31 MED ORDER — METHYLPREDNISOLONE ACETATE 80 MG/ML IJ SUSP
80.0000 mg | Freq: Once | INTRAMUSCULAR | Status: AC
  Administered 2016-01-31: 80 mg via INTRAMUSCULAR

## 2016-01-31 NOTE — Telephone Encounter (Signed)
Wants to know if Hep B labs can be added to her blood work today. If so just add

## 2016-01-31 NOTE — Patient Instructions (Signed)
Contact Dermatitis Dermatitis is redness, soreness, and swelling (inflammation) of the skin. Contact dermatitis is a reaction to certain substances that touch the skin. There are two types of contact dermatitis:   Irritant contact dermatitis. This type is caused by something that irritates your skin, such as dry hands from washing them too much. This type does not require previous exposure to the substance for a reaction to occur. This type is more common.  Allergic contact dermatitis. This type is caused by a substance that you are allergic to, such as a nickel allergy or poison ivy. This type only occurs if you have been exposed to the substance (allergen) before. Upon a repeat exposure, your body reacts to the substance. This type is less common. CAUSES  Many different substances can cause contact dermatitis. Irritant contact dermatitis is most commonly caused by exposure to:   Makeup.   Soaps.   Detergents.   Bleaches.   Acids.   Metal salts, such as nickel.  Allergic contact dermatitis is most commonly caused by exposure to:   Poisonous plants.   Chemicals.   Jewelry.   Latex.   Medicines.   Preservatives in products, such as clothing.  RISK FACTORS This condition is more likely to develop in:   People who have jobs that expose them to irritants or allergens.  People who have certain medical conditions, such as asthma or eczema.  SYMPTOMS  Symptoms of this condition may occur anywhere on your body where the irritant has touched you or is touched by you. Symptoms include:  Dryness or flaking.   Redness.   Cracks.   Itching.   Pain or a burning feeling.   Blisters.  Drainage of small amounts of blood or clear fluid from skin cracks. With allergic contact dermatitis, there may also be swelling in areas such as the eyelids, mouth, or genitals.  DIAGNOSIS  This condition is diagnosed with a medical history and physical exam. A patch skin test  may be performed to help determine the cause. If the condition is related to your job, you may need to see an occupational medicine specialist. TREATMENT Treatment for this condition includes figuring out what caused the reaction and protecting your skin from further contact. Treatment may also include:   Steroid creams or ointments. Oral steroid medicines may be needed in more severe cases.  Antibiotics or antibacterial ointments, if a skin infection is present.  Antihistamine lotion or an antihistamine taken by mouth to ease itching.  A bandage (dressing). HOME CARE INSTRUCTIONS Skin Care  Moisturize your skin as needed.   Apply cool compresses to the affected areas.  Try taking a bath with:  Epsom salts. Follow the instructions on the packaging. You can get these at your local pharmacy or grocery store.  Baking soda. Pour a small amount into the bath as directed by your health care provider.  Colloidal oatmeal. Follow the instructions on the packaging. You can get this at your local pharmacy or grocery store.  Try applying baking soda paste to your skin. Stir water into baking soda until it reaches a paste-like consistency.  Do not scratch your skin.  Bathe less frequently, such as every other day.  Bathe in lukewarm water. Avoid using hot water. Medicines  Take or apply over-the-counter and prescription medicines only as told by your health care provider.   If you were prescribed an antibiotic medicine, take or apply your antibiotic as told by your health care provider. Do not stop using the   antibiotic even if your condition starts to improve. General Instructions  Keep all follow-up visits as told by your health care provider. This is important.  Avoid the substance that caused your reaction. If you do not know what caused it, keep a journal to try to track what caused it. Write down:  What you eat.  What cosmetic products you use.  What you drink.  What  you wear in the affected area. This includes jewelry.  If you were given a dressing, take care of it as told by your health care provider. This includes when to change and remove it. SEEK MEDICAL CARE IF:   Your condition does not improve with treatment.  Your condition gets worse.  You have signs of infection such as swelling, tenderness, redness, soreness, or warmth in the affected area.  You have a fever.  You have new symptoms. SEEK IMMEDIATE MEDICAL CARE IF:   You have a severe headache, neck pain, or neck stiffness.  You vomit.  You feel very sleepy.  You notice red streaks coming from the affected area.  Your bone or joint underneath the affected area becomes painful after the skin has healed.  The affected area turns darker.  You have difficulty breathing.   This information is not intended to replace advice given to you by your health care provider. Make sure you discuss any questions you have with your health care provider.   Document Released: 07/14/2000 Document Revised: 04/07/2015 Document Reviewed: 12/02/2014 Elsevier Interactive Patient Education 2016 Elsevier Inc.  

## 2016-01-31 NOTE — Progress Notes (Signed)
Subjective:    Patient ID: Kimberly Roth, female    DOB: [birth year], 70 y.o.   MRN: [id number]  Patient here today for follow up of chronic medical problems.  Outpatient Encounter Prescriptions as of 01/31/2016  Medication Sig  . acetaminophen (TYLENOL) 325 MG tablet Take 650 mg by mouth every 4 (four) hours as needed.  . Ascorbic Acid (VITAMIN C) 1000 MG tablet Take 1,000 mg by mouth 2 (two) times daily.  Marland Kitchen aspirin 325 MG EC tablet Take 325 mg by mouth 2 (two) times daily.  Marland Kitchen BIOTIN PO Take 1 tablet by mouth 2 (two) times daily.  Marland Kitchen buPROPion (WELLBUTRIN) 100 MG tablet TAKE 2 TABLETS EACH MORNING AND 1 TABLETEACH EVENING  . calcium-vitamin D (OSCAL WITH D) 250-125 MG-UNIT per tablet Take 1 tablet by mouth 2 (two) times daily.  . chlordiazePOXIDE (LIBRIUM) 25 MG capsule Take 1 capsule (25 mg total) by mouth 2 (two) times daily.  . cholecalciferol (VITAMIN D) 1000 UNITS tablet Take 1,000 Units by mouth 2 (two) times daily.  Marland Kitchen CRANBERRY EXTRACT PO Take 1 tablet by mouth 2 (two) times daily.  . diphenhydrAMINE (BENADRYL) 25 MG tablet Take 50 mg by mouth at bedtime as needed for sleep.  Marland Kitchen ibuprofen (ADVIL) 200 MG tablet Take 1-2 tablets (200-400 mg total) by mouth every 6 (six) hours as needed.  . ivermectin (STROMECTOL) 3 MG TABS tablet 1 po by mouth monthly  . levothyroxine (SYNTHROID, LEVOTHROID) 175 MCG tablet TAKE ONE TABLET EVERY MORNING  . Multiple Vitamins-Minerals (MULTIVITAMINS THER. W/MINERALS) TABS tablet Take 1 tablet by mouth 2 (two) times daily.  . multivitamin-lutein (OCUVITE-LUTEIN) CAPS capsule Take 1 capsule by mouth 2 (two) times daily.  . Omega-3 Fatty Acids (OMEGA 3 PO) Take 1 capsule by mouth 2 (two) times daily.  . Potassium Gluconate 595 MG CAPS Take 1 capsule by mouth daily.  . Thiamine HCl (VITAMIN B-1) 250 MG tablet Take 250 mg by mouth 2 (two) times daily.   No facility-administered encounter medications on file as of 01/31/2016.   * Patient fell at work in NOvember  and really tore tendons and ligaments in right shoulder and is currently still in a sling- probably going to have to have surgery. * rash on right upper arm and legs and abdomen- very itchy   Thyroid Problem Presents for initial visit. Symptoms include depressed mood. Patient reports no anxiety, fatigue, hair loss, heat intolerance, hoarse voice, palpitations or weight loss. The symptoms have been resolved. Past treatments include levothyroxine. The treatment provided significant relief. There is no history of hyperlipidemia or obesity.  Anxiety Presents for initial visit. Onset was more than 5 years ago. The problem has been resolved. Symptoms include depressed mood. Patient reports no insomnia, irritability, nervous/anxious behavior or palpitations. Symptoms occur rarely. The quality of sleep is fair. Nighttime awakenings: one to two.   Her past medical history is significant for depression. There is no history of bipolar disorder. Past treatments include SSRIs. The treatment provided significant relief. Compliance with prior treatments has been good.   Depression Pt on Wellbutrin '100mg'$ and librium- have been on current doses for many years and is not working as well anymore - would like to increase dose. Lupus Sees rheumatologist every 6 months. Currently on stromectrol monthly- helps some.    Review of Systems  Constitutional: Negative for weight loss, irritability and fatigue.  HENT: Negative for hoarse voice.   Cardiovascular: Negative for palpitations.  Endocrine: Negative for heat intolerance.  Psychiatric/Behavioral: The patient  is not nervous/anxious and does not have insomnia.   All other systems reviewed and are negative.      Objective:   Physical Exam  Constitutional: She is oriented to person, place, and time. She appears well-developed and well-nourished.  HENT:  Nose: Nose normal.  Mouth/Throat: Oropharynx is clear and moist.  Eyes: EOM are normal.  Neck: Trachea  normal, normal range of motion and full passive range of motion without pain. Neck supple. No JVD present. Carotid bruit is not present. No thyromegaly present.  Cardiovascular: Normal rate, regular rhythm, normal heart sounds and intact distal pulses.  Exam reveals no gallop and no friction rub.   No murmur heard. Pulmonary/Chest: Effort normal and breath sounds normal.  Abdominal: Soft. Bowel sounds are normal. She exhibits no distension and no mass. There is no tenderness.  Musculoskeletal: Normal range of motion.  Decrease ROM of right shoulder with pain on any movement Grips equal bil Motor strength and sensation distally intact  Lymphadenopathy:    She has no cervical adenopathy.  Neurological: She is alert and oriented to person, place, and time. She has normal reflexes.  Skin: Skin is warm and dry.  Vesicular lesion in linear pattern scattered on arms legs and trunk.  Psychiatric: She has a normal mood and affect. Her behavior is normal. Judgment and thought content normal.    BP 138/82 mmHg  Pulse 88  Temp(Src) 97.3 F (36.3 C) (Oral)  Ht '5\' 2"'$  (1.575 m)  Wt 137 lb (62.143 kg)  BMI 25.05 kg/m2     Assessment & Plan:  1. Hypothyroidism due to acquired atrophy of thyroid - levothyroxine (SYNTHROID, LEVOTHROID) 175 MCG tablet; Take 1 tablet (175 mcg total) by mouth every morning.  Dispense: 30 tablet; Refill: 5 - Thyroid Panel With TSH  2. Depression stress management Increased does of  Librium and wellbutrin from BID to TID - chlordiazePOXIDE (LIBRIUM) 25 MG capsule; Take 1 capsule (25 mg total) by mouth 3 (three) times daily as needed for anxiety.  Dispense: 90 capsule; Refill: 5 - buPROPion (WELLBUTRIN) 100 MG tablet; Take 1 tablet (100 mg total) by mouth 3 (three) times daily.  Dispense: 90 tablet; Refill: 5  3. GAD (generalized anxiety disorder) Stress management  4. Systemic lupus ([place])  5. Hyperlipidemia Low fat diet - CMP14+EGFR - Lipid panel  6. Contact  dermatitis due to plant Avoid scratching Cool compresses if needed - methylPREDNISolone acetate (DEPO-MEDROL) injection 80 mg; Inject 1 mL (80 mg total) into the muscle once.  7. Right shoulder injury, sequela Continue to wear sling and follow up with orthopedist - traMADol (ULTRAM) 50 MG tablet; Take 1 tablet (50 mg total) by mouth every 8 (eight) hours as needed.  Dispense: 60 tablet; Refill: 0   [name] Done, FNP

## 2016-01-31 NOTE — Telephone Encounter (Signed)
No reason to check for Hep B unless patient has reason.

## 2016-02-01 ENCOUNTER — Other Ambulatory Visit: Payer: Self-pay | Admitting: Nurse Practitioner

## 2016-02-01 LAB — LIPID PANEL
CHOL/HDL RATIO: 2.9 ratio (ref 0.0–4.4)
Cholesterol, Total: 225 mg/dL — ABNORMAL HIGH (ref 100–199)
HDL: 77 mg/dL (ref >39)
LDL Calculated: 124 mg/dL — ABNORMAL HIGH (ref 0–99)
Triglycerides: 119 mg/dL (ref 0–149)
VLDL Cholesterol Cal: 24 mg/dL (ref 5–40)

## 2016-02-01 LAB — CMP14+EGFR
A/G RATIO: 2 (ref 1.2–2.2)
ALBUMIN: 4.4 g/dL (ref 3.6–4.8)
ALT: 16 IU/L (ref 0–32)
AST: 22 IU/L (ref 0–40)
Alkaline Phosphatase: 60 IU/L (ref 39–117)
BUN / CREAT RATIO: 15 (ref 12–28)
BUN: 12 mg/dL (ref 8–27)
Bilirubin Total: 0.2 mg/dL (ref 0.0–1.2)
CALCIUM: 9.4 mg/dL (ref 8.7–10.3)
CO2: 25 mmol/L (ref 18–29)
Chloride: 95 mmol/L — ABNORMAL LOW (ref 96–106)
Creatinine, Ser: 0.82 mg/dL (ref 0.57–1.00)
GFR, EST AFRICAN AMERICAN: 85 mL/min/{1.73_m2} (ref >59)
GFR, EST NON AFRICAN AMERICAN: 74 mL/min/{1.73_m2} (ref >59)
GLOBULIN, TOTAL: 2.2 g/dL (ref 1.5–4.5)
Glucose: 94 mg/dL (ref 65–99)
POTASSIUM: 4.1 mmol/L (ref 3.5–5.2)
SODIUM: 135 mmol/L (ref 134–144)
TOTAL PROTEIN: 6.6 g/dL (ref 6.0–8.5)

## 2016-02-01 LAB — THYROID PANEL WITH TSH
FREE THYROXINE INDEX: 3.1 (ref 1.2–4.9)
T3 Uptake Ratio: 30 % (ref 24–39)
T4 TOTAL: 10.3 ug/dL (ref 4.5–12.0)
TSH: 0.065 u[IU]/mL — AB (ref 0.450–4.500)

## 2016-02-01 MED ORDER — LEVOTHYROXINE SODIUM 150 MCG PO CAPS: 1.0000 | ORAL_CAPSULE | Freq: Every day | ORAL | Status: DC

## 2016-02-02 NOTE — Telephone Encounter (Signed)
Pt notified that liver functions were nml Pt verbalizes understanding

## 2016-02-16 ENCOUNTER — Telehealth: Payer: Self-pay | Admitting: Nurse Practitioner

## 2016-02-16 NOTE — Telephone Encounter (Signed)
Got depo med on 01/31/16. NTBS again?

## 2016-02-16 NOTE — Telephone Encounter (Signed)
Pt aware ntbs  appt made

## 2016-02-16 NOTE — Telephone Encounter (Signed)
NTBS.

## 2016-02-17 ENCOUNTER — Encounter: Payer: Self-pay | Admitting: Nurse Practitioner

## 2016-02-17 ENCOUNTER — Ambulatory Visit (INDEPENDENT_AMBULATORY_CARE_PROVIDER_SITE_OTHER): Payer: Medicare Other | Admitting: Nurse Practitioner

## 2016-02-17 VITALS — BP 129/83 | HR 77 | Temp 97.0°F | Ht 62.0 in | Wt 134.0 lb

## 2016-02-17 DIAGNOSIS — L255 Unspecified contact dermatitis due to plants, except food: Secondary | ICD-10-CM | POA: Diagnosis not present

## 2016-02-17 MED ORDER — PREDNISONE 10 MG (21) PO TBPK: ORAL_TABLET | ORAL | Status: DC

## 2016-02-17 NOTE — Progress Notes (Signed)
   Subjective:    Patient ID: Kimberly Roth, female    DOB: 1946/03/30, 70 y.o.   MRN: [id number]  HPI Patient was seen on 01/31/16 for routine follow up and was c/o rash- was dx with contact dermatitis. SHe was given depomedrol shot. Still c/o rash which is spreading. Very itchy.    Review of Systems  Constitutional: Negative.   HENT: Negative.   Respiratory: Negative.   Cardiovascular: Negative.   Genitourinary: Negative.   Neurological: Negative.   Psychiatric/Behavioral: Negative.   All other systems reviewed and are negative.      Objective:   Physical Exam  Constitutional: She appears well-developed and well-nourished.  Cardiovascular: Normal rate, regular rhythm and normal heart sounds.   Pulmonary/Chest: Effort normal and breath sounds normal.  Skin: Skin is warm. Rash (scatterd vesicular lesions on bil upper thighs and left flank) noted.   BP 129/83 mmHg  Pulse 77  Temp(Src) 97 F (36.1 C) (Oral)  Ht 5\' 2"  (1.575 m)  Wt 134 lb (60.782 kg)  BMI 24.50 kg/m2        Assessment & Plan:   1. Contact dermatitis due to plant    Avoid scratching areas Cool compreses'calamine lotion Benadryl or zyrtec OTC for itching Meds ordered this encounter  Medications  . predniSONE (STERAPRED UNI-PAK 21 TAB) 10 MG (21) TBPK tablet    Sig: As directed x 6 days    Dispense:  21 tablet    Refill:  0    Order Specific Question:  Supervising Provider    Answer:  Eustaquio Maize [[id number]]   [name] Done, FNP

## 2016-02-17 NOTE — Patient Instructions (Signed)
Contact Dermatitis Dermatitis is redness, soreness, and swelling (inflammation) of the skin. Contact dermatitis is a reaction to certain substances that touch the skin. There are two types of contact dermatitis:   Irritant contact dermatitis. This type is caused by something that irritates your skin, such as dry hands from washing them too much. This type does not require previous exposure to the substance for a reaction to occur. This type is more common.  Allergic contact dermatitis. This type is caused by a substance that you are allergic to, such as a nickel allergy or poison ivy. This type only occurs if you have been exposed to the substance (allergen) before. Upon a repeat exposure, your body reacts to the substance. This type is less common. CAUSES  Many different substances can cause contact dermatitis. Irritant contact dermatitis is most commonly caused by exposure to:   Makeup.   Soaps.   Detergents.   Bleaches.   Acids.   Metal salts, such as nickel.  Allergic contact dermatitis is most commonly caused by exposure to:   Poisonous plants.   Chemicals.   Jewelry.   Latex.   Medicines.   Preservatives in products, such as clothing.  RISK FACTORS This condition is more likely to develop in:   People who have jobs that expose them to irritants or allergens.  People who have certain medical conditions, such as asthma or eczema.  SYMPTOMS  Symptoms of this condition may occur anywhere on your body where the irritant has touched you or is touched by you. Symptoms include:  Dryness or flaking.   Redness.   Cracks.   Itching.   Pain or a burning feeling.   Blisters.  Drainage of small amounts of blood or clear fluid from skin cracks. With allergic contact dermatitis, there may also be swelling in areas such as the eyelids, mouth, or genitals.  DIAGNOSIS  This condition is diagnosed with a medical history and physical exam. A patch skin test  may be performed to help determine the cause. If the condition is related to your job, you may need to see an occupational medicine specialist. TREATMENT Treatment for this condition includes figuring out what caused the reaction and protecting your skin from further contact. Treatment may also include:   Steroid creams or ointments. Oral steroid medicines may be needed in more severe cases.  Antibiotics or antibacterial ointments, if a skin infection is present.  Antihistamine lotion or an antihistamine taken by mouth to ease itching.  A bandage (dressing). HOME CARE INSTRUCTIONS Skin Care  Moisturize your skin as needed.   Apply cool compresses to the affected areas.  Try taking a bath with:  Epsom salts. Follow the instructions on the packaging. You can get these at your local pharmacy or grocery store.  Baking soda. Pour a small amount into the bath as directed by your health care provider.  Colloidal oatmeal. Follow the instructions on the packaging. You can get this at your local pharmacy or grocery store.  Try applying baking soda paste to your skin. Stir water into baking soda until it reaches a paste-like consistency.  Do not scratch your skin.  Bathe less frequently, such as every other day.  Bathe in lukewarm water. Avoid using hot water. Medicines  Take or apply over-the-counter and prescription medicines only as told by your health care provider.   If you were prescribed an antibiotic medicine, take or apply your antibiotic as told by your health care provider. Do not stop using the   antibiotic even if your condition starts to improve. General Instructions  Keep all follow-up visits as told by your health care provider. This is important.  Avoid the substance that caused your reaction. If you do not know what caused it, keep a journal to try to track what caused it. Write down:  What you eat.  What cosmetic products you use.  What you drink.  What  you wear in the affected area. This includes jewelry.  If you were given a dressing, take care of it as told by your health care provider. This includes when to change and remove it. SEEK MEDICAL CARE IF:   Your condition does not improve with treatment.  Your condition gets worse.  You have signs of infection such as swelling, tenderness, redness, soreness, or warmth in the affected area.  You have a fever.  You have new symptoms. SEEK IMMEDIATE MEDICAL CARE IF:   You have a severe headache, neck pain, or neck stiffness.  You vomit.  You feel very sleepy.  You notice red streaks coming from the affected area.  Your bone or joint underneath the affected area becomes painful after the skin has healed.  The affected area turns darker.  You have difficulty breathing.   This information is not intended to replace advice given to you by your health care provider. Make sure you discuss any questions you have with your health care provider.   Document Released: 07/14/2000 Document Revised: 04/07/2015 Document Reviewed: 12/02/2014 Elsevier Interactive Patient Education 2016 Elsevier Inc.  

## 2016-02-28 ENCOUNTER — Other Ambulatory Visit: Payer: Self-pay | Admitting: Nurse Practitioner

## 2016-02-29 ENCOUNTER — Other Ambulatory Visit: Payer: Self-pay | Admitting: Nurse Practitioner

## 2016-02-29 MED ORDER — MEBENDAZOLE 100 MG PO CHEW: 100.0000 mg | CHEWABLE_TABLET | Freq: Once | ORAL | 0 refills | Status: AC

## 2016-04-06 ENCOUNTER — Telehealth: Payer: Self-pay | Admitting: Nurse Practitioner

## 2016-04-06 NOTE — Telephone Encounter (Signed)
RX printed 02/27/16 5 Refills

## 2016-06-26 ENCOUNTER — Telehealth (INDEPENDENT_AMBULATORY_CARE_PROVIDER_SITE_OTHER): Payer: Self-pay | Admitting: Orthopaedic Surgery

## 2016-06-26 DIAGNOSIS — S4991XS Unspecified injury of right shoulder and upper arm, sequela: Secondary | ICD-10-CM

## 2016-06-26 NOTE — Telephone Encounter (Signed)
Rx refill Tramadol [facility], [place]

## 2016-06-27 MED ORDER — TRAMADOL HCL 50 MG PO TABS: 50.0000 mg | ORAL_TABLET | Freq: Three times a day (TID) | ORAL | 0 refills | Status: DC | PRN

## 2016-06-27 NOTE — Telephone Encounter (Signed)
ok 

## 2016-06-27 NOTE — Telephone Encounter (Signed)
Surgery is approved. Kimberly Roth requested a re-check appt with you before scheduling. She is on the schedule to see you in [place] this Thursday.

## 2016-06-27 NOTE — Telephone Encounter (Signed)
Please advise 

## 2016-06-27 NOTE — Telephone Encounter (Signed)
Okay to send her and 20 tablets. Please forward to Malachy Mood to find out where we stand on approval for shoulder surgery. She has had an IME who agreed with planned surgical treatment. This was a [employer]ician. Have checked Malachy Mood check where we stand on surgery approval please

## 2016-06-28 NOTE — Telephone Encounter (Signed)
Called script to pharmacy. I called patient to advise. Patient was curious as to why she was having to wait on requests. She states that her pharmacy has requested meds on numerous occasions and not gotten a return call. I did explain that all requests were coming through [facility] office now and that I had not seen a faxed request. She expressed understanding.

## 2016-06-29 ENCOUNTER — Encounter (INDEPENDENT_AMBULATORY_CARE_PROVIDER_SITE_OTHER): Payer: Self-pay | Admitting: Orthopaedic Surgery

## 2016-06-29 ENCOUNTER — Ambulatory Visit (INDEPENDENT_AMBULATORY_CARE_PROVIDER_SITE_OTHER): Payer: Worker's Compensation | Admitting: Orthopaedic Surgery

## 2016-06-29 DIAGNOSIS — S46211A Strain of muscle, fascia and tendon of other parts of biceps, right arm, initial encounter: Secondary | ICD-10-CM | POA: Insufficient documentation

## 2016-06-29 DIAGNOSIS — S46211D Strain of muscle, fascia and tendon of other parts of biceps, right arm, subsequent encounter: Secondary | ICD-10-CM

## 2016-06-29 DIAGNOSIS — M47812 Spondylosis without myelopathy or radiculopathy, cervical region: Secondary | ICD-10-CM | POA: Diagnosis not present

## 2016-06-29 NOTE — Progress Notes (Signed)
Office Visit Note   Patient: Kimberly Roth           Date of Birth: 1945-10-03           MRN: [id number] Visit Date: 06/29/2016              Requested by: [name], [place], [place] [address] PCP: [name], FNP   Assessment & Plan: Visit Diagnoses:  1. Traumatic partial tear of right biceps tendon, subsequent encounter   2. Spondylosis of cervical region without myelopathy or radiculopathy     Plan: We will proceed with the right shoulder outpatient arthroscopy under general anesthesia with preoperative interscalene block. Plan at the biceps tenodesis and inspection of the rotator cuff which by preoperative MRI does not appear that it will require repair. Questions were elicited and answered about surgery. He understands that she has some cervical spondylosis and that the shoulder surgery may not take care of the all her symptoms particularly those that radiate down closer to the wrist and fingers but her shoulder is the principal problem which has been extremely painful for her. There is been a delay in treatment of her care due to difficulty seeking approvals multiple attorneys involved etc.  Follow-Up Instructions: No Follow-up on file.   Orders:  No orders of the defined types were placed in this encounter.  No orders of the defined types were placed in this encounter.     Procedures: No procedures performed   Clinical Data: No additional findings.   Subjective: Chief Complaint  Patient presents with  . Right Shoulder - Follow-up    Patient returns for right shoulder pain. She had a Workman's Comp injury 06/03/2015. She has been awaiting approval through [employer] for surgery. This has now been approved and patient wants recheck prior to scheduling.    Patient original injury 06/03/2015 to the right shoulder well-documented in previous dictations. She's had persistent symptoms and when I saw her last year had  recommended shoulder surgery for the partial biceps tendon tear that was present as biceps tendon exits the shoulder. It appeared that a rotator cuff was intact she did have some partial tearing and some areas rotator cuff posterior aspect of supraspinatus and anterior aspect of the infraspinatus. It did not appear that she needed rotator cuff repair but I discussed with her due to ongoing problems and persistent pain that I recommend biceps release and tenodesis done in an outpatient setting. This procedure is typically done with general anesthesia with an interscalene block placed preoperatively by the anesthesiologist the patient has better pain relief postop and in most cases can then go home rather than having stay overnight. He said persistent symptoms has not gotten better. She's had a second opinion who has agreed with the outlined plan of care. 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 Again today in detail we discussed operative plan discussed the findings of the MRI scan the partial biceps tendon tear with diagrams of shoulder anatomy and discussed the surgical plan in detail. Patient lives with her husband.  Review of Systems  Constitutional: Negative for chills and diaphoresis.  HENT: Negative for ear discharge, ear pain and nosebleeds.   Eyes: Negative for discharge and visual disturbance.  Respiratory: Negative for cough, choking and shortness of breath.   Cardiovascular: Negative for chest pain and palpitations.  Gastrointestinal: Negative for abdominal distention and abdominal pain.  Endocrine: Negative for cold intolerance and  heat intolerance.  Genitourinary: Negative for flank pain and hematuria.  Skin: Negative for rash and wound.  Neurological: Negative for seizures and speech difficulty.  Hematological: Negative for adenopathy. Does not bruise/bleed easily.  Psychiatric/Behavioral: Negative for agitation and suicidal ideas.     Objective: Vital  Signs: There were no vitals taken for this visit.  Physical Exam  Constitutional: She is oriented to person, place, and time. She appears well-developed.  HENT:  Head: Normocephalic.  Right Ear: External ear normal.  Left Ear: External ear normal.  Eyes: Pupils are equal, round, and reactive to light.  Neck: No tracheal deviation present. No thyromegaly present.  Cardiovascular: Normal rate.   Pulmonary/Chest: Effort normal.  Abdominal: Soft.  Musculoskeletal:  Patient had a previous left total knee arthroplasty with revision surgery which is functioning well. Well-healed anterior incision.  Neurological: She is alert and oriented to person, place, and time.  Skin: Skin is warm and dry.  Psychiatric: She has a normal mood and affect. Her behavior is normal.    Ortho Exam patient has exquisite tenderness over the anterior aspect of the shoulder long head of biceps tendon and the recoils. She has trace deltoid atrophy in comparison to her opposite left shoulder. Positive impingement test with positive Hawkins. Negative  Yergason test. Number pain radiates down her forearm. She has intact reflexes. Cessation of her hand is intact. Capillary refill normal pulses at the wrist.  Specialty Comments:  No specialty comments available.  Imaging: No results found. Previous imaging results were reviewed as mentioned above. She's also had plain radiographs of the cervical spine which shows multilevel degenerative changes in the cervical spine with some disc narrowing and some levels with some anterolisthesis. She has not had a cervical MRI.  PMFS History: Patient Active Problem List   Diagnosis Date Noted  . Traumatic partial tear of right biceps tendon 06/29/2016  . Spondylosis of cervical region without myelopathy or radiculopathy 06/29/2016  . Systemic lupus ([place]) 09/02/2014  . Hyperlipidemia 09/02/2014  . Hypothyroidism 04/09/2013  . Depression 04/09/2013  . GAD (generalized anxiety  disorder) 04/09/2013   Past Medical History:  Diagnosis Date  . Anxiety   . Breast cancer ([place])    breast bilateral  . Cataract   . History of shingles   . Hyperlipidemia   . PTSD (post-traumatic stress disorder)    related to childhood trauma  . Systemic lupus ([place])   . Uterine cancer ([place])     Family History  Problem Relation Age of Onset  . Heart failure Father   . Heart disease Father   . Heart attack Father 36  . Mental illness Mother   . Mental illness Sister     PTSD  . Osteoporosis Paternal Grandmother     Past Surgical History:  Procedure Laterality Date  . ABDOMINAL HYSTERECTOMY  1976   no oopherectomy  . APPENDECTOMY    . BACK SURGERY    . BREAST IMPLANT REMOVAL Bilateral    Silicone allergy  . BREAST SURGERY Bilateral 1991   Mastectomy  . CATARACT EXTRACTION W/PHACO Right 10/30/2013   Procedure: CATARACT EXTRACTION PHACO AND INTRAOCULAR LENS PLACEMENT (IOC);  Surgeon: Tonny Branch, MD;  Location: [facility];  Service: Ophthalmology;  Laterality: Right;  CDE:10.13  . CATARACT EXTRACTION W/PHACO Left 11/10/2013   Procedure: CATARACT EXTRACTION PHACO AND INTRAOCULAR LENS PLACEMENT (IOC);  Surgeon: Tonny Branch, MD;  Location: [facility];  Service: Ophthalmology;  Laterality: Left;  CDE 9.71  . DILATION AND CURETTAGE OF UTERUS    .  EYE SURGERY Bilateral 2015   Cataract - Dr Geoffry Paradise in [place]  . JOINT REPLACEMENT Left 2005   Knee x 2  . MASTECTOMY Bilateral   . PLACEMENT OF BREAST IMPLANTS Bilateral    Secondary to cancer  . RECONSTRUCTION / CORRECTION OF NIPPLE / AEROLA Bilateral   . TONSILLECTOMY    . WISDOM TOOTH EXTRACTION Bilateral    Extraction x 4  . WRIST SURGERY Right 20's   patient not exactly sure of reason   Social History   Occupational History  . Not on file.   Social History Main Topics  . Smoking status: Never Smoker  . Smokeless tobacco: Never Used  . Alcohol use 0.6 oz/week    1 Glasses of wine per week     Comment: 1 or 2 times per year  .  Drug use: No  . Sexual activity: Yes

## 2016-07-19 ENCOUNTER — Telehealth (INDEPENDENT_AMBULATORY_CARE_PROVIDER_SITE_OTHER): Payer: Self-pay | Admitting: Orthopaedic Surgery

## 2016-07-19 DIAGNOSIS — S4991XS Unspecified injury of right shoulder and upper arm, sequela: Secondary | ICD-10-CM

## 2016-07-19 NOTE — Telephone Encounter (Signed)
Patient's husband Eddie Dibbles) called advised patient need Rx refilled (tramadol) He said she took the last tab today. They use ([facility] in [place]. The number to contact Eddie Dibbles is [phone]

## 2016-07-19 NOTE — Telephone Encounter (Signed)
Please advise 

## 2016-07-20 MED ORDER — TRAMADOL HCL 50 MG PO TABS: 50.0000 mg | ORAL_TABLET | Freq: Three times a day (TID) | ORAL | 0 refills | Status: DC | PRN

## 2016-07-20 NOTE — Telephone Encounter (Signed)
OK refill ultram thanks

## 2016-07-20 NOTE — Telephone Encounter (Signed)
I called script in to pharmacy. I entered into EPIC. I called patient's husband and advised.

## 2016-07-20 NOTE — Addendum Note (Signed)
Addended by: Meyer Cory on: 07/20/2016 02:42 PM   Modules accepted: Orders

## 2016-08-07 ENCOUNTER — Encounter: Payer: Self-pay | Admitting: Orthopaedic Surgery

## 2016-08-07 DIAGNOSIS — M7521 Bicipital tendinitis, right shoulder: Secondary | ICD-10-CM | POA: Diagnosis not present

## 2016-08-07 DIAGNOSIS — M75111 Incomplete rotator cuff tear or rupture of right shoulder, not specified as traumatic: Secondary | ICD-10-CM | POA: Diagnosis not present

## 2016-08-17 ENCOUNTER — Inpatient Hospital Stay (INDEPENDENT_AMBULATORY_CARE_PROVIDER_SITE_OTHER): Payer: Self-pay | Admitting: Orthopaedic Surgery

## 2016-08-18 ENCOUNTER — Telehealth (INDEPENDENT_AMBULATORY_CARE_PROVIDER_SITE_OTHER): Payer: Self-pay | Admitting: *Deleted

## 2016-08-18 NOTE — Telephone Encounter (Signed)
Pt called she lives on a [address] that is not cleared and lives in [place]. 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 Pt requesting call back

## 2016-08-18 NOTE — Telephone Encounter (Signed)
[place] for patient to remove sutures? Please advise.

## 2016-08-18 NOTE — Telephone Encounter (Signed)
I called discussed.  

## 2016-08-29 ENCOUNTER — Telehealth (INDEPENDENT_AMBULATORY_CARE_PROVIDER_SITE_OTHER): Payer: Self-pay | Admitting: Orthopaedic Surgery

## 2016-08-29 ENCOUNTER — Other Ambulatory Visit (INDEPENDENT_AMBULATORY_CARE_PROVIDER_SITE_OTHER): Payer: Self-pay

## 2016-08-29 DIAGNOSIS — S4991XS Unspecified injury of right shoulder and upper arm, sequela: Secondary | ICD-10-CM

## 2016-08-29 MED ORDER — TRAMADOL HCL 50 MG PO TABS: 50.0000 mg | ORAL_TABLET | Freq: Three times a day (TID) | ORAL | 0 refills | Status: DC | PRN

## 2016-08-29 NOTE — Telephone Encounter (Signed)
Ok to refill 

## 2016-08-29 NOTE — Telephone Encounter (Signed)
Called into pharmacy

## 2016-08-29 NOTE — Telephone Encounter (Signed)
PATIENTS HUSBAND PAUL CALLED WANTING TO REFILL HIS WIFES TRAMADOL. CB # [phone]

## 2016-08-29 NOTE — Telephone Encounter (Signed)
Ok refill thanks 

## 2016-08-31 ENCOUNTER — Telehealth (INDEPENDENT_AMBULATORY_CARE_PROVIDER_SITE_OTHER): Payer: Self-pay | Admitting: Radiology

## 2016-08-31 ENCOUNTER — Encounter (INDEPENDENT_AMBULATORY_CARE_PROVIDER_SITE_OTHER): Payer: Self-pay | Admitting: Orthopaedic Surgery

## 2016-08-31 ENCOUNTER — Ambulatory Visit (INDEPENDENT_AMBULATORY_CARE_PROVIDER_SITE_OTHER): Payer: Medicare Other | Admitting: Orthopaedic Surgery

## 2016-08-31 VITALS — BP 149/87 | HR 86 | Ht 63.5 in | Wt 128.0 lb

## 2016-08-31 DIAGNOSIS — S46211D Strain of muscle, fascia and tendon of other parts of biceps, right arm, subsequent encounter: Secondary | ICD-10-CM

## 2016-08-31 MED ORDER — DICLOFENAC SODIUM 75 MG PO TBEC: 75.0000 mg | DELAYED_RELEASE_TABLET | Freq: Two times a day (BID) | ORAL | 0 refills | Status: DC

## 2016-08-31 NOTE — Addendum Note (Signed)
Addended by: Meyer Cory on: 08/31/2016 02:20 PM   Modules accepted: Orders

## 2016-08-31 NOTE — Telephone Encounter (Signed)
Pt being scheduled for outpatient PT . Requires WC auth. Info sent to [place].

## 2016-08-31 NOTE — Progress Notes (Addendum)
Post-Op Visit Note   Patient: Kimberly Roth           Date of Birth: 09-May-1946           MRN: [id number] Visit Date: 08/31/2016 PCP: [name], FNP   Assessment & Plan: Patient returns post biceps tenodesis 08/07/2016 for partially torn biceps tendon tear that was intra-articular. This dates back over a year and his Worker's Comp. injury. Appropriate surgery was delayed. Her incision is well-healed. She still using a sling. Prescription given to start some physical therapy at the [facility] for passive and active assistive elbow range of motion but no resistive biceps work until she 6 weeks out from the surgery. Due to the length of time she's been in a sling she'll need some passive range of motion exercises on her right shoulder. She notes when she moves her shoulder around she has some clicking and popping.  Chief Complaint:  Chief Complaint  Patient presents with  . Right Shoulder - Routine Post Op   Visit Diagnoses:  1. Traumatic partial tear of right biceps tendon, subsequent encounter     Plan: We'll start some physical therapy or recheck her in 3 weeks. At that time should be able to discontinue the sling. I gave her instructions that when she is at home on her own she can remove the sling to work on elbow extension exercises.Patient's off the Percocet. Ultram was sent in for pain and she can occasionally use. Full tear and 75 mg one by mouth twice a day with meals #60 tablets prescribed no refills. She'll take this to help with the inflammation. She can use some ice off and on as needed. Recheck 3 weeks.  Follow-Up Instructions: No Follow-up on file.   Orders:  No orders of the defined types were placed in this encounter.  No orders of the defined types were placed in this encounter.  HPI Patient returns for post op visit. She is status post right shoulder arthroscopy 08/07/2016.  She states that the pain still wakes her at night. She is taking 2-3 tramadol a  day for pain. The last suture was removed today.   Imaging: No results found.  PMFS History: Patient Active Problem List   Diagnosis Date Noted  . Traumatic partial tear of right biceps tendon 06/29/2016  . Spondylosis of cervical region without myelopathy or radiculopathy 06/29/2016  . Systemic lupus ([place]) 09/02/2014  . Hyperlipidemia 09/02/2014  . Hypothyroidism 04/09/2013  . Depression 04/09/2013  . GAD (generalized anxiety disorder) 04/09/2013   Past Medical History:  Diagnosis Date  . Anxiety   . Breast cancer ([place])    breast bilateral  . Cataract   . History of shingles   . Hyperlipidemia   . PTSD (post-traumatic stress disorder)    related to childhood trauma  . Systemic lupus ([place])   . Uterine cancer ([place])     Family History  Problem Relation Age of Onset  . Heart failure Father   . Heart disease Father   . Heart attack Father 44  . Mental illness Mother   . Mental illness Sister     PTSD  . Osteoporosis Paternal Grandmother     Past Surgical History:  Procedure Laterality Date  . ABDOMINAL HYSTERECTOMY  1976   no oopherectomy  . APPENDECTOMY    . BACK SURGERY    . BREAST IMPLANT REMOVAL Bilateral    Silicone allergy  . BREAST SURGERY Bilateral 1991   Mastectomy  . CATARACT  EXTRACTION W/PHACO Right 10/30/2013   Procedure: CATARACT EXTRACTION PHACO AND INTRAOCULAR LENS PLACEMENT (IOC);  Surgeon: Tonny Branch, MD;  Location: [facility];  Service: Ophthalmology;  Laterality: Right;  CDE:10.13  . CATARACT EXTRACTION W/PHACO Left 11/10/2013   Procedure: CATARACT EXTRACTION PHACO AND INTRAOCULAR LENS PLACEMENT (IOC);  Surgeon: Tonny Branch, MD;  Location: [facility];  Service: Ophthalmology;  Laterality: Left;  CDE 9.71  . DILATION AND CURETTAGE OF UTERUS    . EYE SURGERY Bilateral 2015   Cataract - Dr Geoffry Paradise in [place]  . JOINT REPLACEMENT Left 2005   Knee x 2  . MASTECTOMY Bilateral   . PLACEMENT OF BREAST IMPLANTS Bilateral    Secondary to cancer  . RECONSTRUCTION  / CORRECTION OF NIPPLE / AEROLA Bilateral   . TONSILLECTOMY    . WISDOM TOOTH EXTRACTION Bilateral    Extraction x 4  . WRIST SURGERY Right 20's   patient not exactly sure of reason   Social History   Occupational History  . Not on file.   Social History Main Topics  . Smoking status: Never Smoker  . Smokeless tobacco: Never Used  . Alcohol use 0.6 oz/week    1 Glasses of wine per week     Comment: 1 or 2 times per year  . Drug use: No  . Sexual activity: Yes

## 2016-09-03 ENCOUNTER — Other Ambulatory Visit: Payer: Self-pay | Admitting: Nurse Practitioner

## 2016-09-03 DIAGNOSIS — F329 Major depressive disorder, single episode, unspecified: Secondary | ICD-10-CM

## 2016-09-03 DIAGNOSIS — F32A Depression, unspecified: Secondary | ICD-10-CM

## 2016-09-04 ENCOUNTER — Other Ambulatory Visit: Payer: Self-pay

## 2016-09-04 DIAGNOSIS — F329 Major depressive disorder, single episode, unspecified: Secondary | ICD-10-CM

## 2016-09-04 DIAGNOSIS — F32A Depression, unspecified: Secondary | ICD-10-CM

## 2016-09-04 MED ORDER — CHLORDIAZEPOXIDE HCL 25 MG PO CAPS: ORAL_CAPSULE | ORAL | 1 refills | Status: DC

## 2016-09-05 ENCOUNTER — Telehealth (INDEPENDENT_AMBULATORY_CARE_PROVIDER_SITE_OTHER): Payer: Self-pay | Admitting: Orthopaedic Surgery

## 2016-09-05 NOTE — Telephone Encounter (Signed)
Please fax work status paper work to [phone].  This is in regards to claim [id number].  Patient Kimberly Roth, DOB April 11, 1946.  Thank you.

## 2016-09-06 NOTE — Telephone Encounter (Signed)
Have given claim number and advised physical therapy approval is pending for her.  Have advised them Amy Lyndel Safe is working on the approval.

## 2016-09-11 ENCOUNTER — Ambulatory Visit: Payer: Worker's Compensation | Attending: Orthopaedic Surgery | Admitting: Physical Therapy

## 2016-09-11 ENCOUNTER — Telehealth (INDEPENDENT_AMBULATORY_CARE_PROVIDER_SITE_OTHER): Payer: Self-pay

## 2016-09-11 DIAGNOSIS — M25611 Stiffness of right shoulder, not elsewhere classified: Secondary | ICD-10-CM | POA: Insufficient documentation

## 2016-09-11 DIAGNOSIS — G8929 Other chronic pain: Secondary | ICD-10-CM | POA: Diagnosis present

## 2016-09-11 DIAGNOSIS — M25511 Pain in right shoulder: Secondary | ICD-10-CM | POA: Insufficient documentation

## 2016-09-11 NOTE — Telephone Encounter (Signed)
Received voicemail requesting the 08/31/16 office note to be faxed to [facility] [phone].Faxed to [phone].

## 2016-09-11 NOTE — Therapy (Signed)
[facility] [place], [place], [address] Phone: [phone]   Fax:  [phone]  Physical Therapy Evaluation  Patient Details  Name: Kimberly Roth MRN: [id number] Date of Birth: [birth year] Referring Provider: Rodell Perna MD.  Encounter Date: 09/11/2016      PT End of Session - 09/11/16 1620    Visit Number 1   Number of Visits 8  Current WC approval.   Date for PT Re-Evaluation 10/09/16   PT Start Time 0145   PT Stop Time 0233   PT Time Calculation (min) 48 min   Activity Tolerance Patient tolerated treatment well   Behavior During Therapy [facility] for tasks assessed/performed      Past Medical History:  Diagnosis Date  . Anxiety   . Breast cancer ([place])    breast bilateral  . Cataract   . History of shingles   . Hyperlipidemia   . PTSD (post-traumatic stress disorder)    related to childhood trauma  . Systemic lupus ([place])   . Uterine cancer [facility])     Past Surgical History:  Procedure Laterality Date  . ABDOMINAL HYSTERECTOMY  1976   no oopherectomy  . APPENDECTOMY    . BACK SURGERY    . BREAST IMPLANT REMOVAL Bilateral    Silicone allergy  . BREAST SURGERY Bilateral 1991   Mastectomy  . CATARACT EXTRACTION W/PHACO Right 10/30/2013   Procedure: CATARACT EXTRACTION PHACO AND INTRAOCULAR LENS PLACEMENT (IOC);  Surgeon: Tonny Branch, MD;  Location: [facility];  Service: Ophthalmology;  Laterality: Right;  CDE:10.13  . CATARACT EXTRACTION W/PHACO Left 11/10/2013   Procedure: CATARACT EXTRACTION PHACO AND INTRAOCULAR LENS PLACEMENT (IOC);  Surgeon: Tonny Branch, MD;  Location: [facility];  Service: Ophthalmology;  Laterality: Left;  CDE 9.71  . DILATION AND CURETTAGE OF UTERUS    . EYE SURGERY Bilateral 2015   Cataract - Dr Geoffry Paradise in [place]  . JOINT REPLACEMENT Left 2005   Knee x 2  . MASTECTOMY Bilateral   . PLACEMENT OF BREAST IMPLANTS Bilateral    Secondary to cancer  . RECONSTRUCTION / CORRECTION OF NIPPLE / AEROLA  Bilateral   . TONSILLECTOMY    . WISDOM TOOTH EXTRACTION Bilateral    Extraction x 4  . WRIST SURGERY Right 20's   patient not exactly sure of reason    There were no vitals filed for this visit.       Subjective Assessment - 09/11/16 1436    Subjective The patient prsents to OPPT s/p right shoulder biceps tendon repair performed on 08/07/16.  This is a work related injury that occured on 06/02/16.  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  She has been doing the pendulum and a passive right elbow stretch using her left UE to perform the stretch.  She is compliant to her sling.  Herresting pain-level today is a 6/10 and 10+/10 with movement out of her sling.  Pain and n ot moving her arm helps decrease her pain.   Patient Stated Goals Regain use of right UE.   Currently in Pain? Yes   Pain Score 6    Pain Location Shoulder   Pain Orientation Right   Pain Descriptors / Indicators Aching;Sharp   Pain Type Chronic pain   Pain Onset More than a month ago   Pain Frequency Constant   Aggravating Factors  See above.   Pain Relieving Factors See above.  [facility] PT Assessment - 09/11/16 0001      Assessment   Medical Diagnosis Traumatic tear of right biceps tendon.   Referring Provider Rodell Perna MD.   Onset Date/Surgical Date --  08/07/16 (surgery date).     Precautions   Precaution Comments MD order:  Passive right elbow flex/ext and and passive supine shoulder ROM.     Restrictions   Weight Bearing Restrictions No     Balance Screen   Has the patient fallen in the past 6 months No   Has the patient had a decrease in activity level because of a fear of falling?  No   Is the patient reluctant to leave their home because of a fear of falling?  No     Prior Function   Level of Independence Independent     Observation/Other Assessments   Observations --  Right shoulder incisional sites are well healed.      Posture/Postural Control   Posture Comments Very guarded pain posture.  Right GHJ moved forward due to right scapular protraction and shoulder elevation.     ROM / Strength   AROM / PROM / Strength PROM     PROM   Overall PROM Comments Passive right shoulder flexion in supine= 5 degrees; ER= 2 degrees and right elbow extension= -45 degrees.  Patient reported extreme pain with very gentle PROM.     Palpation   Palpation comment Patient very sensitive to even light touch in the area of her right anterior and posterior right shoulder.                   [facility] Adult PT Treatment/Exercise - 09/11/16 0001      Modalities   Modalities Electrical Stimulation;Moist Heat     Moist Heat Therapy   Number Minutes Moist Heat 15 Minutes   Moist Heat Location --  RT shoulder.     Acupuncturist Location --  Ant/post right shoulder.   Electrical Stimulation Action Low-level, non-motoric pre-mod.   Electrical Stimulation Parameters 80-150 Hz.   Electrical Stimulation Goals Pain                     PT Long Term Goals - 09/11/16 1643      PT LONG TERM GOAL #1   Title Independent with an HEP.   Time 4   Period Weeks   Status New     PT LONG TERM GOAL #2   Title Active right shoulder flexion to 145 degrees so the patient can easily reach overhead.   Time 4   Period Weeks   Status New     PT LONG TERM GOAL #3   Title Active ER to 70 degrees+ to allow for easily donning/doffing of apparel   Time 4   Period Weeks   Status New     PT LONG TERM GOAL #4   Title Full right elbow extension.   Time 4   Period Weeks   Status New     PT LONG TERM GOAL #5   Title Perform ADL's with pain not > 3/10.   Time 4   Period Weeks   Status New               Plan - 09/11/16 1632    Clinical Impression Statement The patient is s/p right biceps tendonesis on 08/07/16.  She is severely limited with regards to right elbow and right  shoulder range of motion.  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  She reports intense pain with even very gentle PROM.   Rehab Potential Good   PT Frequency 2x / week   PT Duration 4 weeks   PT Treatment/Interventions ADLs/Self Care Home Management;Electrical Stimulation;Moist Heat;Ultrasound;Patient/family education;Therapeutic exercise;Therapeutic activities;Passive range of motion   PT Next Visit Plan MD order:  Elbow passive flex and extension, passive supine shoulder ROM.  HMP and low-level electrical stimulation.  No CP's.   Consulted and Agree with Plan of Care Patient      Patient will benefit from skilled therapeutic intervention in order to improve the following deficits and impairments:  Pain, Decreased activity tolerance, Decreased range of motion  Visit Diagnosis: Chronic right shoulder pain - Plan: PT plan of care cert/re-cert  Stiffness of right shoulder, not elsewhere classified - Plan: PT plan of care cert/re-cert     Problem List Patient Active Problem List   Diagnosis Date Noted  . Traumatic partial tear of right biceps tendon 06/29/2016  . Spondylosis of cervical region without myelopathy or radiculopathy 06/29/2016  . Systemic lupus ([place]) 09/02/2014  . Hyperlipidemia 09/02/2014  . Hypothyroidism 04/09/2013  . Depression 04/09/2013  . GAD (generalized anxiety disorder) 04/09/2013    Connie Hilgert, [place] MPT 09/11/2016, 4:47 PM  [facility] [address] [place], [place], [address] Phone: [phone]   Fax:  [phone]  Name: ADDISON ABBONDANZA MRN: [id number] Date of Birth: 05-Jul-1946

## 2016-09-15 ENCOUNTER — Ambulatory Visit: Payer: Worker's Compensation | Admitting: Physical Therapy

## 2016-09-15 DIAGNOSIS — G8929 Other chronic pain: Secondary | ICD-10-CM

## 2016-09-15 DIAGNOSIS — M25511 Pain in right shoulder: Principal | ICD-10-CM

## 2016-09-15 DIAGNOSIS — M25611 Stiffness of right shoulder, not elsewhere classified: Secondary | ICD-10-CM

## 2016-09-15 NOTE — Therapy (Signed)
[facility] [place], [place], [address] Phone: [phone]   Fax:  [phone]  Physical Therapy Treatment  Patient Details  Name: Kimberly Roth MRN: [id number] Date of Birth: [birth year] Referring Provider: Rodell Perna MD.  Encounter Date: 09/15/2016      PT End of Session - 09/15/16 1033    Visit Number 2   Number of Visits 8   Date for PT Re-Evaluation 10/09/16   PT Start Time 1033   PT Stop Time 1118   PT Time Calculation (min) 45 min   Activity Tolerance Patient limited by pain   Behavior During Therapy [facility] for tasks assessed/performed      Past Medical History:  Diagnosis Date  . Anxiety   . Breast cancer ([place])    breast bilateral  . Cataract   . History of shingles   . Hyperlipidemia   . PTSD (post-traumatic stress disorder)    related to childhood trauma  . Systemic lupus ([place])   . Uterine cancer [facility])     Past Surgical History:  Procedure Laterality Date  . ABDOMINAL HYSTERECTOMY  1976   no oopherectomy  . APPENDECTOMY    . BACK SURGERY    . BREAST IMPLANT REMOVAL Bilateral    Silicone allergy  . BREAST SURGERY Bilateral 1991   Mastectomy  . CATARACT EXTRACTION W/PHACO Right 10/30/2013   Procedure: CATARACT EXTRACTION PHACO AND INTRAOCULAR LENS PLACEMENT (IOC);  Surgeon: Tonny Branch, MD;  Location: [facility];  Service: Ophthalmology;  Laterality: Right;  CDE:10.13  . CATARACT EXTRACTION W/PHACO Left 11/10/2013   Procedure: CATARACT EXTRACTION PHACO AND INTRAOCULAR LENS PLACEMENT (IOC);  Surgeon: Tonny Branch, MD;  Location: [facility];  Service: Ophthalmology;  Laterality: Left;  CDE 9.71  . DILATION AND CURETTAGE OF UTERUS    . EYE SURGERY Bilateral 2015   Cataract - Dr Geoffry Paradise in [place]  . JOINT REPLACEMENT Left 2005   Knee x 2  . MASTECTOMY Bilateral   . PLACEMENT OF BREAST IMPLANTS Bilateral    Secondary to cancer  . RECONSTRUCTION / CORRECTION OF NIPPLE / AEROLA Bilateral   . TONSILLECTOMY    .  WISDOM TOOTH EXTRACTION Bilateral    Extraction x 4  . WRIST SURGERY Right 20's   patient not exactly sure of reason    There were no vitals filed for this visit.      Subjective Assessment - 09/15/16 1033    Subjective The patient prsents to OPPT s/p right shoulder biceps tendon repair performed on 08/07/16.  This is a work related injury that occured on 06/02/16.    Patient Stated Goals Regain use of right UE.   Pain Score 5    Pain Location Shoulder   Pain Orientation Right   Pain Descriptors / Indicators Aching;Sharp   Pain Type Chronic pain   Pain Onset More than a month ago   Pain Frequency Constant   Aggravating Factors  any movement, touch   Pain Relieving Factors meds                         [facility] Adult PT Treatment/Exercise - 09/15/16 0001      Self-Care   Self-Care Other Self-Care Comments   Other Self-Care Comments  desensitization techniques using varying fabrics; breathing techniques to decrease muscle tone and tension in UT, pecs and parascapular muscles.      Modalities   Modalities Electrical Stimulation     Moist Heat Therapy  Number Minutes Moist Heat 15 Minutes   Moist Heat Location Shoulder     Electrical Stimulation   Electrical Stimulation Location R shoulder Premod 80-150 hz x 15 min to tolerance   Electrical Stimulation Goals Pain     Manual Therapy   Manual Therapy Passive ROM   Passive ROM Right elbow; R shoulder ER to neutral, flex (minimal)                PT Education - 09/15/16 1130    Education provided Yes   Education Details see self care   Person(s) Educated Patient   Methods Explanation;Demonstration   Comprehension Verbalized understanding;Returned demonstration             PT Long Term Goals - 09/11/16 1643      PT LONG TERM GOAL #1   Title Independent with an HEP.   Time 4   Period Weeks   Status New     PT LONG TERM GOAL #2   Title Active right shoulder flexion to 145 degrees so the  patient can easily reach overhead.   Time 4   Period Weeks   Status New     PT LONG TERM GOAL #3   Title Active ER to 70 degrees+ to allow for easily donning/doffing of apparel   Time 4   Period Weeks   Status New     PT LONG TERM GOAL #4   Title Full right elbow extension.   Time 4   Period Weeks   Status New     PT LONG TERM GOAL #5   Title Perform ADL's with pain not > 3/10.   Time 4   Period Weeks   Status New               Plan - 09/15/16 1139    Clinical Impression Statement Patient presented today with severe hypersensitivity to touch in RUE and was reluctant to lie down for treatment. A tolerable recumbant position was found. She screams and flinches when PT touched even her shirt collar. PT worked with patient on relaxation breathing techniques and estim was applied at normal level for pain. Pt was then able to tolerate ROM to wrist, elbow and shoulder IR/ER and ABD to 40 deg. She was only able to tolerate about 10 deg of flexion however. PT educated patient on scapular retraction and depression in order to decrease hyperactivity of pecs and UTs.    PT Treatment/Interventions ADLs/Self Care Home Management;Electrical Stimulation;Moist Heat;Ultrasound;Patient/family education;Therapeutic exercise;Therapeutic activities;Passive range of motion   PT Next Visit Plan MD order:  Elbow passive flex and extension, passive supine shoulder ROM.  HMP and electrical stimulation.  No CP's.   PT Home Exercise Plan scap retraction and depression   Consulted and Agree with Plan of Care Patient      Patient will benefit from skilled therapeutic intervention in order to improve the following deficits and impairments:  Pain, Decreased activity tolerance, Decreased range of motion  Visit Diagnosis: Chronic right shoulder pain  Stiffness of right shoulder, not elsewhere classified     Problem List Patient Active Problem List   Diagnosis Date Noted  . Traumatic partial tear  of right biceps tendon 06/29/2016  . Spondylosis of cervical region without myelopathy or radiculopathy 06/29/2016  . Systemic lupus ([place]) 09/02/2014  . Hyperlipidemia 09/02/2014  . Hypothyroidism 04/09/2013  . Depression 04/09/2013  . GAD (generalized anxiety disorder) 04/09/2013    Madelyn Flavors PT 09/15/2016, 12:15 PM  [facility] [place], [place], [address] Phone: [phone]   Fax:  [phone]  Name: Kimberly Roth MRN: [id number] Date of Birth: July 30, 1946

## 2016-09-19 ENCOUNTER — Ambulatory Visit: Payer: Worker's Compensation | Admitting: Physical Therapy

## 2016-09-19 ENCOUNTER — Telehealth (INDEPENDENT_AMBULATORY_CARE_PROVIDER_SITE_OTHER): Payer: Self-pay

## 2016-09-19 DIAGNOSIS — M25611 Stiffness of right shoulder, not elsewhere classified: Secondary | ICD-10-CM

## 2016-09-19 DIAGNOSIS — M25511 Pain in right shoulder: Secondary | ICD-10-CM | POA: Diagnosis not present

## 2016-09-19 DIAGNOSIS — G8929 Other chronic pain: Secondary | ICD-10-CM

## 2016-09-19 NOTE — Therapy (Signed)
[facility] [place], [place], [address] Phone: [phone]   Fax:  [phone]  Physical Therapy Treatment  Patient Details  Name: Kimberly Roth MRN: [id number] Date of Birth: 10-23-1945 Referring Provider: Rodell Perna MD.  Encounter Date: 09/19/2016      PT End of Session - 09/19/16 1727    Visit Number 3   Number of Visits 8   Date for PT Re-Evaluation 10/09/16   PT Start Time [id number]   PT Stop Time 0522  Patient could not tolerate a full treatment due to high pain-level.   PT Time Calculation (min) 37 min   Activity Tolerance Patient limited by pain   Behavior During Therapy Anxious      Past Medical History:  Diagnosis Date  . Anxiety   . Breast cancer ([place])    breast bilateral  . Cataract   . History of shingles   . Hyperlipidemia   . PTSD (post-traumatic stress disorder)    related to childhood trauma  . Systemic lupus ([place])   . Uterine cancer [facility])     Past Surgical History:  Procedure Laterality Date  . ABDOMINAL HYSTERECTOMY  1976   no oopherectomy  . APPENDECTOMY    . BACK SURGERY    . BREAST IMPLANT REMOVAL Bilateral    Silicone allergy  . BREAST SURGERY Bilateral 1991   Mastectomy  . CATARACT EXTRACTION W/PHACO Right 10/30/2013   Procedure: CATARACT EXTRACTION PHACO AND INTRAOCULAR LENS PLACEMENT (IOC);  Surgeon: Tonny Branch, MD;  Location: [facility];  Service: Ophthalmology;  Laterality: Right;  CDE:10.13  . CATARACT EXTRACTION W/PHACO Left 11/10/2013   Procedure: CATARACT EXTRACTION PHACO AND INTRAOCULAR LENS PLACEMENT (IOC);  Surgeon: Tonny Branch, MD;  Location: [facility];  Service: Ophthalmology;  Laterality: Left;  CDE 9.71  . DILATION AND CURETTAGE OF UTERUS    . EYE SURGERY Bilateral 2015   Cataract - Dr Geoffry Paradise in [place]  . JOINT REPLACEMENT Left 2005   Knee x 2  . MASTECTOMY Bilateral   . PLACEMENT OF BREAST IMPLANTS Bilateral    Secondary to cancer  . RECONSTRUCTION / CORRECTION OF NIPPLE  / AEROLA Bilateral   . TONSILLECTOMY    . WISDOM TOOTH EXTRACTION Bilateral    Extraction x 4  . WRIST SURGERY Right 20's   patient not exactly sure of reason    There were no vitals filed for this visit.      Subjective Assessment - 09/19/16 1745    Subjective I'm in a lot of pain today.   Pain Onset More than a month ago    Treatment:  UE Ranger seated x 5 minutes with patient only able to perform minimal right UE movement.  In supine on 45 degree incline attempted very gentle RT UE ROM only 4 minutes when patient said:  "I can't take this."  HMP and pre-mod e'stim (low-level) 5 sec on and 5 sec off to patient's right shoulder. We verbally reviewed pendulum exercise and I demonstrated for her to try active assistive right shoulder movement using left hand to grasp right wrist.  I informed her that if we are not able to move her right UE she can develop frozen shoulder.                                  PT Long Term Goals - 09/11/16 1643      PT LONG TERM GOAL #  1   Title Independent with an HEP.   Time 4   Period Weeks   Status New     PT LONG TERM GOAL #2   Title Active right shoulder flexion to 145 degrees so the patient can easily reach overhead.   Time 4   Period Weeks   Status New     PT LONG TERM GOAL #3   Title Active ER to 70 degrees+ to allow for easily donning/doffing of apparel   Time 4   Period Weeks   Status New     PT LONG TERM GOAL #4   Title Full right elbow extension.   Time 4   Period Weeks   Status New     PT LONG TERM GOAL #5   Title Perform ADL's with pain not > 3/10.   Time 4   Period Weeks   Status New             Patient will benefit from skilled therapeutic intervention in order to improve the following deficits and impairments:  Pain, Decreased activity tolerance, Decreased range of motion  Visit Diagnosis: Chronic right shoulder pain  Stiffness of right shoulder, not elsewhere  classified     Problem List Patient Active Problem List   Diagnosis Date Noted  . Traumatic partial tear of right biceps tendon 06/29/2016  . Spondylosis of cervical region without myelopathy or radiculopathy 06/29/2016  . Systemic lupus ([place]) 09/02/2014  . Hyperlipidemia 09/02/2014  . Hypothyroidism 04/09/2013  . Depression 04/09/2013  . GAD (generalized anxiety disorder) 04/09/2013    [place], [place] 09/19/2016, 5:57 PM  [facility] [address] [place], [place], [address] Phone: [phone]   Fax:  [phone]  Name: Kimberly Roth MRN: [id number] Date of Birth: 04-Jul-1946

## 2016-09-19 NOTE — Telephone Encounter (Signed)
Received a voice mail from someone @ [employer] requesting work status note from pts last appointment. Said to fax it to [phone] attn:claim#[id number]

## 2016-09-20 ENCOUNTER — Encounter: Payer: Self-pay | Admitting: Physical Therapy

## 2016-09-20 ENCOUNTER — Ambulatory Visit: Payer: Worker's Compensation | Admitting: Physical Therapy

## 2016-09-20 DIAGNOSIS — M25511 Pain in right shoulder: Secondary | ICD-10-CM | POA: Diagnosis not present

## 2016-09-20 DIAGNOSIS — G8929 Other chronic pain: Secondary | ICD-10-CM

## 2016-09-20 DIAGNOSIS — M25611 Stiffness of right shoulder, not elsewhere classified: Secondary | ICD-10-CM

## 2016-09-20 NOTE — Telephone Encounter (Signed)
Can you advise on patient's out of work status?  How long would you like me to write for her to be out? Thanks.

## 2016-09-20 NOTE — Therapy (Signed)
[facility] [place], [place], [address] Phone: [phone]   Fax:  [phone]  Physical Therapy Treatment  Patient Details  Name: Kimberly Roth MRN: [id number] Date of Birth: [birth year] Referring Provider: Rodell Perna MD.  Encounter Date: 09/20/2016      PT End of Session - 09/20/16 1404    Visit Number 4   Number of Visits 8   Date for PT Re-Evaluation 10/09/16   PT Start Time 1314   PT Stop Time 1414   PT Time Calculation (min) 60 min   Activity Tolerance Patient limited by pain   Behavior During Therapy Anxious      Past Medical History:  Diagnosis Date  . Anxiety   . Breast cancer ([place])    breast bilateral  . Cataract   . History of shingles   . Hyperlipidemia   . PTSD (post-traumatic stress disorder)    related to childhood trauma  . Systemic lupus ([place])   . Uterine cancer [facility])     Past Surgical History:  Procedure Laterality Date  . ABDOMINAL HYSTERECTOMY  1976   no oopherectomy  . APPENDECTOMY    . BACK SURGERY    . BREAST IMPLANT REMOVAL Bilateral    Silicone allergy  . BREAST SURGERY Bilateral 1991   Mastectomy  . CATARACT EXTRACTION W/PHACO Right 10/30/2013   Procedure: CATARACT EXTRACTION PHACO AND INTRAOCULAR LENS PLACEMENT (IOC);  Surgeon: Tonny Branch, MD;  Location: [facility];  Service: Ophthalmology;  Laterality: Right;  CDE:10.13  . CATARACT EXTRACTION W/PHACO Left 11/10/2013   Procedure: CATARACT EXTRACTION PHACO AND INTRAOCULAR LENS PLACEMENT (IOC);  Surgeon: Tonny Branch, MD;  Location: [facility];  Service: Ophthalmology;  Laterality: Left;  CDE 9.71  . DILATION AND CURETTAGE OF UTERUS    . EYE SURGERY Bilateral 2015   Cataract - Dr Geoffry Paradise in [place]  . JOINT REPLACEMENT Left 2005   Knee x 2  . MASTECTOMY Bilateral   . PLACEMENT OF BREAST IMPLANTS Bilateral    Secondary to cancer  . RECONSTRUCTION / CORRECTION OF NIPPLE / AEROLA Bilateral   . TONSILLECTOMY    . WISDOM TOOTH EXTRACTION  Bilateral    Extraction x 4  . WRIST SURGERY Right 20's   patient not exactly sure of reason    There were no vitals filed for this visit.      Subjective Assessment - 09/20/16 1341    Subjective ongoing pain    Patient Stated Goals Regain use of right UE.   Currently in Pain? Yes   Pain Score 10-Worst pain ever   Pain Location Shoulder   Pain Orientation Right   Pain Descriptors / Indicators Aching;Spasm;Sharp   Pain Type Chronic pain   Pain Onset More than a month ago   Pain Frequency Constant   Aggravating Factors  movement   Pain Relieving Factors meds            [facility] PT Assessment - 09/20/16 0001      ROM / Strength   AROM / PROM / Strength PROM     PROM   PROM Assessment Site Shoulder;Elbow   Right/Left Shoulder Right   Right Shoulder Flexion 50 Degrees   Right Shoulder External Rotation 49 Degrees   Right/Left Elbow Right   Right Elbow Extension -41                     [facility] Adult PT Treatment/Exercise - 09/20/16 0001      Moist  Heat Therapy   Number Minutes Moist Heat 15 Minutes   Moist Heat Location Shoulder     Electrical Stimulation   Electrical Stimulation Location R shoulder Premod 80-150 hz x 15 min to tolerance   Electrical Stimulation Goals Pain     Manual Therapy   Manual Therapy Passive ROM   Passive ROM gentle PROM for right shoulder flexion, ER, IR and elbow ext with slow movement                     PT Long Term Goals - 09/20/16 1406      PT LONG TERM GOAL #1   Title Independent with an HEP.   Time 4   Period Weeks   Status On-going     PT LONG TERM GOAL #2   Title Active right shoulder flexion to 145 degrees so the patient can easily reach overhead.   Time 4   Period Weeks   Status On-going     PT LONG TERM GOAL #3   Title Active ER to 70 degrees+ to allow for easily donning/doffing of apparel   Time 4   Period Weeks   Status On-going     PT LONG TERM GOAL #4   Title Full right elbow  extension.   Time 4   Period Weeks   Status On-going     PT LONG TERM GOAL #5   Title Perform ADL's with pain not > 3/10.   Time 4   Period Weeks   Status On-going               Plan - 09/20/16 1406    Clinical Impression Statement Patient tolerated treatment fairly well yet with a lot of pain, increased guarding and increased muscle spasms in right shoulder. Patient was able to improve PROM with very slow movements and cues for relaxing. Patient had mutiple moments of visual muscle spasms which required ossilations to relax. Patient unable to meet any further goals due to pain and ROM deficts.   Rehab Potential Good   PT Frequency 2x / week   PT Duration 4 weeks   PT Treatment/Interventions ADLs/Self Care Home Management;Electrical Stimulation;Moist Heat;Ultrasound;Patient/family education;Therapeutic exercise;Therapeutic activities;Passive range of motion   PT Next Visit Plan MD order:  Elbow passive flex and extension, passive supine shoulder ROM.  HMP and electrical stimulation.  No CP's. (sending MD note to Dr. Lorin Mercy today)   Consulted and Agree with Plan of Care Patient      Patient will benefit from skilled therapeutic intervention in order to improve the following deficits and impairments:  Pain, Decreased activity tolerance, Decreased range of motion  Visit Diagnosis: Stiffness of right shoulder, not elsewhere classified  Chronic right shoulder pain     Problem List Patient Active Problem List   Diagnosis Date Noted  . Traumatic partial tear of right biceps tendon 06/29/2016  . Spondylosis of cervical region without myelopathy or radiculopathy 06/29/2016  . Systemic lupus ([place]) 09/02/2014  . Hyperlipidemia 09/02/2014  . Hypothyroidism 04/09/2013  . Depression 04/09/2013  . GAD (generalized anxiety disorder) 04/09/2013    Ladean Raya, PTA 09/20/16 5:49 PM Mali Applegate MPT [facility] [address] [place], [place], [address] Phone: [phone]   Fax:  [phone]  Name: ORPHA OKABE MRN: [id number] Date of Birth: 02-27-1946

## 2016-09-20 NOTE — Telephone Encounter (Signed)
Write her out for 5 wks thanks. Delay on getting therapy started. Etc. thanks

## 2016-09-20 NOTE — Therapy (Signed)
[facility] [place], [place], [address] Phone: [phone]   Fax:  [phone]  Physical Therapy Treatment  Patient Details  Name: BRITTIAN SAWIN MRN: [id number] Date of Birth: 1946-04-02 Referring Provider: Rodell Perna MD.  Encounter Date: 09/19/2016      PT End of Session - 09/19/16 1727    Visit Number 3   Number of Visits 8   Date for PT Re-Evaluation 10/09/16   PT Start Time [id number]   PT Stop Time 0522  Patient could not tolerate a full treatment due to high pain-level.   PT Time Calculation (min) 37 min   Activity Tolerance Patient limited by pain   Behavior During Therapy Anxious      Past Medical History:  Diagnosis Date  . Anxiety   . Breast cancer ([place])    breast bilateral  . Cataract   . History of shingles   . Hyperlipidemia   . PTSD (post-traumatic stress disorder)    related to childhood trauma  . Systemic lupus ([place])   . Uterine cancer [facility])     Past Surgical History:  Procedure Laterality Date  . ABDOMINAL HYSTERECTOMY  1976   no oopherectomy  . APPENDECTOMY    . BACK SURGERY    . BREAST IMPLANT REMOVAL Bilateral    Silicone allergy  . BREAST SURGERY Bilateral 1991   Mastectomy  . CATARACT EXTRACTION W/PHACO Right 10/30/2013   Procedure: CATARACT EXTRACTION PHACO AND INTRAOCULAR LENS PLACEMENT (IOC);  Surgeon: Tonny Branch, MD;  Location: [facility];  Service: Ophthalmology;  Laterality: Right;  CDE:10.13  . CATARACT EXTRACTION W/PHACO Left 11/10/2013   Procedure: CATARACT EXTRACTION PHACO AND INTRAOCULAR LENS PLACEMENT (IOC);  Surgeon: Tonny Branch, MD;  Location: [facility];  Service: Ophthalmology;  Laterality: Left;  CDE 9.71  . DILATION AND CURETTAGE OF UTERUS    . EYE SURGERY Bilateral 2015   Cataract - Dr Geoffry Paradise in [place]  . JOINT REPLACEMENT Left 2005   Knee x 2  . MASTECTOMY Bilateral   . PLACEMENT OF BREAST IMPLANTS Bilateral    Secondary to cancer  . RECONSTRUCTION / CORRECTION OF NIPPLE  / AEROLA Bilateral   . TONSILLECTOMY    . WISDOM TOOTH EXTRACTION Bilateral    Extraction x 4  . WRIST SURGERY Right 20's   patient not exactly sure of reason    There were no vitals filed for this visit.      Subjective Assessment - 09/19/16 1745    Subjective I'm in a lot of pain today.   Pain Onset More than a month ago                                      PT Long Term Goals - 09/11/16 1643      PT LONG TERM GOAL #1   Title Independent with an HEP.   Time 4   Period Weeks   Status New     PT LONG TERM GOAL #2   Title Active right shoulder flexion to 145 degrees so the patient can easily reach overhead.   Time 4   Period Weeks   Status New     PT LONG TERM GOAL #3   Title Active ER to 70 degrees+ to allow for easily donning/doffing of apparel   Time 4   Period Weeks   Status New     PT LONG TERM GOAL #  4   Title Full right elbow extension.   Time 4   Period Weeks   Status New     PT LONG TERM GOAL #5   Title Perform ADL's with pain not > 3/10.   Time 4   Period Weeks   Status New             Patient will benefit from skilled therapeutic intervention in order to improve the following deficits and impairments:  Pain, Decreased activity tolerance, Decreased range of motion  Visit Diagnosis: Chronic right shoulder pain  Stiffness of right shoulder, not elsewhere classified     Problem List Patient Active Problem List   Diagnosis Date Noted  . Traumatic partial tear of right biceps tendon 06/29/2016  . Spondylosis of cervical region without myelopathy or radiculopathy 06/29/2016  . Systemic lupus ([place]) 09/02/2014  . Hyperlipidemia 09/02/2014  . Hypothyroidism 04/09/2013  . Depression 04/09/2013  . GAD (generalized anxiety disorder) 04/09/2013    Sabrinia Prien, [place] 09/20/2016, 10:59 AM  [facility] [place], [place], [address] Phone: [phone]   Fax:   [phone]  Name: RAEDYN LANZAS MRN: [id number] Date of Birth: 03-21-1946

## 2016-09-21 ENCOUNTER — Encounter (INDEPENDENT_AMBULATORY_CARE_PROVIDER_SITE_OTHER): Payer: Self-pay | Admitting: Orthopaedic Surgery

## 2016-09-21 ENCOUNTER — Ambulatory Visit (INDEPENDENT_AMBULATORY_CARE_PROVIDER_SITE_OTHER): Payer: Worker's Compensation | Admitting: Orthopaedic Surgery

## 2016-09-21 VITALS — BP 146/84 | HR 77 | Ht 64.0 in | Wt 128.0 lb

## 2016-09-21 DIAGNOSIS — S46211D Strain of muscle, fascia and tendon of other parts of biceps, right arm, subsequent encounter: Secondary | ICD-10-CM

## 2016-09-21 NOTE — Progress Notes (Signed)
Post-Op Visit Note   Patient: Kimberly Roth           Date of Birth: [birth year]           MRN: [id number] Visit Date: 09/21/2016 PCP: [name], FNP   Assessment & Plan: Patient is a physical therapy. She lacks 30 reaching full elbow extension. She still holds her arm across her chest most of the time she's been using her sling I've encouraged her to wean herself out of the sling. I gave her additional exercises to work on elbow extension at home. Arthroscopic portals and biceps tenodesis incision is nicely healed. Considerable delay between original diagnosis and surgical correction. Work slip given no work 5 weeks done a few days ago. I will recheck her again in 4 weeks. She has gotten considerable relief with TENS unit during therapy and prescription was written for home TENS unit that she can use to help when she does her exercises to regain motion.  Chief Complaint:  Chief Complaint  Patient presents with  . Right Shoulder - Routine Post Op  Postop from right shoulder biceps tenodesis for partial biceps tendon tear Visit Diagnoses: Postop right shoulder biceps tenodesis 08/07/2014 for partial biceps tendon tear, intra-articular.  Plan: Return 4 weeks. Continue therapy and home exercises. Prescription for home TENS unit. She is using Ultram for pain. She been taking some for many months prior to her surgical procedure. She is off the Percocet that she had an immediate postoperative time period.  Follow-Up Instructions: Return in about 4 weeks (around 10/19/2016).   Orders:  No orders of the defined types were placed in this encounter.  No orders of the defined types were placed in this encounter.  HPI Patient returns for follow up. She is status post right rotator cuff repair on 08/07/2016. She states that physical therapy is going very slow and is very painful. She is using a TENS unit at home and in therapy. She requests a script for the TENS unit. She is taking Ultram  and Voltaren, but the Voltaren is causing nausea and vomiting.    Imaging: No results found.  PMFS History: Patient Active Problem List   Diagnosis Date Noted  . Traumatic partial tear of right biceps tendon 06/29/2016  . Spondylosis of cervical region without myelopathy or radiculopathy 06/29/2016  . Systemic lupus ([place]) 09/02/2014  . Hyperlipidemia 09/02/2014  . Hypothyroidism 04/09/2013  . Depression 04/09/2013  . GAD (generalized anxiety disorder) 04/09/2013   Past Medical History:  Diagnosis Date  . Anxiety   . Breast cancer ([place])    breast bilateral  . Cataract   . History of shingles   . Hyperlipidemia   . PTSD (post-traumatic stress disorder)    related to childhood trauma  . Systemic lupus ([place])   . Uterine cancer ([place])     Family History  Problem Relation Age of Onset  . Heart failure Father   . Heart disease Father   . Heart attack Father 41  . Mental illness Mother   . Mental illness Sister     PTSD  . Osteoporosis Paternal Grandmother     Past Surgical History:  Procedure Laterality Date  . ABDOMINAL HYSTERECTOMY  1976   no oopherectomy  . APPENDECTOMY    . BACK SURGERY    . BREAST IMPLANT REMOVAL Bilateral    Silicone allergy  . BREAST SURGERY Bilateral 1991   Mastectomy  . CATARACT EXTRACTION W/PHACO Right 10/30/2013   Procedure: CATARACT EXTRACTION PHACO  AND INTRAOCULAR LENS PLACEMENT (IOC);  Surgeon: Tonny Branch, MD;  Location: [facility];  Service: Ophthalmology;  Laterality: Right;  CDE:10.13  . CATARACT EXTRACTION W/PHACO Left 11/10/2013   Procedure: CATARACT EXTRACTION PHACO AND INTRAOCULAR LENS PLACEMENT (IOC);  Surgeon: Tonny Branch, MD;  Location: [facility];  Service: Ophthalmology;  Laterality: Left;  CDE 9.71  . DILATION AND CURETTAGE OF UTERUS    . EYE SURGERY Bilateral 2015   Cataract - Dr Geoffry Paradise in [place]  . JOINT REPLACEMENT Left 2005   Knee x 2  . MASTECTOMY Bilateral   . PLACEMENT OF BREAST IMPLANTS Bilateral    Secondary to cancer  .  RECONSTRUCTION / CORRECTION OF NIPPLE / AEROLA Bilateral   . TONSILLECTOMY    . WISDOM TOOTH EXTRACTION Bilateral    Extraction x 4  . WRIST SURGERY Right 20's   patient not exactly sure of reason   Social History   Occupational History  . Not on file.   Social History Main Topics  . Smoking status: Never Smoker  . Smokeless tobacco: Never Used  . Alcohol use 0.6 oz/week    1 Glasses of wine per week     Comment: 1 or 2 times per year  . Drug use: No  . Sexual activity: Yes

## 2016-09-22 ENCOUNTER — Telehealth (INDEPENDENT_AMBULATORY_CARE_PROVIDER_SITE_OTHER): Payer: Self-pay | Admitting: Orthopaedic Surgery

## 2016-09-22 NOTE — Telephone Encounter (Signed)
Needing the scripts faxed over for the patient fax # [phone]

## 2016-09-27 ENCOUNTER — Telehealth (INDEPENDENT_AMBULATORY_CARE_PROVIDER_SITE_OTHER): Payer: Self-pay

## 2016-09-27 ENCOUNTER — Encounter: Payer: Self-pay | Admitting: Physical Therapy

## 2016-09-27 ENCOUNTER — Ambulatory Visit: Payer: Worker's Compensation | Admitting: Physical Therapy

## 2016-09-27 DIAGNOSIS — M25511 Pain in right shoulder: Secondary | ICD-10-CM | POA: Diagnosis not present

## 2016-09-27 DIAGNOSIS — G8929 Other chronic pain: Secondary | ICD-10-CM

## 2016-09-27 DIAGNOSIS — M25611 Stiffness of right shoulder, not elsewhere classified: Secondary | ICD-10-CM

## 2016-09-27 NOTE — Telephone Encounter (Signed)
faxed

## 2016-09-27 NOTE — Telephone Encounter (Signed)
Note written and faxed 

## 2016-09-27 NOTE — Telephone Encounter (Signed)
Received a fax requesting the 09/21/16 office and work note and TENS unit rx be faxed. Faxed all to [phone].

## 2016-09-27 NOTE — Therapy (Signed)
[facility] [place], [place], [address] Phone: [phone]   Fax:  [phone]  Physical Therapy Treatment  Patient Details  Name: Kimberly Roth MRN: [id number] Date of Birth: 04-23-1946 Referring Provider: Rodell Perna MD.  Encounter Date: 09/27/2016      PT End of Session - 09/27/16 1313    Visit Number 5   Number of Visits 8   Date for PT Re-Evaluation 10/09/16   PT Start Time 1230   PT Stop Time 1314   PT Time Calculation (min) 44 min   Activity Tolerance Patient limited by pain;Patient tolerated treatment well   Behavior During Therapy [facility] for tasks assessed/performed      Past Medical History:  Diagnosis Date  . Anxiety   . Breast cancer ([place])    breast bilateral  . Cataract   . History of shingles   . Hyperlipidemia   . PTSD (post-traumatic stress disorder)    related to childhood trauma  . Systemic lupus ([place])   . Uterine cancer [facility])     Past Surgical History:  Procedure Laterality Date  . ABDOMINAL HYSTERECTOMY  1976   no oopherectomy  . APPENDECTOMY    . BACK SURGERY    . BREAST IMPLANT REMOVAL Bilateral    Silicone allergy  . BREAST SURGERY Bilateral 1991   Mastectomy  . CATARACT EXTRACTION W/PHACO Right 10/30/2013   Procedure: CATARACT EXTRACTION PHACO AND INTRAOCULAR LENS PLACEMENT (IOC);  Surgeon: Tonny Branch, MD;  Location: [facility];  Service: Ophthalmology;  Laterality: Right;  CDE:10.13  . CATARACT EXTRACTION W/PHACO Left 11/10/2013   Procedure: CATARACT EXTRACTION PHACO AND INTRAOCULAR LENS PLACEMENT (IOC);  Surgeon: Tonny Branch, MD;  Location: [facility];  Service: Ophthalmology;  Laterality: Left;  CDE 9.71  . DILATION AND CURETTAGE OF UTERUS    . EYE SURGERY Bilateral 2015   Cataract - Dr Geoffry Paradise in [place]  . JOINT REPLACEMENT Left 2005   Knee x 2  . MASTECTOMY Bilateral   . PLACEMENT OF BREAST IMPLANTS Bilateral    Secondary to cancer  . RECONSTRUCTION / CORRECTION OF NIPPLE / AEROLA  Bilateral   . TONSILLECTOMY    . WISDOM TOOTH EXTRACTION Bilateral    Extraction x 4  . WRIST SURGERY Right 20's   patient not exactly sure of reason    There were no vitals filed for this visit.      Subjective Assessment - 09/27/16 1241    Subjective Patient reported a lot of pain and difficulty with movement, Patient has TENS unit   Patient Stated Goals Regain use of right UE.   Pain Score 10-Worst pain ever   Pain Location Shoulder   Pain Orientation Right   Pain Descriptors / Indicators Aching;Sore;Sharp;Spasm   Pain Onset More than a month ago   Pain Frequency Constant   Aggravating Factors  movement   Pain Relieving Factors meds and rest                         [facility] Adult PT Treatment/Exercise - 09/27/16 0001      Moist Heat Therapy   Number Minutes Moist Heat --   Moist Heat Location Shoulder     Manual Therapy   Manual Therapy Passive ROM   Passive ROM gentle PROM for right shoulder flexion, ER, IR and elbow ext with slow movement  PT Long Term Goals - 09/20/16 1406      PT LONG TERM GOAL #1   Title Independent with an HEP.   Time 4   Period Weeks   Status On-going     PT LONG TERM GOAL #2   Title Active right shoulder flexion to 145 degrees so the patient can easily reach overhead.   Time 4   Period Weeks   Status On-going     PT LONG TERM GOAL #3   Title Active ER to 70 degrees+ to allow for easily donning/doffing of apparel   Time 4   Period Weeks   Status On-going     PT LONG TERM GOAL #4   Title Full right elbow extension.   Time 4   Period Weeks   Status On-going     PT LONG TERM GOAL #5   Title Perform ADL's with pain not > 3/10.   Time 4   Period Weeks   Status On-going               Plan - 09/27/16 1315    Clinical Impression Statement Patient tolerated treatment well today and was able to tolerated all supine PROM with improved tolerance and less muscle spasm and  guarding. Today focused on supine manual PROM per MD instruction with self TENS unit and heat durning treatment for comfort. Patient improved ROM today. Current goals ongoing due to MD orders/healing   Rehab Potential Good   Clinical Impairments Affecting Rehab Potential surgery 08/07/16 7 weeks current 09/25/26 ( 09/11/16 eval 2 weeks post eval 09/25/16)   PT Frequency 2x / week   PT Duration 4 weeks   PT Treatment/Interventions ADLs/Self Care Home Management;Electrical Stimulation;Moist Heat;Ultrasound;Patient/family education;Therapeutic exercise;Therapeutic activities;Passive range of motion   PT Next Visit Plan cont per MD order:  Elbow passive flex and extension, passive supine shoulder ROM.  HMP and electrical stimulation.  No CP's (awaiting new order from MD. Jill Poling and Agree with Plan of Care Patient      Patient will benefit from skilled therapeutic intervention in order to improve the following deficits and impairments:  Pain, Decreased activity tolerance, Decreased range of motion  Visit Diagnosis: Stiffness of right shoulder, not elsewhere classified  Chronic right shoulder pain     Problem List Patient Active Problem List   Diagnosis Date Noted  . Traumatic partial tear of right biceps tendon 06/29/2016  . Spondylosis of cervical region without myelopathy or radiculopathy 06/29/2016  . Systemic lupus ([place]) 09/02/2014  . Hyperlipidemia 09/02/2014  . Hypothyroidism 04/09/2013  . Depression 04/09/2013  . GAD (generalized anxiety disorder) 04/09/2013    Edsel Shives P, PTA 09/27/2016, 1:31 PM  [facility] [place], [place], [address] Phone: [phone]   Fax:  [phone]  Name: Kimberly Roth MRN: [id number] Date of Birth: Jun 15, 1946

## 2016-09-28 ENCOUNTER — Encounter: Payer: Self-pay | Admitting: Physical Therapy

## 2016-09-28 ENCOUNTER — Ambulatory Visit: Payer: Worker's Compensation | Attending: Orthopaedic Surgery | Admitting: Physical Therapy

## 2016-09-28 DIAGNOSIS — G8929 Other chronic pain: Secondary | ICD-10-CM | POA: Insufficient documentation

## 2016-09-28 DIAGNOSIS — M25511 Pain in right shoulder: Secondary | ICD-10-CM | POA: Diagnosis present

## 2016-09-28 DIAGNOSIS — M25611 Stiffness of right shoulder, not elsewhere classified: Secondary | ICD-10-CM | POA: Insufficient documentation

## 2016-09-28 NOTE — Therapy (Signed)
[facility] [place], [place], [address] Phone: [phone]   Fax:  [phone]  Physical Therapy Treatment  Patient Details  Name: Kimberly Roth MRN: [id number] Date of Birth: 1946/05/30 Referring Provider: Rodell Perna MD.  Encounter Date: 09/28/2016      PT End of Session - 09/28/16 1207    Visit Number 6   Number of Visits 8   Date for PT Re-Evaluation 10/09/16   PT Start Time 1115   PT Stop Time 1201   PT Time Calculation (min) 46 min   Activity Tolerance Patient limited by pain;Patient tolerated treatment well   Behavior During Therapy [facility] for tasks assessed/performed      Past Medical History:  Diagnosis Date  . Anxiety   . Breast cancer ([place])    breast bilateral  . Cataract   . History of shingles   . Hyperlipidemia   . PTSD (post-traumatic stress disorder)    related to childhood trauma  . Systemic lupus ([place])   . Uterine cancer [facility])     Past Surgical History:  Procedure Laterality Date  . ABDOMINAL HYSTERECTOMY  1976   no oopherectomy  . APPENDECTOMY    . BACK SURGERY    . BREAST IMPLANT REMOVAL Bilateral    Silicone allergy  . BREAST SURGERY Bilateral 1991   Mastectomy  . CATARACT EXTRACTION W/PHACO Right 10/30/2013   Procedure: CATARACT EXTRACTION PHACO AND INTRAOCULAR LENS PLACEMENT (IOC);  Surgeon: Tonny Branch, MD;  Location: [facility];  Service: Ophthalmology;  Laterality: Right;  CDE:10.13  . CATARACT EXTRACTION W/PHACO Left 11/10/2013   Procedure: CATARACT EXTRACTION PHACO AND INTRAOCULAR LENS PLACEMENT (IOC);  Surgeon: Tonny Branch, MD;  Location: [facility];  Service: Ophthalmology;  Laterality: Left;  CDE 9.71  . DILATION AND CURETTAGE OF UTERUS    . EYE SURGERY Bilateral 2015   Cataract - Dr Geoffry Paradise in [place]  . JOINT REPLACEMENT Left 2005   Knee x 2  . MASTECTOMY Bilateral   . PLACEMENT OF BREAST IMPLANTS Bilateral    Secondary to cancer  . RECONSTRUCTION / CORRECTION OF NIPPLE / AEROLA  Bilateral   . TONSILLECTOMY    . WISDOM TOOTH EXTRACTION Bilateral    Extraction x 4  . WRIST SURGERY Right 20's   patient not exactly sure of reason    There were no vitals filed for this visit.      Subjective Assessment - 09/28/16 1132    Subjective patient reported to PT that her Dr. Hilaria Ota was painful when he" yanked on arm without warning"   Patient Stated Goals Regain use of right UE.   Currently in Pain? Yes   Pain Score 7    Pain Location Shoulder   Pain Orientation Right   Pain Descriptors / Indicators Aching;Spasm;Shooting;Sharp   Pain Type Surgical pain   Pain Onset More than a month ago   Pain Frequency Constant   Aggravating Factors  movement   Pain Relieving Factors rest            [facility] PT Assessment - 09/28/16 0001      PROM   Right/Left Shoulder Right   Right Shoulder External Rotation 65 Degrees   Right/Left Elbow Right   Right Elbow Extension -5                     [facility] Adult PT Treatment/Exercise - 09/28/16 0001      Moist Heat Therapy   Moist Heat Location Shoulder  during treatment     Acupuncturist Location R shoulder Premod 80-150 hz    Electrical Stimulation Goals Pain  during treatment     Manual Therapy   Manual Therapy Passive ROM   Passive ROM gentle PROM for right shoulder flexion, ER, IR and elbow ext with slow movement                     PT Long Term Goals - 09/20/16 1406      PT LONG TERM GOAL #1   Title Independent with an HEP.   Time 4   Period Weeks   Status On-going     PT LONG TERM GOAL #2   Title Active right shoulder flexion to 145 degrees so the patient can easily reach overhead.   Time 4   Period Weeks   Status On-going     PT LONG TERM GOAL #3   Title Active ER to 70 degrees+ to allow for easily donning/doffing of apparel   Time 4   Period Weeks   Status On-going     PT LONG TERM GOAL #4   Title Full right elbow extension.   Time 4    Period Weeks   Status On-going     PT LONG TERM GOAL #5   Title Perform ADL's with pain not > 3/10.   Time 4   Period Weeks   Status On-going               Plan - 09/28/16 1208    Clinical Impression Statement Patient tolerated treatment fairly well with some ongoing muscle spasms and guarding. Patient continues to improve ROM in right shoulder flexion and ER and elbow ext. Patient required [place] and ES throughout treatment for comfort. Goals ongoing due to protocol limitations.   Rehab Potential Good   Clinical Impairments Affecting Rehab Potential surgery 08/07/16 - 8 weeks current 10/02/16 ( 09/11/16 eval 3 weeks post eval 10/02/16)   PT Frequency 2x / week   PT Duration 4 weeks   PT Treatment/Interventions ADLs/Self Care Home Management;Electrical Stimulation;Moist Heat;Ultrasound;Patient/family education;Therapeutic exercise;Therapeutic activities;Passive range of motion   PT Next Visit Plan cont per MD order:  Elbow passive flex and extension, passive supine shoulder ROM.  HMP and electrical stimulation.  No CP's (awaiting new order from MD. Lorin Mercy sent follow up to MPT)   Consulted and Agree with Plan of Care Patient      Patient will benefit from skilled therapeutic intervention in order to improve the following deficits and impairments:  Pain, Decreased activity tolerance, Decreased range of motion  Visit Diagnosis: Stiffness of right shoulder, not elsewhere classified  Chronic right shoulder pain     Problem List Patient Active Problem List   Diagnosis Date Noted  . Traumatic partial tear of right biceps tendon 06/29/2016  . Spondylosis of cervical region without myelopathy or radiculopathy 06/29/2016  . Systemic lupus ([place]) 09/02/2014  . Hyperlipidemia 09/02/2014  . Hypothyroidism 04/09/2013  . Depression 04/09/2013  . GAD (generalized anxiety disorder) 04/09/2013    Ladean Raya, PTA 09/28/16 12:15 PM  [facility] [place], [place], [address] Phone: [phone]   Fax:  [phone]  Name: Kimberly Roth MRN: [id number] Date of Birth: 07/04/1946

## 2016-10-04 ENCOUNTER — Encounter: Payer: Self-pay | Admitting: Physical Therapy

## 2016-10-04 ENCOUNTER — Ambulatory Visit: Payer: Worker's Compensation | Admitting: Physical Therapy

## 2016-10-04 DIAGNOSIS — M25611 Stiffness of right shoulder, not elsewhere classified: Secondary | ICD-10-CM

## 2016-10-04 DIAGNOSIS — M25511 Pain in right shoulder: Secondary | ICD-10-CM

## 2016-10-04 DIAGNOSIS — G8929 Other chronic pain: Secondary | ICD-10-CM

## 2016-10-04 NOTE — Therapy (Signed)
[facility] [place], [place], [address] Phone: [phone]   Fax:  [phone]  Physical Therapy Treatment  Patient Details  Name: Kimberly Roth MRN: [id number] Date of Birth: [birth year] Referring Provider: Rodell Perna MD.  Encounter Date: 10/04/2016      PT End of Session - 10/04/16 1524    Visit Number 7   Number of Visits 16   Date for PT Re-Evaluation 11/06/16  per new order   PT Start Time 0109   PT Stop Time 3235   PT Time Calculation (min) 43 min   Activity Tolerance Patient limited by pain;Patient tolerated treatment well   Behavior During Therapy [facility] for tasks assessed/performed      Past Medical History:  Diagnosis Date  . Anxiety   . Breast cancer ([place])    breast bilateral  . Cataract   . History of shingles   . Hyperlipidemia   . PTSD (post-traumatic stress disorder)    related to childhood trauma  . Systemic lupus ([place])   . Uterine cancer [facility])     Past Surgical History:  Procedure Laterality Date  . ABDOMINAL HYSTERECTOMY  1976   no oopherectomy  . APPENDECTOMY    . BACK SURGERY    . BREAST IMPLANT REMOVAL Bilateral    Silicone allergy  . BREAST SURGERY Bilateral 1991   Mastectomy  . CATARACT EXTRACTION W/PHACO Right 10/30/2013   Procedure: CATARACT EXTRACTION PHACO AND INTRAOCULAR LENS PLACEMENT (IOC);  Surgeon: Tonny Branch, MD;  Location: [facility];  Service: Ophthalmology;  Laterality: Right;  CDE:10.13  . CATARACT EXTRACTION W/PHACO Left 11/10/2013   Procedure: CATARACT EXTRACTION PHACO AND INTRAOCULAR LENS PLACEMENT (IOC);  Surgeon: Tonny Branch, MD;  Location: [facility];  Service: Ophthalmology;  Laterality: Left;  CDE 9.71  . DILATION AND CURETTAGE OF UTERUS    . EYE SURGERY Bilateral 2015   Cataract - Dr Geoffry Paradise in [place]  . JOINT REPLACEMENT Left 2005   Knee x 2  . MASTECTOMY Bilateral   . PLACEMENT OF BREAST IMPLANTS Bilateral    Secondary to cancer  . RECONSTRUCTION / CORRECTION OF  NIPPLE / AEROLA Bilateral   . TONSILLECTOMY    . WISDOM TOOTH EXTRACTION Bilateral    Extraction x 4  . WRIST SURGERY Right 20's   patient not exactly sure of reason    There were no vitals filed for this visit.      Subjective Assessment - 10/04/16 1452    Subjective Patient reported doing good after last treatment ongoing soreness today   Patient Stated Goals Regain use of right UE.   Currently in Pain? Yes   Pain Score 7    Pain Location Shoulder   Pain Orientation Right   Pain Descriptors / Indicators Aching;Spasm   Pain Type Surgical pain   Pain Onset More than a month ago   Pain Frequency Constant   Aggravating Factors  movement   Pain Relieving Factors rest            [facility] PT Assessment - 10/04/16 0001      PROM   PROM Assessment Site Shoulder;Elbow   Right/Left Shoulder Right   Right Shoulder External Rotation 65 Degrees   Right/Left Elbow Right   Right Elbow Extension -6                     [facility] Adult PT Treatment/Exercise - 10/04/16 0001      Moist Heat Therapy   Moist Heat  Location Shoulder  during treatment (small )     Electrical Stimulation   Electrical Stimulation Location R shoulder Premod 80-150 hz    Electrical Stimulation Goals Pain  during treatment     Manual Therapy   Manual Therapy Passive ROM   Passive ROM gentle PROM for right shoulder flexion, ER, IR and elbow ext with slow movement                     PT Long Term Goals - 09/20/16 1406      PT LONG TERM GOAL #1   Title Independent with an HEP.   Time 4   Period Weeks   Status On-going     PT LONG TERM GOAL #2   Title Active right shoulder flexion to 145 degrees so the patient can easily reach overhead.   Time 4   Period Weeks   Status On-going     PT LONG TERM GOAL #3   Title Active ER to 70 degrees+ to allow for easily donning/doffing of apparel   Time 4   Period Weeks   Status On-going     PT LONG TERM GOAL #4   Title Full right  elbow extension.   Time 4   Period Weeks   Status On-going     PT LONG TERM GOAL #5   Title Perform ADL's with pain not > 3/10.   Time 4   Period Weeks   Status On-going               Plan - 10/04/16 1525    Clinical Impression Statement Patient tolerated treatment fairly well with ongoing muscle spasms and guarding. Patient has good PROM for shoulder flexion and ER/IR and elbow ext yet range ongoing. Patient required [place] and ES throughout treatment for pain contol. Patient current goals ongoing due to MD orders.    Rehab Potential Good   Clinical Impairments Affecting Rehab Potential surgery 08/07/16 - 8 weeks current 10/02/16 ( 09/11/16 eval 3 weeks post eval 10/02/16)   PT Frequency 2x / week   PT Duration 4 weeks   PT Next Visit Plan cont per MD order:  Elbow passive flex and extension, passive supine shoulder ROM.  HMP and electrical stimulation.  No CP's (awaiting new order from MD. Lorin Mercy sent follow up to MPT)   Consulted and Agree with Plan of Care Patient      Patient will benefit from skilled therapeutic intervention in order to improve the following deficits and impairments:  Pain, Decreased activity tolerance, Decreased range of motion  Visit Diagnosis: Stiffness of right shoulder, not elsewhere classified  Chronic right shoulder pain     Problem List Patient Active Problem List   Diagnosis Date Noted  . Traumatic partial tear of right biceps tendon 06/29/2016  . Spondylosis of cervical region without myelopathy or radiculopathy 06/29/2016  . Systemic lupus ([place]) 09/02/2014  . Hyperlipidemia 09/02/2014  . Hypothyroidism 04/09/2013  . Depression 04/09/2013  . GAD (generalized anxiety disorder) 04/09/2013    Phillips Climes , PTA 10/04/2016, 3:29 PM  [facility] [place], [place], [address] Phone: [phone]   Fax:  [phone]  Name: Kimberly Roth MRN: [id number] Date of Birth:  October 17, 1945

## 2016-10-05 ENCOUNTER — Encounter: Payer: Self-pay | Admitting: Physical Therapy

## 2016-10-09 ENCOUNTER — Ambulatory Visit: Payer: Worker's Compensation | Admitting: Physical Therapy

## 2016-10-12 ENCOUNTER — Other Ambulatory Visit: Payer: Self-pay | Admitting: Nurse Practitioner

## 2016-10-12 ENCOUNTER — Ambulatory Visit: Payer: Worker's Compensation | Admitting: Physical Therapy

## 2016-10-12 ENCOUNTER — Encounter: Payer: Self-pay | Admitting: Physical Therapy

## 2016-10-12 DIAGNOSIS — M25511 Pain in right shoulder: Secondary | ICD-10-CM

## 2016-10-12 DIAGNOSIS — G8929 Other chronic pain: Secondary | ICD-10-CM

## 2016-10-12 DIAGNOSIS — M25611 Stiffness of right shoulder, not elsewhere classified: Secondary | ICD-10-CM

## 2016-10-12 DIAGNOSIS — E034 Atrophy of thyroid (acquired): Secondary | ICD-10-CM

## 2016-10-12 NOTE — Therapy (Signed)
[facility] [place], [place], [address] Phone: [phone]   Fax:  [phone]  Physical Therapy Treatment  Patient Details  Name: Kimberly Roth MRN: [id number] Date of Birth: 12/08/1945 Referring Provider: Rodell Perna MD.  Encounter Date: 10/12/2016      PT End of Session - 10/12/16 1311    Visit Number 8   Number of Visits 16   Date for PT Re-Evaluation 11/06/16   PT Start Time 1229   PT Stop Time 6803   PT Time Calculation (min) 46 min   Activity Tolerance Patient limited by pain;Patient tolerated treatment well   Behavior During Therapy [facility] for tasks assessed/performed      Past Medical History:  Diagnosis Date  . Anxiety   . Breast cancer ([place])    breast bilateral  . Cataract   . History of shingles   . Hyperlipidemia   . PTSD (post-traumatic stress disorder)    related to childhood trauma  . Systemic lupus ([place])   . Uterine cancer [facility])     Past Surgical History:  Procedure Laterality Date  . ABDOMINAL HYSTERECTOMY  1976   no oopherectomy  . APPENDECTOMY    . BACK SURGERY    . BREAST IMPLANT REMOVAL Bilateral    Silicone allergy  . BREAST SURGERY Bilateral 1991   Mastectomy  . CATARACT EXTRACTION W/PHACO Right 10/30/2013   Procedure: CATARACT EXTRACTION PHACO AND INTRAOCULAR LENS PLACEMENT (IOC);  Surgeon: Tonny Branch, MD;  Location: [facility];  Service: Ophthalmology;  Laterality: Right;  CDE:10.13  . CATARACT EXTRACTION W/PHACO Left 11/10/2013   Procedure: CATARACT EXTRACTION PHACO AND INTRAOCULAR LENS PLACEMENT (IOC);  Surgeon: Tonny Branch, MD;  Location: [facility];  Service: Ophthalmology;  Laterality: Left;  CDE 9.71  . DILATION AND CURETTAGE OF UTERUS    . EYE SURGERY Bilateral 2015   Cataract - Dr Geoffry Paradise in [place]  . JOINT REPLACEMENT Left 2005   Knee x 2  . MASTECTOMY Bilateral   . PLACEMENT OF BREAST IMPLANTS Bilateral    Secondary to cancer  . RECONSTRUCTION / CORRECTION OF NIPPLE / AEROLA  Bilateral   . TONSILLECTOMY    . WISDOM TOOTH EXTRACTION Bilateral    Extraction x 4  . WRIST SURGERY Right 20's   patient not exactly sure of reason    There were no vitals filed for this visit.      Subjective Assessment - 10/12/16 1243    Subjective Patient had no complaints after last treatment and ongoing pain, not taking medical prescribed pain meds currently   Patient Stated Goals Regain use of right UE.   Currently in Pain? Yes   Pain Score 8    Pain Location Shoulder   Pain Orientation Right   Pain Descriptors / Indicators Aching;Sharp   Pain Type Surgical pain   Pain Onset More than a month ago   Pain Frequency Constant   Aggravating Factors  movement   Pain Relieving Factors at rest            [facility] PT Assessment - 10/12/16 0001      PROM   PROM Assessment Site Shoulder;Elbow   Right/Left Shoulder Right   Right Shoulder Flexion 90 Degrees   Right Shoulder External Rotation 66 Degrees   Right/Left Elbow Right   Right Elbow Extension -4                     [facility] Adult PT Treatment/Exercise - 10/12/16 0001  Exercises   Exercises Shoulder     Shoulder Exercises: Seated   Other Seated Exercises seated ER using table AAROM x5     Shoulder Exercises: Standing   Other Standing Exercises high mat table standing for AAROM for fexion x5     Shoulder Exercises: Pulleys   Flexion 3 minutes  manual assistance required   Other Pulley Exercises seated UE ranger for flexion and small circles x46min     Moist Heat Therapy   Moist Heat Location Shoulder  with supine manual ROM     Electrical Stimulation   Electrical Stimulation Location R shoulder Premod 80-150 hz    Electrical Stimulation Goals Pain     Manual Therapy   Manual Therapy Passive ROM   Passive ROM gentle PROM for right shoulder flexion, ER, IR and elbow ext with slow movement                     PT Long Term Goals - 09/20/16 1406      PT LONG TERM GOAL #1    Title Independent with an HEP.   Time 4   Period Weeks   Status On-going     PT LONG TERM GOAL #2   Title Active right shoulder flexion to 145 degrees so the patient can easily reach overhead.   Time 4   Period Weeks   Status On-going     PT LONG TERM GOAL #3   Title Active ER to 70 degrees+ to allow for easily donning/doffing of apparel   Time 4   Period Weeks   Status On-going     PT LONG TERM GOAL #4   Title Full right elbow extension.   Time 4   Period Weeks   Status On-going     PT LONG TERM GOAL #5   Title Perform ADL's with pain not > 3/10.   Time 4   Period Weeks   Status On-going               Plan - 10/12/16 1312    Clinical Impression Statement Patient tolerated treatment well today with ongoing muscle spasms throughout treatment. Patient is limited with ROM due to pain. Patient started AAROM per MPT today. Patient has improved ROM for all motions. Patient current goals ongoing due to healing  limitations.   Rehab Potential Good   Clinical Impairments Affecting Rehab Potential surgery 08/07/16 - 8 weeks current 10/02/16 ( 09/11/16 eval 4 weeks post eval 10/09/16)   PT Frequency 2x / week   PT Duration 4 weeks   PT Treatment/Interventions ADLs/Self Care Home Management;Electrical Stimulation;Moist Heat;Ultrasound;Patient/family education;Therapeutic exercise;Therapeutic activities;Passive range of motion   PT Next Visit Plan cont with POC per MPT (MD. next week)   Consulted and Agree with Plan of Care Patient      Patient will benefit from skilled therapeutic intervention in order to improve the following deficits and impairments:  Pain, Decreased activity tolerance, Decreased range of motion  Visit Diagnosis: Stiffness of right shoulder, not elsewhere classified  Chronic right shoulder pain     Problem List Patient Active Problem List   Diagnosis Date Noted  . Traumatic partial tear of right biceps tendon 06/29/2016  . Spondylosis of cervical  region without myelopathy or radiculopathy 06/29/2016  . Systemic lupus ([place]) 09/02/2014  . Hyperlipidemia 09/02/2014  . Hypothyroidism 04/09/2013  . Depression 04/09/2013  . GAD (generalized anxiety disorder) 04/09/2013    Kandance Yano P, PTA 10/12/2016, 1:21 PM  [facility] [place], [place], [address] Phone: [phone]   Fax:  [phone]  Name: Kimberly Roth MRN: [id number] Date of Birth: October 02, 1945

## 2016-10-17 DIAGNOSIS — H16102 Unspecified superficial keratitis, left eye: Secondary | ICD-10-CM | POA: Diagnosis not present

## 2016-10-17 DIAGNOSIS — T542X1A Toxic effect of corrosive acids and acid-like substances, accidental (unintentional), initial encounter: Secondary | ICD-10-CM | POA: Diagnosis not present

## 2016-10-18 ENCOUNTER — Encounter: Payer: Self-pay | Admitting: Physical Therapy

## 2016-10-19 ENCOUNTER — Ambulatory Visit: Payer: Worker's Compensation | Admitting: Physical Therapy

## 2016-10-19 ENCOUNTER — Encounter: Payer: Self-pay | Admitting: Physical Therapy

## 2016-10-19 ENCOUNTER — Encounter (INDEPENDENT_AMBULATORY_CARE_PROVIDER_SITE_OTHER): Payer: Self-pay | Admitting: Orthopaedic Surgery

## 2016-10-19 ENCOUNTER — Ambulatory Visit (INDEPENDENT_AMBULATORY_CARE_PROVIDER_SITE_OTHER): Payer: Medicare Other | Admitting: Orthopaedic Surgery

## 2016-10-19 VITALS — Ht 64.0 in | Wt 128.0 lb

## 2016-10-19 DIAGNOSIS — M25611 Stiffness of right shoulder, not elsewhere classified: Secondary | ICD-10-CM

## 2016-10-19 DIAGNOSIS — S46211D Strain of muscle, fascia and tendon of other parts of biceps, right arm, subsequent encounter: Secondary | ICD-10-CM

## 2016-10-19 DIAGNOSIS — S4991XS Unspecified injury of right shoulder and upper arm, sequela: Secondary | ICD-10-CM

## 2016-10-19 DIAGNOSIS — M25511 Pain in right shoulder: Secondary | ICD-10-CM

## 2016-10-19 DIAGNOSIS — G8929 Other chronic pain: Secondary | ICD-10-CM

## 2016-10-19 MED ORDER — TRAMADOL HCL 50 MG PO TABS: ORAL_TABLET | ORAL | 0 refills | Status: DC

## 2016-10-19 NOTE — Therapy (Signed)
[facility] [place], [place], [address] Phone: [phone]   Fax:  [phone]  Physical Therapy Treatment  Patient Details  Name: Kimberly Roth MRN: [id number] Date of Birth: March 12, 1946 Referring Provider: Rodell Perna MD.  Encounter Date: 10/19/2016      PT End of Session - 10/19/16 1601    Visit Number 9   Number of Visits 16   Date for PT Re-Evaluation 11/06/16   PT Start Time 9166   PT Stop Time 0600   PT Time Calculation (min) 29 min   Activity Tolerance Patient limited by pain;Patient tolerated treatment well   Behavior During Therapy [facility] for tasks assessed/performed      Past Medical History:  Diagnosis Date  . Anxiety   . Breast cancer ([place])    breast bilateral  . Cataract   . History of shingles   . Hyperlipidemia   . PTSD (post-traumatic stress disorder)    related to childhood trauma  . Systemic lupus ([place])   . Uterine cancer [facility])     Past Surgical History:  Procedure Laterality Date  . ABDOMINAL HYSTERECTOMY  1976   no oopherectomy  . APPENDECTOMY    . BACK SURGERY    . BREAST IMPLANT REMOVAL Bilateral    Silicone allergy  . BREAST SURGERY Bilateral 1991   Mastectomy  . CATARACT EXTRACTION W/PHACO Right 10/30/2013   Procedure: CATARACT EXTRACTION PHACO AND INTRAOCULAR LENS PLACEMENT (IOC);  Surgeon: Tonny Branch, MD;  Location: [facility];  Service: Ophthalmology;  Laterality: Right;  CDE:10.13  . CATARACT EXTRACTION W/PHACO Left 11/10/2013   Procedure: CATARACT EXTRACTION PHACO AND INTRAOCULAR LENS PLACEMENT (IOC);  Surgeon: Tonny Branch, MD;  Location: [facility];  Service: Ophthalmology;  Laterality: Left;  CDE 9.71  . DILATION AND CURETTAGE OF UTERUS    . EYE SURGERY Bilateral 2015   Cataract - Dr Geoffry Paradise in [place]  . JOINT REPLACEMENT Left 2005   Knee x 2  . MASTECTOMY Bilateral   . PLACEMENT OF BREAST IMPLANTS Bilateral    Secondary to cancer  . RECONSTRUCTION / CORRECTION OF NIPPLE / AEROLA  Bilateral   . TONSILLECTOMY    . WISDOM TOOTH EXTRACTION Bilateral    Extraction x 4  . WRIST SURGERY Right 20's   patient not exactly sure of reason    There were no vitals filed for this visit.      Subjective Assessment - 10/19/16 1600    Subjective Reports that she went to Dr appointment and he was "jerking and pushing" on her arm per patient report. Reports that she was given a prescription for TENS unit and pulley system.   Patient Stated Goals Regain use of right UE.   Currently in Pain? Yes   Pain Score 7             [facility] PT Assessment - 10/19/16 0001      Assessment   Medical Diagnosis Traumatic tear of right biceps tendon.     Precautions   Precaution Comments MD order:  Passive right elbow flex/ext and and passive supine shoulder ROM.     Restrictions   Weight Bearing Restrictions No                     [facility] Adult PT Treatment/Exercise - 10/19/16 0001      Modalities   Modalities Electrical Stimulation;Moist Heat     Moist Heat Therapy   Number Minutes Moist Heat 25 Minutes   Moist  Heat Location Shoulder     Electrical Stimulation   Electrical Stimulation Location R shoulder   Electrical Stimulation Action Pre-Mod   Electrical Stimulation Parameters 80-150 hz x25 min   Electrical Stimulation Goals Pain     Manual Therapy   Manual Therapy Passive ROM   Passive ROM gentle PROM for right shoulder flexion, ER, IR and elbow ext with slow movement                     PT Long Term Goals - 09/20/16 1406      PT LONG TERM GOAL #1   Title Independent with an HEP.   Time 4   Period Weeks   Status On-going     PT LONG TERM GOAL #2   Title Active right shoulder flexion to 145 degrees so the patient can easily reach overhead.   Time 4   Period Weeks   Status On-going     PT LONG TERM GOAL #3   Title Active ER to 70 degrees+ to allow for easily donning/doffing of apparel   Time 4   Period Weeks   Status On-going      PT LONG TERM GOAL #4   Title Full right elbow extension.   Time 4   Period Weeks   Status On-going     PT LONG TERM GOAL #5   Title Perform ADL's with pain not > 3/10.   Time 4   Period Weeks   Status On-going               Plan - 10/19/16 1644    Clinical Impression Statement Patient tolerated treatment fairly well although she arrived with reports of increased pain and discomfort from MD handling RUE in appointment. Patient impressed by her arm not going into spasm during PROM today. PROM of R shoulder into flexion not pushed past approximately 45 deg as patient would facially grimace and RUE would jerk in quick manner. Normal response to modalities upon end of treatment. Patient provided pulley handout from clinic as she mentioned she was given one from MD.   Rehab Potential Good   Clinical Impairments Affecting Rehab Potential surgery 08/07/16 - 8 weeks current 10/02/16 ( 09/11/16 eval 4 weeks post eval 10/09/16)   PT Frequency 2x / week   PT Duration 4 weeks   PT Treatment/Interventions ADLs/Self Care Home Management;Electrical Stimulation;Moist Heat;Ultrasound;Patient/family education;Therapeutic exercise;Therapeutic activities;Passive range of motion   PT Next Visit Plan cont with POC per MPT (MD. next week)   PT Home Exercise Plan scap retraction and depression   Consulted and Agree with Plan of Care Patient      Patient will benefit from skilled therapeutic intervention in order to improve the following deficits and impairments:  Pain, Decreased activity tolerance, Decreased range of motion  Visit Diagnosis: Stiffness of right shoulder, not elsewhere classified  Chronic right shoulder pain     Problem List Patient Active Problem List   Diagnosis Date Noted  . Traumatic partial tear of right biceps tendon 06/29/2016  . Spondylosis of cervical region without myelopathy or radiculopathy 06/29/2016  . Systemic lupus ([place]) 09/02/2014  . Hyperlipidemia 09/02/2014  .  Hypothyroidism 04/09/2013  . Depression 04/09/2013  . GAD (generalized anxiety disorder) 04/09/2013    Wynelle Fanny, PTA 10/19/2016, 4:48 PM  [facility] [place], [place], [address] Phone: [phone]   Fax:  [phone]  Name: SHIA DELAINE MRN: [id number] Date of Birth: 02/01/1946

## 2016-10-19 NOTE — Progress Notes (Signed)
Post-Op Visit Note   Patient: Kimberly Roth           Date of Birth: [birth year]           MRN: [id number] Visit Date: 10/19/2016 PCP: [name], FNP   Assessment & Plan: Postop the biceps tendon tenodesis with adhesive capsulitis. With physical therapy since 08/07/2016 surgery date which is about 2 months 2 weeks she's mated only to about 90. She brought her own TENS unit prescription given for her for tends units that she can get this reimbursed which was about $40. A prescription for a pulley that she can get with a rope that she can apply in a closet door at home and do additional exercises which we went over discussed and demonstrated and discussed appropriate usage she'll do this at least couple times a day. Ultram 30 tablets prescribed 1 by mouth daily at bedtime when necessary pain. Previous prescription lasts for about a month. I'll recheck her in one month.  Chief Complaint:  Chief Complaint  Patient presents with  . Right Shoulder - Routine Post Op   Visit Diagnoses:  1. Traumatic partial tear of right biceps tendon, subsequent encounter   2. Right shoulder injury, sequela     Plan: Patient is continuing therapy and remains out of work. Additional exercises given for her was used with a pulley at home to help speed up her range of motion and prevent the recurrent adhesive capsulitis.  Follow-Up Instructions: Return in about 1 month (around 11/19/2016).   Orders:  No orders of the defined types were placed in this encounter.  Meds ordered this encounter  Medications  . traMADol (ULTRAM) 50 MG tablet    Sig: Take one po q HS    Dispense:  30 tablet    Refill:  0   HPI Patient returns for recheck. She is status post right shoulder biceps tenodesis for intra-articular biceps tendon tear on 08/07/2016. She is 10 weeks 3 days post op. This was from a Workman's Comp injury. She is continuing physical therapy. It is difficult to work with pullies, etc. It is painful.  PT has been able to get her arm away from her body at 90 degrees one time.   Imaging: No results found.  PMFS History: Patient Active Problem List   Diagnosis Date Noted  . Traumatic partial tear of right biceps tendon 06/29/2016  . Spondylosis of cervical region without myelopathy or radiculopathy 06/29/2016  . Systemic lupus ([place]) 09/02/2014  . Hyperlipidemia 09/02/2014  . Hypothyroidism 04/09/2013  . Depression 04/09/2013  . GAD (generalized anxiety disorder) 04/09/2013   Past Medical History:  Diagnosis Date  . Anxiety   . Breast cancer ([place])    breast bilateral  . Cataract   . History of shingles   . Hyperlipidemia   . PTSD (post-traumatic stress disorder)    related to childhood trauma  . Systemic lupus ([place])   . Uterine cancer ([place]     Family History  Problem Relation Age of Onset  . Heart failure Father   . Heart disease Father   . Heart attack Father 19  . Mental illness Mother   . Mental illness Sister     PTSD  . Osteoporosis Paternal Grandmother     Past Surgical History:  Procedure Laterality Date  . ABDOMINAL HYSTERECTOMY  1976   no oopherectomy  . APPENDECTOMY    . BACK SURGERY    . BREAST IMPLANT REMOVAL Bilateral    Silicone  allergy  . BREAST SURGERY Bilateral 1991   Mastectomy  . CATARACT EXTRACTION W/PHACO Right 10/30/2013   Procedure: CATARACT EXTRACTION PHACO AND INTRAOCULAR LENS PLACEMENT (IOC);  Surgeon: Tonny Branch, MD;  Location: [facility];  Service: Ophthalmology;  Laterality: Right;  CDE:10.13  . CATARACT EXTRACTION W/PHACO Left 11/10/2013   Procedure: CATARACT EXTRACTION PHACO AND INTRAOCULAR LENS PLACEMENT (IOC);  Surgeon: Tonny Branch, MD;  Location: [facility];  Service: Ophthalmology;  Laterality: Left;  CDE 9.71  . DILATION AND CURETTAGE OF UTERUS    . EYE SURGERY Bilateral 2015   Cataract - Dr Geoffry Paradise in [place]  . JOINT REPLACEMENT Left 2005   Knee x 2  . MASTECTOMY Bilateral   . PLACEMENT OF BREAST IMPLANTS Bilateral    Secondary to  cancer  . RECONSTRUCTION / CORRECTION OF NIPPLE / AEROLA Bilateral   . TONSILLECTOMY    . WISDOM TOOTH EXTRACTION Bilateral    Extraction x 4  . WRIST SURGERY Right 20's   patient not exactly sure of reason   Social History   Occupational History  . Not on file.   Social History Main Topics  . Smoking status: Never Smoker  . Smokeless tobacco: Never Used  . Alcohol use 0.6 oz/week    1 Glasses of wine per week     Comment: 1 or 2 times per year  . Drug use: No  . Sexual activity: Yes

## 2016-10-25 ENCOUNTER — Encounter: Payer: Self-pay | Admitting: Physical Therapy

## 2016-10-25 ENCOUNTER — Ambulatory Visit: Payer: Worker's Compensation | Admitting: Physical Therapy

## 2016-10-25 DIAGNOSIS — M25511 Pain in right shoulder: Principal | ICD-10-CM

## 2016-10-25 DIAGNOSIS — M25611 Stiffness of right shoulder, not elsewhere classified: Secondary | ICD-10-CM | POA: Diagnosis not present

## 2016-10-25 DIAGNOSIS — G8929 Other chronic pain: Secondary | ICD-10-CM

## 2016-10-25 NOTE — Therapy (Signed)
[facility] [place], [place], [address] Phone: [phone]   Fax:  [phone]  Physical Therapy Treatment  Patient Details  Name: Kimberly Roth MRN: [id number] Date of Birth: [birth year] Referring Provider: Rodell Perna MD.  Encounter Date: 10/25/2016      PT End of Session - 10/25/16 1418    Visit Number 10   Number of Visits 16   Date for PT Re-Evaluation 11/06/16   PT Start Time 3235   PT Stop Time 5732   PT Time Calculation (min) 59 min   Activity Tolerance Patient limited by pain;Patient tolerated treatment well   Behavior During Therapy [facility] for tasks assessed/performed      Past Medical History:  Diagnosis Date  . Anxiety   . Breast cancer ([place])    breast bilateral  . Cataract   . History of shingles   . Hyperlipidemia   . PTSD (post-traumatic stress disorder)    related to childhood trauma  . Systemic lupus ([place])   . Uterine cancer [facility])     Past Surgical History:  Procedure Laterality Date  . ABDOMINAL HYSTERECTOMY  1976   no oopherectomy  . APPENDECTOMY    . BACK SURGERY    . BREAST IMPLANT REMOVAL Bilateral    Silicone allergy  . BREAST SURGERY Bilateral 1991   Mastectomy  . CATARACT EXTRACTION W/PHACO Right 10/30/2013   Procedure: CATARACT EXTRACTION PHACO AND INTRAOCULAR LENS PLACEMENT (IOC);  Surgeon: Tonny Branch, MD;  Location: [facility];  Service: Ophthalmology;  Laterality: Right;  CDE:10.13  . CATARACT EXTRACTION W/PHACO Left 11/10/2013   Procedure: CATARACT EXTRACTION PHACO AND INTRAOCULAR LENS PLACEMENT (IOC);  Surgeon: Tonny Branch, MD;  Location: [facility];  Service: Ophthalmology;  Laterality: Left;  CDE 9.71  . DILATION AND CURETTAGE OF UTERUS    . EYE SURGERY Bilateral 2015   Cataract - Dr Geoffry Paradise in [place]  . JOINT REPLACEMENT Left 2005   Knee x 2  . MASTECTOMY Bilateral   . PLACEMENT OF BREAST IMPLANTS Bilateral    Secondary to cancer  . RECONSTRUCTION / CORRECTION OF NIPPLE / AEROLA  Bilateral   . TONSILLECTOMY    . WISDOM TOOTH EXTRACTION Bilateral    Extraction x 4  . WRIST SURGERY Right 20's   patient not exactly sure of reason    There were no vitals filed for this visit.      Subjective Assessment - 10/25/16 1402    Subjective Patient feels less pain today and doing HEP per MD instructed   Patient Stated Goals Regain use of right UE.   Pain Score 4    Pain Location Shoulder   Pain Orientation Right   Pain Descriptors / Indicators Aching;Spasm   Pain Type Surgical pain   Pain Onset More than a month ago   Pain Frequency Constant   Aggravating Factors  movement   Pain Relieving Factors at rest            [facility] PT Assessment - 10/25/16 0001      PROM   Right/Left Shoulder Right   Right Shoulder Flexion 105 Degrees   Right Shoulder External Rotation 70 Degrees                     [facility] Adult PT Treatment/Exercise - 10/25/16 0001      Shoulder Exercises: Pulleys   Flexion Other (comment)  35min, patient required manual assist and pillows under arm    Flexion Limitations starting at  90 degree flexion using pillows   Other Pulley Exercises seated UE ranger for flexion and small circles x81min     Moist Heat Therapy   Number Minutes Moist Heat 15 Minutes   Moist Heat Location Shoulder     Electrical Stimulation   Electrical Stimulation Location R shoulder   Electrical Stimulation Action premod   Electrical Stimulation Parameters 80-150hz  x60min   Electrical Stimulation Goals Pain     Manual Therapy   Manual Therapy Passive ROM   Passive ROM gentle PROM for right shoulder flexion, ER, IR and elbow ext with slow movement                     PT Long Term Goals - 09/20/16 1406      PT LONG TERM GOAL #1   Title Independent with an HEP.   Time 4   Period Weeks   Status On-going     PT LONG TERM GOAL #2   Title Active right shoulder flexion to 145 degrees so the patient can easily reach overhead.   Time 4    Period Weeks   Status On-going     PT LONG TERM GOAL #3   Title Active ER to 70 degrees+ to allow for easily donning/doffing of apparel   Time 4   Period Weeks   Status On-going     PT LONG TERM GOAL #4   Title Full right elbow extension.   Time 4   Period Weeks   Status On-going     PT LONG TERM GOAL #5   Title Perform ADL's with pain not > 3/10.   Time 4   Period Weeks   Status On-going               Plan - 10/25/16 1419    Clinical Impression Statement Patient tolerated treatment fairly well today yet some limited by pain and muscle spasms. Patient MD issued her a pulley system for home. Patient has improved with right shoulder ROM for flexion and ER today. Patient required ossilations and cues to help relax spasms and guarding. Patient unable to meet any further goals due to pain and protocol limitations.   Rehab Potential Good   Clinical Impairments Affecting Rehab Potential surgery 08/07/16 - 11 weeks current 10/25/16 ( 09/11/16 eval 6 weeks post eval 10/23/16)   PT Frequency 2x / week   PT Duration 4 weeks   PT Treatment/Interventions ADLs/Self Care Home Management;Electrical Stimulation;Moist Heat;Ultrasound;Patient/family education;Therapeutic exercise;Therapeutic activities;Passive range of motion   PT Next Visit Plan cont with POC per MPT    Consulted and Agree with Plan of Care Patient      Patient will benefit from skilled therapeutic intervention in order to improve the following deficits and impairments:  Pain, Decreased activity tolerance, Decreased range of motion  Visit Diagnosis: Chronic right shoulder pain  Stiffness of right shoulder, not elsewhere classified     Problem List Patient Active Problem List   Diagnosis Date Noted  . Traumatic partial tear of right biceps tendon 06/29/2016  . Spondylosis of cervical region without myelopathy or radiculopathy 06/29/2016  . Systemic lupus ([place]) 09/02/2014  . Hyperlipidemia 09/02/2014  .  Hypothyroidism 04/09/2013  . Depression 04/09/2013  . GAD (generalized anxiety disorder) 04/09/2013    Ladean Raya, PTA 10/25/16 2:57 PM  [facility] [place], [place], [address] Phone: [phone]   Fax:  [phone]  Name: Kimberly Roth MRN: [id number] Date of Birth: 1946-02-12

## 2016-10-30 ENCOUNTER — Encounter: Payer: Self-pay | Admitting: Physical Therapy

## 2016-10-30 ENCOUNTER — Ambulatory Visit: Payer: Worker's Compensation | Attending: Orthopaedic Surgery | Admitting: Physical Therapy

## 2016-10-30 DIAGNOSIS — G8929 Other chronic pain: Secondary | ICD-10-CM | POA: Insufficient documentation

## 2016-10-30 DIAGNOSIS — M25511 Pain in right shoulder: Secondary | ICD-10-CM | POA: Diagnosis present

## 2016-10-30 DIAGNOSIS — M25611 Stiffness of right shoulder, not elsewhere classified: Secondary | ICD-10-CM | POA: Insufficient documentation

## 2016-10-30 NOTE — Therapy (Signed)
[facility] [place], [place], [address] Phone: [phone]   Fax:  [phone]  Physical Therapy Treatment  Patient Details  Name: Kimberly Roth MRN: [id number] Date of Birth: 1945/11/04 Referring Provider: Rodell Perna MD.  Encounter Date: 10/30/2016      PT End of Session - 10/30/16 1426    Visit Number 11   Number of Visits 16   Date for PT Re-Evaluation 11/06/16   PT Start Time 6962   PT Stop Time 9528   PT Time Calculation (min) 60 min   Activity Tolerance Patient limited by pain;Patient tolerated treatment well   Behavior During Therapy [facility] for tasks assessed/performed      Past Medical History:  Diagnosis Date  . Anxiety   . Breast cancer ([place])    breast bilateral  . Cataract   . History of shingles   . Hyperlipidemia   . PTSD (post-traumatic stress disorder)    related to childhood trauma  . Systemic lupus ([place])   . Uterine cancer [facility])     Past Surgical History:  Procedure Laterality Date  . ABDOMINAL HYSTERECTOMY  1976   no oopherectomy  . APPENDECTOMY    . BACK SURGERY    . BREAST IMPLANT REMOVAL Bilateral    Silicone allergy  . BREAST SURGERY Bilateral 1991   Mastectomy  . CATARACT EXTRACTION W/PHACO Right 10/30/2013   Procedure: CATARACT EXTRACTION PHACO AND INTRAOCULAR LENS PLACEMENT (IOC);  Surgeon: Tonny Branch, MD;  Location: [facility];  Service: Ophthalmology;  Laterality: Right;  CDE:10.13  . CATARACT EXTRACTION W/PHACO Left 11/10/2013   Procedure: CATARACT EXTRACTION PHACO AND INTRAOCULAR LENS PLACEMENT (IOC);  Surgeon: Tonny Branch, MD;  Location: [facility];  Service: Ophthalmology;  Laterality: Left;  CDE 9.71  . DILATION AND CURETTAGE OF UTERUS    . EYE SURGERY Bilateral 2015   Cataract - Dr Geoffry Paradise in [place]  . JOINT REPLACEMENT Left 2005   Knee x 2  . MASTECTOMY Bilateral   . PLACEMENT OF BREAST IMPLANTS Bilateral    Secondary to cancer  . RECONSTRUCTION / CORRECTION OF NIPPLE / AEROLA  Bilateral   . TONSILLECTOMY    . WISDOM TOOTH EXTRACTION Bilateral    Extraction x 4  . WRIST SURGERY Right 20's   patient not exactly sure of reason    There were no vitals filed for this visit.      Subjective Assessment - 10/30/16 1404    Subjective Patient reported doing some cooking over weekend.   Patient Stated Goals Regain use of right UE.   Pain Score 6    Pain Location Shoulder   Pain Orientation Right   Pain Descriptors / Indicators Aching;Spasm   Pain Type Surgical pain   Pain Onset More than a month ago   Pain Frequency Constant   Aggravating Factors  any movement   Pain Relieving Factors at rest            [facility] PT Assessment - 10/30/16 0001      PROM   Right/Left Shoulder Right   Right Shoulder Flexion 114 Degrees   Right Shoulder External Rotation 70 Degrees   Right/Left Elbow Right   Right Elbow Extension 0                     [facility] Adult PT Treatment/Exercise - 10/30/16 0001      Shoulder Exercises: Supine   Other Supine Exercises attempted supin cane for AAROM unable due to spasm  Shoulder Exercises: Pulleys   Flexion Other (comment)  52mn no support needed today   Other Pulley Exercises seated UE ranger for flexion and small circles x5 min     Moist Heat Therapy   Number Minutes Moist Heat 15 Minutes   Moist Heat Location Shoulder     Electrical Stimulation   Electrical Stimulation Location R shoulder   Electrical Stimulation Action premod   Electrical Stimulation Parameters 80+-'150hz'$  x113m   Electrical Stimulation Goals Pain     Manual Therapy   Manual Therapy Passive ROM   Passive ROM gentle PROM for right shoulder flexion, ER, IR and elbow ext with slow movement                     PT Long Term Goals - 10/30/16 1440      PT LONG TERM GOAL #1   Title Independent with an HEP.   Time 4   Period Weeks   Status On-going     PT LONG TERM GOAL #2   Title Active right shoulder flexion to 145  degrees so the patient can easily reach overhead.   Time 4   Period Weeks   Status On-going     PT LONG TERM GOAL #3   Title Active ER to 70 degrees+ to allow for easily donning/doffing of apparel   Time 4   Period Weeks   Status On-going     PT LONG TERM GOAL #4   Title Full right elbow extension.   Time 4   Period Weeks   Status Achieved  10/30/16     PT LONG TERM GOAL #5   Title Perform ADL's with pain not > 3/10.   Time 4   Period Weeks   Status On-going               Plan - 10/30/16 1440    Clinical Impression Statement Patient tolerated treatment fairly well yet ongoing pain and muscle spasms which limited right shoulder flexion today. Patient has improved elbow flexion and met goal today. Other goals ongoing due to ROM/healing limitations. Patient continues to perform HEP as tolerated at home. Patient unable to perform supine AAROM for flexion today due to increased muscle spasms.    Rehab Potential Good   Clinical Impairments Affecting Rehab Potential surgery 08/07/16 - 12 weeks current 10/29/16 ( 09/11/16 eval 7 weeks post eval 10/30/16)   PT Frequency 2x / week   PT Duration 4 weeks   PT Treatment/Interventions ADLs/Self Care Home Management;Electrical Stimulation;Moist Heat;Ultrasound;Patient/family education;Therapeutic exercise;Therapeutic activities;Passive range of motion   PT Next Visit Plan cont with POC per MPT    Consulted and Agree with Plan of Care Patient      Patient will benefit from skilled therapeutic intervention in order to improve the following deficits and impairments:  Pain, Decreased activity tolerance, Decreased range of motion  Visit Diagnosis: Chronic right shoulder pain  Stiffness of right shoulder, not elsewhere classified     Problem List Patient Active Problem List   Diagnosis Date Noted  . Traumatic partial tear of right biceps tendon 06/29/2016  . Spondylosis of cervical region without myelopathy or radiculopathy 06/29/2016   . Systemic [place] ([place]/09/2014  . Hyperlipidemia 09/02/2014  . Hypothyroidism 04/09/2013  . Depression 04/09/2013  . GAD (generalized anxiety disorder) 04/09/2013    DUPhillips ClimesPTA 10/30/2016, 3:01 PM  [facility]one: [phone] Fax:  [phone]  Name: KATHARIN SCHNEIDER MRN: [id number] Date of Birth: 03/28/1946

## 2016-11-01 ENCOUNTER — Ambulatory Visit: Payer: Worker's Compensation | Admitting: Physical Therapy

## 2016-11-01 ENCOUNTER — Encounter: Payer: Self-pay | Admitting: Physical Therapy

## 2016-11-01 DIAGNOSIS — M25511 Pain in right shoulder: Principal | ICD-10-CM

## 2016-11-01 DIAGNOSIS — G8929 Other chronic pain: Secondary | ICD-10-CM

## 2016-11-01 DIAGNOSIS — M25611 Stiffness of right shoulder, not elsewhere classified: Secondary | ICD-10-CM

## 2016-11-01 NOTE — Therapy (Signed)
[facility] [place], [place], [address] Phone: [phone]   Fax:  [phone]  Physical Therapy Treatment  Patient Details  Name: Kimberly Roth MRN: [id number] Date of Birth: June 15, 1946 Referring Provider: Rodell Perna MD.  Encounter Date: 11/01/2016      PT End of Session - 11/01/16 1406    Visit Number 12   Number of Visits 16   Date for PT Re-Evaluation 11/06/16   PT Start Time 0086   PT Stop Time 1440   PT Time Calculation (min) 42 min   Activity Tolerance Patient limited by pain;Patient tolerated treatment well   Behavior During Therapy [facility] for tasks assessed/performed      Past Medical History:  Diagnosis Date  . Anxiety   . Breast cancer ([place])    breast bilateral  . Cataract   . History of shingles   . Hyperlipidemia   . PTSD (post-traumatic stress disorder)    related to childhood trauma  . Systemic lupus ([place])   . Uterine cancer [facility])     Past Surgical History:  Procedure Laterality Date  . ABDOMINAL HYSTERECTOMY  1976   no oopherectomy  . APPENDECTOMY    . BACK SURGERY    . BREAST IMPLANT REMOVAL Bilateral    Silicone allergy  . BREAST SURGERY Bilateral 1991   Mastectomy  . CATARACT EXTRACTION W/PHACO Right 10/30/2013   Procedure: CATARACT EXTRACTION PHACO AND INTRAOCULAR LENS PLACEMENT (IOC);  Surgeon: Tonny Branch, MD;  Location: [facility];  Service: Ophthalmology;  Laterality: Right;  CDE:10.13  . CATARACT EXTRACTION W/PHACO Left 11/10/2013   Procedure: CATARACT EXTRACTION PHACO AND INTRAOCULAR LENS PLACEMENT (IOC);  Surgeon: Tonny Branch, MD;  Location: [facility];  Service: Ophthalmology;  Laterality: Left;  CDE 9.71  . DILATION AND CURETTAGE OF UTERUS    . EYE SURGERY Bilateral 2015   Cataract - Dr Geoffry Paradise in [place]  . JOINT REPLACEMENT Left 2005   Knee x 2  . MASTECTOMY Bilateral   . PLACEMENT OF BREAST IMPLANTS Bilateral    Secondary to cancer  . RECONSTRUCTION / CORRECTION OF NIPPLE / AEROLA  Bilateral   . TONSILLECTOMY    . WISDOM TOOTH EXTRACTION Bilateral    Extraction x 4  . WRIST SURGERY Right 20's   patient not exactly sure of reason    There were no vitals filed for this visit.      Subjective Assessment - 11/01/16 1400    Subjective Patient reported doing too much at home which increased her pain/needed to wear a sling due to soreness and spasms   Patient Stated Goals Regain use of right UE.   Currently in Pain? Yes   Pain Score 7    Pain Location Shoulder   Pain Orientation Right   Pain Descriptors / Indicators Aching;Spasm   Pain Type Surgical pain   Pain Onset More than a month ago   Pain Frequency Constant   Aggravating Factors  any use or movement   Pain Relieving Factors at rest                         [facility] Adult PT Treatment/Exercise - 11/01/16 0001      Shoulder Exercises: Pulleys   Other Pulley Exercises seated UE ranger for flexion and small circles x5 min     Moist Heat Therapy   Number Minutes Moist Heat 15 Minutes   Moist Heat Location Shoulder     Electrical Stimulation  Electrical Stimulation Location R shoulder   Electrical Stimulation Action premod   Electrical Stimulation Parameters 80-150hz  x30min   Electrical Stimulation Goals Pain     Manual Therapy   Manual Therapy Passive ROM   Passive ROM gentle PROM for right shoulder flexion, ER, IR and elbow ext with slow movement                     PT Long Term Goals - 10/30/16 1440      PT LONG TERM GOAL #1   Title Independent with an HEP.   Time 4   Period Weeks   Status On-going     PT LONG TERM GOAL #2   Title Active right shoulder flexion to 145 degrees so the patient can easily reach overhead.   Time 4   Period Weeks   Status On-going     PT LONG TERM GOAL #3   Title Active ER to 70 degrees+ to allow for easily donning/doffing of apparel   Time 4   Period Weeks   Status On-going     PT LONG TERM GOAL #4   Title Full right elbow  extension.   Time 4   Period Weeks   Status Achieved  10/30/16     PT LONG TERM GOAL #5   Title Perform ADL's with pain not > 3/10.   Time 4   Period Weeks   Status On-going               Plan - 11/01/16 1407    Clinical Impression Statement Patient progressing slowly due to ongoing pain and spasms in right shoulder. Patient tolerated treatment fairly well yet ongoing spasms with movement. Patient goals ongoing due to heling/ROM/strength/pain deficts. Short session due to pain and spasm today.   Clinical Impairments Affecting Rehab Potential surgery 08/07/16 - 12 weeks current 10/29/16 ( 09/11/16 eval 7 weeks post eval 10/30/16)   PT Frequency 2x / week   PT Duration 4 weeks   PT Treatment/Interventions ADLs/Self Care Home Management;Electrical Stimulation;Moist Heat;Ultrasound;Patient/family education;Therapeutic exercise;Therapeutic activities;Passive range of motion   PT Next Visit Plan cont with POC per MPT    Consulted and Agree with Plan of Care Patient      Patient will benefit from skilled therapeutic intervention in order to improve the following deficits and impairments:  Pain, Decreased activity tolerance, Decreased range of motion  Visit Diagnosis: Chronic right shoulder pain  Stiffness of right shoulder, not elsewhere classified     Problem List Patient Active Problem List   Diagnosis Date Noted  . Traumatic partial tear of right biceps tendon 06/29/2016  . Spondylosis of cervical region without myelopathy or radiculopathy 06/29/2016  . Systemic lupus ([place]) 09/02/2014  . Hyperlipidemia 09/02/2014  . Hypothyroidism 04/09/2013  . Depression 04/09/2013  . GAD (generalized anxiety disorder) 04/09/2013    Phillips Climes, PTA 11/01/2016, 2:47 PM  [facility] [place], [place], [address] Phone: [phone]   Fax:  [phone]  Name: Kimberly Roth MRN: [id number] Date of Birth: 07-19-1946

## 2016-11-07 ENCOUNTER — Ambulatory Visit: Payer: Worker's Compensation | Admitting: Physical Therapy

## 2016-11-07 ENCOUNTER — Encounter: Payer: Self-pay | Admitting: Physical Therapy

## 2016-11-07 DIAGNOSIS — G8929 Other chronic pain: Secondary | ICD-10-CM

## 2016-11-07 DIAGNOSIS — M25511 Pain in right shoulder: Principal | ICD-10-CM

## 2016-11-07 DIAGNOSIS — M25611 Stiffness of right shoulder, not elsewhere classified: Secondary | ICD-10-CM

## 2016-11-07 NOTE — Therapy (Signed)
[facility] [place], [place], [address] Phone: [phone]   Fax:  [phone]  Physical Therapy Treatment  Patient Details  Name: Kimberly Roth MRN: [id number] Date of Birth: Jul 28, 1946 Referring Provider: Rodell Perna MD.  Encounter Date: 11/07/2016      PT End of Session - 11/07/16 1522    Visit Number 13   Number of Visits 16   Date for PT Re-Evaluation 11/06/16   PT Start Time 1522   PT Stop Time 1606   PT Time Calculation (min) 44 min   Activity Tolerance Patient limited by pain;Patient tolerated treatment well   Behavior During Therapy [facility] for tasks assessed/performed      Past Medical History:  Diagnosis Date  . Anxiety   . Breast cancer ([place])    breast bilateral  . Cataract   . History of shingles   . Hyperlipidemia   . PTSD (post-traumatic stress disorder)    related to childhood trauma  . Systemic lupus ([place])   . Uterine cancer [facility])     Past Surgical History:  Procedure Laterality Date  . ABDOMINAL HYSTERECTOMY  1976   no oopherectomy  . APPENDECTOMY    . BACK SURGERY    . BREAST IMPLANT REMOVAL Bilateral    Silicone allergy  . BREAST SURGERY Bilateral 1991   Mastectomy  . CATARACT EXTRACTION W/PHACO Right 10/30/2013   Procedure: CATARACT EXTRACTION PHACO AND INTRAOCULAR LENS PLACEMENT (IOC);  Surgeon: Tonny Branch, MD;  Location: [facility];  Service: Ophthalmology;  Laterality: Right;  CDE:10.13  . CATARACT EXTRACTION W/PHACO Left 11/10/2013   Procedure: CATARACT EXTRACTION PHACO AND INTRAOCULAR LENS PLACEMENT (IOC);  Surgeon: Tonny Branch, MD;  Location: [facility];  Service: Ophthalmology;  Laterality: Left;  CDE 9.71  . DILATION AND CURETTAGE OF UTERUS    . EYE SURGERY Bilateral 2015   Cataract - Dr Geoffry Paradise in [place]  . JOINT REPLACEMENT Left 2005   Knee x 2  . MASTECTOMY Bilateral   . PLACEMENT OF BREAST IMPLANTS Bilateral    Secondary to cancer  . RECONSTRUCTION / CORRECTION OF NIPPLE / AEROLA  Bilateral   . TONSILLECTOMY    . WISDOM TOOTH EXTRACTION Bilateral    Extraction x 4  . WRIST SURGERY Right 20's   patient not exactly sure of reason    There were no vitals filed for this visit.      Subjective Assessment - 11/07/16 1521    Subjective Reports soreness as she has been trying to push exercises to do better and more. Has been using green chili jar in each hand.   Patient Stated Goals Regain use of right UE.   Currently in Pain? Yes   Pain Score 5    Pain Location Shoulder   Pain Orientation Right   Pain Descriptors / Indicators Sore   Pain Type Surgical pain   Pain Onset More than a month ago            [facility] PT Assessment - 11/07/16 0001      Assessment   Medical Diagnosis Traumatic tear of right biceps tendon.   Onset Date/Surgical Date 08/07/16   Next MD Visit 11/16/2016     Restrictions   Weight Bearing Restrictions No                     [facility] Adult PT Treatment/Exercise - 11/07/16 0001      Shoulder Exercises: Standing   Other Standing Exercises high mat table  standing for AAROM for flexion x3 min     Shoulder Exercises: Pulleys   Flexion 3 minutes   Other Pulley Exercises seated UE ranger for small circles x5 min     Modalities   Modalities Electrical Stimulation;Moist Heat     Moist Heat Therapy   Number Minutes Moist Heat 15 Minutes   Moist Heat Location Shoulder     Electrical Stimulation   Electrical Stimulation Location R shoulder   Electrical Stimulation Action Pre-Mod   Electrical Stimulation Parameters 80-150 hz x15 min   Electrical Stimulation Goals Pain     Manual Therapy   Manual Therapy Passive ROM   Passive ROM Gentle PROM of R shoulder into flex/ER with holds at end range                     PT Long Term Goals - 10/30/16 1440      PT LONG TERM GOAL #1   Title Independent with an HEP.   Time 4   Period Weeks   Status On-going     PT LONG TERM GOAL #2   Title Active right shoulder  flexion to 145 degrees so the patient can easily reach overhead.   Time 4   Period Weeks   Status On-going     PT LONG TERM GOAL #3   Title Active ER to 70 degrees+ to allow for easily donning/doffing of apparel   Time 4   Period Weeks   Status On-going     PT LONG TERM GOAL #4   Title Full right elbow extension.   Time 4   Period Weeks   Status Achieved  10/30/16     PT LONG TERM GOAL #5   Title Perform ADL's with pain not > 3/10.   Time 4   Period Weeks   Status On-going               Plan - 11/07/16 1554    Clinical Impression Statement Patient tolerated today's treatment fairly well as she completed exercises very slowly and with caution. Patient experienced spasms intermittantly throughout PROM of R shoulder into flexion but had good ROM of R shoulder ER.  Normal modalities response noted following removal of the modalities.   Rehab Potential Good   Clinical Impairments Affecting Rehab Potential surgery 08/07/16 - 12 weeks current 10/29/16 ( 09/11/16 eval 7 weeks post eval 10/30/16)   PT Frequency 2x / week   PT Duration 4 weeks   PT Treatment/Interventions ADLs/Self Care Home Management;Electrical Stimulation;Moist Heat;Ultrasound;Patient/family education;Therapeutic exercise;Therapeutic activities;Passive range of motion   PT Next Visit Plan cont with POC per MPT    PT Home Exercise Plan scap retraction and depression   Consulted and Agree with Plan of Care Patient      Patient will benefit from skilled therapeutic intervention in order to improve the following deficits and impairments:  Pain, Decreased activity tolerance, Decreased range of motion  Visit Diagnosis: Chronic right shoulder pain  Stiffness of right shoulder, not elsewhere classified     Problem List Patient Active Problem List   Diagnosis Date Noted  . Traumatic partial tear of right biceps tendon 06/29/2016  . Spondylosis of cervical region without myelopathy or radiculopathy 06/29/2016  .  Systemic lupus ([place]) 09/02/2014  . Hyperlipidemia 09/02/2014  . Hypothyroidism 04/09/2013  . Depression 04/09/2013  . GAD (generalized anxiety disorder) 04/09/2013    Wynelle Fanny, PTA 11/07/2016, 4:41 PM  [facility]  [place], [place], [address] Phone: [phone]   Fax:  [phone]  Name: Kimberly Roth MRN: [id number] Date of Birth: 1945/11/18

## 2016-11-09 ENCOUNTER — Encounter: Payer: Self-pay | Admitting: Physical Therapy

## 2016-11-09 ENCOUNTER — Ambulatory Visit: Payer: Worker's Compensation | Admitting: Physical Therapy

## 2016-11-09 DIAGNOSIS — G8929 Other chronic pain: Secondary | ICD-10-CM

## 2016-11-09 DIAGNOSIS — M25611 Stiffness of right shoulder, not elsewhere classified: Secondary | ICD-10-CM

## 2016-11-09 DIAGNOSIS — M25511 Pain in right shoulder: Secondary | ICD-10-CM | POA: Diagnosis not present

## 2016-11-09 NOTE — Therapy (Signed)
[facility] [place], [place], [address] Phone: [phone]   Fax:  [phone]  Physical Therapy Treatment  Patient Details  Name: Kimberly Roth MRN: [id number] Date of Birth: January 12, 1946 Referring Provider: Rodell Perna MD.  Encounter Date: 11/09/2016      PT End of Session - 11/09/16 1501    Visit Number 14   Number of Visits 16   Date for PT Re-Evaluation 11/06/16   PT Start Time 1941   PT Stop Time 1530   PT Time Calculation (min) 41 min   Activity Tolerance Patient tolerated treatment well;Patient limited by pain   Behavior During Therapy [facility] for tasks assessed/performed      Past Medical History:  Diagnosis Date  . Anxiety   . Breast cancer ([place])    breast bilateral  . Cataract   . History of shingles   . Hyperlipidemia   . PTSD (post-traumatic stress disorder)    related to childhood trauma  . Systemic lupus ([place])   . Uterine cancer [facility])     Past Surgical History:  Procedure Laterality Date  . ABDOMINAL HYSTERECTOMY  1976   no oopherectomy  . APPENDECTOMY    . BACK SURGERY    . BREAST IMPLANT REMOVAL Bilateral    Silicone allergy  . BREAST SURGERY Bilateral 1991   Mastectomy  . CATARACT EXTRACTION W/PHACO Right 10/30/2013   Procedure: CATARACT EXTRACTION PHACO AND INTRAOCULAR LENS PLACEMENT (IOC);  Surgeon: Tonny Branch, MD;  Location: [facility];  Service: Ophthalmology;  Laterality: Right;  CDE:10.13  . CATARACT EXTRACTION W/PHACO Left 11/10/2013   Procedure: CATARACT EXTRACTION PHACO AND INTRAOCULAR LENS PLACEMENT (IOC);  Surgeon: Tonny Branch, MD;  Location: [facility];  Service: Ophthalmology;  Laterality: Left;  CDE 9.71  . DILATION AND CURETTAGE OF UTERUS    . EYE SURGERY Bilateral 2015   Cataract - Dr Geoffry Paradise in [place]  . JOINT REPLACEMENT Left 2005   Knee x 2  . MASTECTOMY Bilateral   . PLACEMENT OF BREAST IMPLANTS Bilateral    Secondary to cancer  . RECONSTRUCTION / CORRECTION OF NIPPLE / AEROLA  Bilateral   . TONSILLECTOMY    . WISDOM TOOTH EXTRACTION Bilateral    Extraction x 4  . WRIST SURGERY Right 20's   patient not exactly sure of reason    There were no vitals filed for this visit.      Subjective Assessment - 11/09/16 1455    Subjective Patient reported doina lot of exercises the past few days   Patient Stated Goals Regain use of right UE.   Currently in Pain? Yes   Pain Score 5    Pain Location Shoulder   Pain Orientation Right   Pain Descriptors / Indicators Sore   Pain Type Surgical pain   Pain Onset More than a month ago   Pain Frequency Constant   Aggravating Factors  movement    Pain Relieving Factors rest            [facility] PT Assessment - 11/09/16 0001      PROM   Right/Left Shoulder Right   Right Shoulder Flexion 150 Degrees   Right Shoulder External Rotation 70 Degrees                     [facility] Adult PT Treatment/Exercise - 11/09/16 0001      Shoulder Exercises: Pulleys   Flexion 3 minutes   Other Pulley Exercises seated UE ranger for small circles x5  min   Other Pulley Exercises wall walk 47min     Moist Heat Therapy   Number Minutes Moist Heat 15 Minutes   Moist Heat Location Shoulder     Electrical Stimulation   Electrical Stimulation Location R shoulder   Electrical Stimulation Action premod   Electrical Stimulation Parameters 80-150hz  x66min   Electrical Stimulation Goals Pain     Manual Therapy   Manual Therapy Passive ROM   Passive ROM Gentle PROM of R shoulder into flex/ER with holds at end range                     PT Long Term Goals - 10/30/16 1440      PT LONG TERM GOAL #1   Title Independent with an HEP.   Time 4   Period Weeks   Status On-going     PT LONG TERM GOAL #2   Title Active right shoulder flexion to 145 degrees so the patient can easily reach overhead.   Time 4   Period Weeks   Status On-going     PT LONG TERM GOAL #3   Title Active ER to 70 degrees+ to allow for easily  donning/doffing of apparel   Time 4   Period Weeks   Status On-going     PT LONG TERM GOAL #4   Title Full right elbow extension.   Time 4   Period Weeks   Status Achieved  10/30/16     PT LONG TERM GOAL #5   Title Perform ADL's with pain not > 3/10.   Time 4   Period Weeks   Status On-going               Plan - 11/09/16 1502    Clinical Impression Statement Patient tolerated treatment failry well today with ongoing pain and spasms in right shoulder. Patient has improved right shoulder flexion today. Patient started wall walk per MPT. Patient progressing toward goals yet ongoing due to pain, strength deficits.    Rehab Potential Good   Clinical Impairments Affecting Rehab Potential surgery 08/07/16 - 13 weeks current 11/05/16 ( 09/11/16 eval 8 weeks post eval 11/06/16)   PT Frequency 2x / week   PT Duration 4 weeks   PT Treatment/Interventions ADLs/Self Care Home Management;Electrical Stimulation;Moist Heat;Ultrasound;Patient/family education;Therapeutic exercise;Therapeutic activities;Passive range of motion   PT Next Visit Plan cont with POC per MPT with AAROM and start 4way standing isometrics  -sent follow up to MPT for re-cert   Consulted and Agree with Plan of Care Patient      Patient will benefit from skilled therapeutic intervention in order to improve the following deficits and impairments:  Pain, Decreased activity tolerance, Decreased range of motion  Visit Diagnosis: Chronic right shoulder pain  Stiffness of right shoulder, not elsewhere classified     Problem List Patient Active Problem List   Diagnosis Date Noted  . Traumatic partial tear of right biceps tendon 06/29/2016  . Spondylosis of cervical region without myelopathy or radiculopathy 06/29/2016  . Systemic lupus ([place]) 09/02/2014  . Hyperlipidemia 09/02/2014  . Hypothyroidism 04/09/2013  . Depression 04/09/2013  . GAD (generalized anxiety disorder) 04/09/2013   Kimberly Roth, PTA 11/09/16  3:32 PM  [facility] [place], [place], [address] Phone: [phone]   Fax:  [phone]  Name: Kimberly Roth MRN: [id number] Date of Birth: May 06, 1946

## 2016-11-13 ENCOUNTER — Ambulatory Visit: Payer: Worker's Compensation | Admitting: Physical Therapy

## 2016-11-13 DIAGNOSIS — M25511 Pain in right shoulder: Secondary | ICD-10-CM | POA: Diagnosis not present

## 2016-11-13 DIAGNOSIS — G8929 Other chronic pain: Secondary | ICD-10-CM

## 2016-11-13 DIAGNOSIS — M25611 Stiffness of right shoulder, not elsewhere classified: Secondary | ICD-10-CM

## 2016-11-13 NOTE — Therapy (Signed)
[facility] [place], [place], [address] Phone: [phone]   Fax:  [phone]  Physical Therapy Treatment  Patient Details  Name: Kimberly Roth MRN: [id number] Date of Birth: 17-Jan-1946 Referring Provider: [place] Perna MD.  Encounter Date: 11/13/2016      PT End of Session - 11/13/16 1601    Visit Number 15   Number of Visits 16   Date for PT Re-Evaluation 12/04/16   PT Start Time 0315      Past Medical History:  Diagnosis Date  . Anxiety   . Breast cancer ([place])    breast bilateral  . Cataract   . History of shingles   . Hyperlipidemia   . PTSD (post-traumatic stress disorder)    related to childhood trauma  . Systemic lupus ([place])   . Uterine cancer [facility])     Past Surgical History:  Procedure Laterality Date  . ABDOMINAL HYSTERECTOMY  1976   no oopherectomy  . APPENDECTOMY    . BACK SURGERY    . BREAST IMPLANT REMOVAL Bilateral    Silicone allergy  . BREAST SURGERY Bilateral 1991   Mastectomy  . CATARACT EXTRACTION W/PHACO Right 10/30/2013   Procedure: CATARACT EXTRACTION PHACO AND INTRAOCULAR LENS PLACEMENT (IOC);  Surgeon: Tonny Branch, MD;  Location: [facility];  Service: Ophthalmology;  Laterality: Right;  CDE:10.13  . CATARACT EXTRACTION W/PHACO Left 11/10/2013   Procedure: CATARACT EXTRACTION PHACO AND INTRAOCULAR LENS PLACEMENT (IOC);  Surgeon: Tonny Branch, MD;  Location: [facility];  Service: Ophthalmology;  Laterality: Left;  CDE 9.71  . DILATION AND CURETTAGE OF UTERUS    . EYE SURGERY Bilateral 2015   Cataract - Dr Geoffry Paradise in [place]  . JOINT REPLACEMENT Left 2005   Knee x 2  . MASTECTOMY Bilateral   . PLACEMENT OF BREAST IMPLANTS Bilateral    Secondary to cancer  . RECONSTRUCTION / CORRECTION OF NIPPLE / AEROLA Bilateral   . TONSILLECTOMY    . WISDOM TOOTH EXTRACTION Bilateral    Extraction x 4  . WRIST SURGERY Right 20's   patient not exactly sure of reason    There were no vitals filed for this  visit.      Subjective Assessment - 11/13/16 1517    Subjective My pain is about a 4/10 today.     Pain Score 4    Pain Location Shoulder   Pain Orientation Right   Pain Descriptors / Indicators Sore   Pain Type Surgical pain   Pain Onset More than a month ago                         [facility] Adult PT Treatment/Exercise - 11/13/16 0001      Exercises   Exercises Shoulder     Shoulder Exercises: Pulleys   Flexion Limitations 3 minutes.   Other Pulley Exercises Seated UE Ranger x 4 minutes.     Moist Heat Therapy   Number Minutes Moist Heat 20 Minutes   Moist Heat Location --  Right shoulder.     Acupuncturist Location --  Right shoulder.   Electrical Stimulation Action Pre-mod.   Electrical Stimulation Parameters 80-150 Hz x 20 minutes.   Electrical Stimulation Goals Pain     Manual Therapy   Manual Therapy Passive ROM   Passive ROM Gentle right shoulder PROM into flexion and ER in the plane of the scapula x 19 minutes.  PT Long Term Goals - 10/30/16 1440      PT LONG TERM GOAL #1   Title Independent with an HEP.   Time 4   Period Weeks   Status On-going     PT LONG TERM GOAL #2   Title Active right shoulder flexion to 145 degrees so the patient can easily reach overhead.   Time 4   Period Weeks   Status On-going     PT LONG TERM GOAL #3   Title Active ER to 70 degrees+ to allow for easily donning/doffing of apparel   Time 4   Period Weeks   Status On-going     PT LONG TERM GOAL #4   Title Full right elbow extension.   Time 4   Period Weeks   Status Achieved  10/30/16     PT LONG TERM GOAL #5   Title Perform ADL's with pain not > 3/10.   Time 4   Period Weeks   Status On-going               Plan - 11/13/16 1602    Clinical Impression Statement Patient tolerated gentle PROM to patient's right shouler with occasional muscle spasms.      Patient will  benefit from skilled therapeutic intervention in order to improve the following deficits and impairments:     Visit Diagnosis: Chronic right shoulder pain  Stiffness of right shoulder, not elsewhere classified     Problem List Patient Active Problem List   Diagnosis Date Noted  . Traumatic partial tear of right biceps tendon 06/29/2016  . Spondylosis of cervical region without myelopathy or radiculopathy 06/29/2016  . Systemic lupus ([place]) 09/02/2014  . Hyperlipidemia 09/02/2014  . Hypothyroidism 04/09/2013  . Depression 04/09/2013  . GAD (generalized anxiety disorder) 04/09/2013    Alanta Scobey, [place] MPT 11/13/2016, 4:29 PM  [facility] [place], [place], [address] Phone: [phone]   Fax:  [phone]  Name: Kimberly Roth MRN: [id number] Date of Birth: Jul 02, 1946

## 2016-11-15 ENCOUNTER — Ambulatory Visit: Payer: Worker's Compensation | Admitting: Physical Therapy

## 2016-11-15 DIAGNOSIS — G8929 Other chronic pain: Secondary | ICD-10-CM

## 2016-11-15 DIAGNOSIS — M25511 Pain in right shoulder: Principal | ICD-10-CM

## 2016-11-15 DIAGNOSIS — M25611 Stiffness of right shoulder, not elsewhere classified: Secondary | ICD-10-CM

## 2016-11-15 NOTE — Therapy (Signed)
[facility] [place], [place], [address] Phone: [phone]   Fax:  [phone]  Physical Therapy Treatment  Patient Details  Name: Kimberly Roth MRN: [id number] Date of Birth: 1945-09-27 Referring Provider: [place] Perna MD.  Encounter Date: 11/15/2016      PT End of Session - 11/15/16 1719    Visit Number 16   Number of Visits 16   Date for PT Re-Evaluation 12/04/16   PT Start Time 0145   PT Stop Time 0234   PT Time Calculation (min) 49 min   Activity Tolerance Patient tolerated treatment well;Patient limited by pain   Behavior During Therapy [facility] for tasks assessed/performed      Past Medical History:  Diagnosis Date  . Anxiety   . Breast cancer ([place])    breast bilateral  . Cataract   . History of shingles   . Hyperlipidemia   . PTSD (post-traumatic stress disorder)    related to childhood trauma  . Systemic lupus ([place])   . Uterine cancer [facility])     Past Surgical History:  Procedure Laterality Date  . ABDOMINAL HYSTERECTOMY  1976   no oopherectomy  . APPENDECTOMY    . BACK SURGERY    . BREAST IMPLANT REMOVAL Bilateral    Silicone allergy  . BREAST SURGERY Bilateral 1991   Mastectomy  . CATARACT EXTRACTION W/PHACO Right 10/30/2013   Procedure: CATARACT EXTRACTION PHACO AND INTRAOCULAR LENS PLACEMENT (IOC);  Surgeon: Tonny Branch, MD;  Location: [facility];  Service: Ophthalmology;  Laterality: Right;  CDE:10.13  . CATARACT EXTRACTION W/PHACO Left 11/10/2013   Procedure: CATARACT EXTRACTION PHACO AND INTRAOCULAR LENS PLACEMENT (IOC);  Surgeon: Tonny Branch, MD;  Location: [facility];  Service: Ophthalmology;  Laterality: Left;  CDE 9.71  . DILATION AND CURETTAGE OF UTERUS    . EYE SURGERY Bilateral 2015   Cataract - Dr Geoffry Paradise in [place]  . JOINT REPLACEMENT Left 2005   Knee x 2  . MASTECTOMY Bilateral   . PLACEMENT OF BREAST IMPLANTS Bilateral    Secondary to cancer  . RECONSTRUCTION / CORRECTION OF NIPPLE / AEROLA  Bilateral   . TONSILLECTOMY    . WISDOM TOOTH EXTRACTION Bilateral    Extraction x 4  . WRIST SURGERY Right 20's   patient not exactly sure of reason    There were no vitals filed for this visit.      Subjective Assessment - 11/15/16 1719    Subjective I was sore after that last treatment.   Patient Stated Goals Regain use of right UE.   Pain Score 4    Pain Location Shoulder   Pain Descriptors / Indicators Sore   Pain Type Surgical pain   Pain Onset More than a month ago            [facility] PT Assessment - 11/15/16 0001      PROM   Right Shoulder Flexion 150 Degrees   Right Shoulder External Rotation 75 Degrees                     [facility] Adult PT Treatment/Exercise - 11/15/16 0001      Exercises   Exercises Shoulder     Shoulder Exercises: Pulleys   Flexion Limitations 5 minutes.   Other Pulley Exercises Seated UE Ranger x 6 minutes.   Other Pulley Exercises Wall ladder x 4 minutes.     Modalities   Modalities Electrical Stimulation     Moist Heat Therapy  Number Minutes Moist Heat 15 Minutes   Moist Heat Location --  Right shoulder.     Acupuncturist Location Right shoulder.   Electrical Stimulation Action Pre-mod.   Electrical Stimulation Parameters 80-150 Hz x 15 minutes,   Electrical Stimulation Goals Pain     Manual Therapy   Manual Therapy Passive ROM   Passive ROM Gentle right shoulder PAROM and rhy stabs in supine performed to patient tolerance x 8 minutes into flexion and ER.                     PT Long Term Goals - 10/30/16 1440      PT LONG TERM GOAL #1   Title Independent with an HEP.   Time 4   Period Weeks   Status On-going     PT LONG TERM GOAL #2   Title Active right shoulder flexion to 145 degrees so the patient can easily reach overhead.   Time 4   Period Weeks   Status On-going     PT LONG TERM GOAL #3   Title Active ER to 70 degrees+ to allow for easily  donning/doffing of apparel   Time 4   Period Weeks   Status On-going     PT LONG TERM GOAL #4   Title Full right elbow extension.   Time 4   Period Weeks   Status Achieved  10/30/16     PT LONG TERM GOAL #5   Title Perform ADL's with pain not > 3/10.   Time 4   Period Weeks   Status On-going               Plan - 11/15/16 1728    Clinical Impression Statement The patient prsentsed to the clinic today with a 4/10 pain-level.  She still is very reluctant to have her shoulder range due to c/o of high pain-level.  Hr passive right shoulder flexion in supine was 150 degrees and ER= 75 degrees.      Patient will benefit from skilled therapeutic intervention in order to improve the following deficits and impairments:  Pain, Decreased activity tolerance, Decreased range of motion  Visit Diagnosis: Chronic right shoulder pain  Stiffness of right shoulder, not elsewhere classified     Problem List Patient Active Problem List   Diagnosis Date Noted  . Traumatic partial tear of right biceps tendon 06/29/2016  . Spondylosis of cervical region without myelopathy or radiculopathy 06/29/2016  . Systemic lupus ([place]) 09/02/2014  . Hyperlipidemia 09/02/2014  . Hypothyroidism 04/09/2013  . Depression 04/09/2013  . GAD (generalized anxiety disorder) 04/09/2013    Alecea Trego, [place] MPT 11/15/2016, 5:32 PM  [facility] [address] [place], [place], [address] Phone: [phone]   Fax:  [phone]  Name: Kimberly Roth MRN: [id number] Date of Birth: [birth year]

## 2016-11-16 ENCOUNTER — Encounter: Payer: Self-pay | Admitting: Physical Therapy

## 2016-11-16 ENCOUNTER — Ambulatory Visit (INDEPENDENT_AMBULATORY_CARE_PROVIDER_SITE_OTHER): Payer: Medicare Other | Admitting: Orthopaedic Surgery

## 2016-11-20 ENCOUNTER — Other Ambulatory Visit: Payer: Self-pay | Admitting: Nurse Practitioner

## 2016-11-20 ENCOUNTER — Encounter: Payer: Self-pay | Admitting: Physical Therapy

## 2016-11-20 DIAGNOSIS — F32A Depression, unspecified: Secondary | ICD-10-CM

## 2016-11-20 DIAGNOSIS — F329 Major depressive disorder, single episode, unspecified: Secondary | ICD-10-CM

## 2016-11-21 ENCOUNTER — Telehealth (INDEPENDENT_AMBULATORY_CARE_PROVIDER_SITE_OTHER): Payer: Self-pay

## 2016-11-21 ENCOUNTER — Ambulatory Visit: Payer: Worker's Compensation | Admitting: Physical Therapy

## 2016-11-21 DIAGNOSIS — M25511 Pain in right shoulder: Secondary | ICD-10-CM | POA: Diagnosis not present

## 2016-11-21 DIAGNOSIS — M25611 Stiffness of right shoulder, not elsewhere classified: Secondary | ICD-10-CM

## 2016-11-21 DIAGNOSIS — G8929 Other chronic pain: Secondary | ICD-10-CM

## 2016-11-21 NOTE — Therapy (Signed)
[facility] [place], [place], [address] Phone: [phone]   Fax:  [phone]  Physical Therapy Treatment  Patient Details  Name: Kimberly Roth MRN: [id number] Date of Birth: 28-Aug-1945 Referring Provider: Rodell Perna MD.  Encounter Date: 11/21/2016      PT End of Session - 11/21/16 1350    Visit Number 17   Number of Visits 16   Date for PT Re-Evaluation 12/04/16   PT Start Time 6294   PT Stop Time 1449   PT Time Calculation (min) 66 min   Activity Tolerance Patient tolerated treatment well   Behavior During Therapy [facility] for tasks assessed/performed      Past Medical History:  Diagnosis Date  . Anxiety   . Breast cancer ([place])    breast bilateral  . Cataract   . History of shingles   . Hyperlipidemia   . PTSD (post-traumatic stress disorder)    related to childhood trauma  . Systemic lupus ([place])   . Uterine cancer [facility])     Past Surgical History:  Procedure Laterality Date  . ABDOMINAL HYSTERECTOMY  1976   no oopherectomy  . APPENDECTOMY    . BACK SURGERY    . BREAST IMPLANT REMOVAL Bilateral    Silicone allergy  . BREAST SURGERY Bilateral 1991   Mastectomy  . CATARACT EXTRACTION W/PHACO Right 10/30/2013   Procedure: CATARACT EXTRACTION PHACO AND INTRAOCULAR LENS PLACEMENT (IOC);  Surgeon: Tonny Branch, MD;  Location: [facility];  Service: Ophthalmology;  Laterality: Right;  CDE:10.13  . CATARACT EXTRACTION W/PHACO Left 11/10/2013   Procedure: CATARACT EXTRACTION PHACO AND INTRAOCULAR LENS PLACEMENT (IOC);  Surgeon: Tonny Branch, MD;  Location: [facility];  Service: Ophthalmology;  Laterality: Left;  CDE 9.71  . DILATION AND CURETTAGE OF UTERUS    . EYE SURGERY Bilateral 2015   Cataract - Dr Geoffry Paradise in [place]  . JOINT REPLACEMENT Left 2005   Knee x 2  . MASTECTOMY Bilateral   . PLACEMENT OF BREAST IMPLANTS Bilateral    Secondary to cancer  . RECONSTRUCTION / CORRECTION OF NIPPLE / AEROLA Bilateral   .  TONSILLECTOMY    . WISDOM TOOTH EXTRACTION Bilateral    Extraction x 4  . WRIST SURGERY Right 20's   patient not exactly sure of reason    There were no vitals filed for this visit.      Subjective Assessment - 11/21/16 1349    Subjective Reports that the rain may be affecting her shoulder and making it sore.   Patient Stated Goals Regain use of right UE.   Currently in Pain? Yes   Pain Score 6    Pain Location Shoulder   Pain Orientation Right   Pain Descriptors / Indicators Sore   Pain Type Surgical pain   Pain Onset More than a month ago            [facility] PT Assessment - 11/21/16 0001      Assessment   Medical Diagnosis Traumatic tear of right biceps tendon.   Onset Date/Surgical Date 08/07/16     Precautions   Precaution Comments MD order:  Passive right elbow flex/ext and and passive supine shoulder ROM.     Restrictions   Weight Bearing Restrictions No                     [facility] Adult PT Treatment/Exercise - 11/21/16 0001      Shoulder Exercises: Supine   Protraction AAROM;Both;10 reps  Flexion AAROM;Both;10 reps     Shoulder Exercises: Pulleys   Flexion Other (comment)  x7 min   Other Pulley Exercises Standing UE ranger in circles x20 reps  completed very slowly   Other Pulley Exercises Wall ladder x1 rep due to slow completion and      Modalities   Modalities Electrical Stimulation;Moist Heat     Moist Heat Therapy   Number Minutes Moist Heat 15 Minutes   Moist Heat Location Shoulder     Electrical Stimulation   Electrical Stimulation Location R shoulder   Electrical Stimulation Action Pre-Mod   Electrical Stimulation Parameters 80-150 hz x15 min   Electrical Stimulation Goals Pain                     PT Long Term Goals - 10/30/16 1440      PT LONG TERM GOAL #1   Title Independent with an HEP.   Time 4   Period Weeks   Status On-going     PT LONG TERM GOAL #2   Title Active right shoulder flexion to 145  degrees so the patient can easily reach overhead.   Time 4   Period Weeks   Status On-going     PT LONG TERM GOAL #3   Title Active ER to 70 degrees+ to allow for easily donning/doffing of apparel   Time 4   Period Weeks   Status On-going     PT LONG TERM GOAL #4   Title Full right elbow extension.   Time 4   Period Weeks   Status Achieved  10/30/16     PT LONG TERM GOAL #5   Title Perform ADL's with pain not > 3/10.   Time 4   Period Weeks   Status On-going               Plan - 11/21/16 1456    Clinical Impression Statement Patient presented in clinic with increased R shoulder soreness secondary to weather per patient report. Reports continued compliance with pulleys at home. Completed all exercise in extremely slow manner and easily distracted with conversation. Minimal facial grimacing noted with supine AAROM exercise into flexion. Supine AAROM exercises intiated today per MPT approval. Normal modalities response noted following removal of the modalities.   Rehab Potential Good   Clinical Impairments Affecting Rehab Potential surgery 08/07/16 - 13 weeks current 11/05/16 ( 09/11/16 eval 8 weeks post eval 11/06/16)   PT Frequency 2x / week   PT Duration 4 weeks   PT Treatment/Interventions ADLs/Self Care Home Management;Electrical Stimulation;Moist Heat;Ultrasound;Patient/family education;Therapeutic exercise;Therapeutic activities;Passive range of motion   PT Next Visit Plan cont with POC per MPT with AAROM and start 4way standing isometrics  -sent follow up to MPT for re-cert   PT Home Exercise Plan scap retraction and depression   Consulted and Agree with Plan of Care Patient      Patient will benefit from skilled therapeutic intervention in order to improve the following deficits and impairments:  Pain, Decreased activity tolerance, Decreased range of motion  Visit Diagnosis: Chronic right shoulder pain  Stiffness of right shoulder, not elsewhere  classified     Problem List Patient Active Problem List   Diagnosis Date Noted  . Traumatic partial tear of right biceps tendon 06/29/2016  . Spondylosis of cervical region without myelopathy or radiculopathy 06/29/2016  . Systemic lupus ([place]) 09/02/2014  . Hyperlipidemia 09/02/2014  . Hypothyroidism 04/09/2013  . Depression 04/09/2013  . GAD (generalized anxiety disorder)  04/09/2013    Wynelle Fanny, PTA 11/21/2016, 2:59 PM  [facility] [place], [place], [address] Phone: [phone]   Fax:  [phone]  Name: JERMANI PUND MRN: [id number] Date of Birth: [birth year]

## 2016-11-21 NOTE — Telephone Encounter (Signed)
Fax the 11/23/16 office note to case mgr when ready

## 2016-11-22 ENCOUNTER — Ambulatory Visit: Payer: Worker's Compensation | Admitting: Physical Therapy

## 2016-11-22 ENCOUNTER — Encounter: Payer: Self-pay | Admitting: Physical Therapy

## 2016-11-22 DIAGNOSIS — G8929 Other chronic pain: Secondary | ICD-10-CM

## 2016-11-22 DIAGNOSIS — M25511 Pain in right shoulder: Secondary | ICD-10-CM | POA: Diagnosis not present

## 2016-11-22 DIAGNOSIS — M25611 Stiffness of right shoulder, not elsewhere classified: Secondary | ICD-10-CM

## 2016-11-22 NOTE — Patient Instructions (Signed)
Strengthening: Isometric Flexion   Using wall for resistance, press right fist into ball using light pressure. Hold __5__ seconds. Repeat __10__ times per set. Do __2__ sets per session. Do _2___ sessions per day.  http://orth.exer.us/800   Copyright  VHI. All rights reserved.  Strengthening: Isometric Extension   Using wall for resistance, press back of right arm into ball using light pressure. Hold _5___ seconds. Repeat __10__ times per set. Do __2__ sets per session. Do __2__ sessions per day.  http://orth.exer.us/804   Copyright  VHI. All rights reserved.  Strengthening: Isometric External Rotation   Using wall to provide resistance, and keeping right arm at side, press back of hand into ball using light pressure. Hold __5__ seconds. Repeat __10__ times per set. Do _2___ sets per session. Do __2__ sessions per day.  http://orth.exer.us/814   Copyright  VHI. All rights reserved.  Strengthening: Isometric Internal Rotation   Using door frame for resistance, press palm of right hand into ball using light pressure. Keep elbow in at side. Hold _5___ seconds. Repeat __10__ times per set. Do _2___ sets per session. Do __2__ sessions per day.  http://orth.exer.us/816   Copyright  VHI. All rights reserved.   

## 2016-11-22 NOTE — Therapy (Signed)
[facility] [place], [place], [address] Phone: [phone]   Fax:  [phone]  Physical Therapy Treatment  Patient Details  Name: Kimberly Roth MRN: [id number] Date of Birth: January 28, 1946 Referring Provider: Rodell Perna MD.  Encounter Date: 11/22/2016      PT End of Session - 11/22/16 1418    Visit Number 18   Date for PT Re-Evaluation 12/04/16   PT Start Time 1400   PT Stop Time 1458   PT Time Calculation (min) 58 min   Activity Tolerance Patient tolerated treatment well   Behavior During Therapy [facility] for tasks assessed/performed      Past Medical History:  Diagnosis Date  . Anxiety   . Breast cancer ([place])    breast bilateral  . Cataract   . History of shingles   . Hyperlipidemia   . PTSD (post-traumatic stress disorder)    related to childhood trauma  . Systemic lupus ([place])   . Uterine cancer [facility])     Past Surgical History:  Procedure Laterality Date  . ABDOMINAL HYSTERECTOMY  1976   no oopherectomy  . APPENDECTOMY    . BACK SURGERY    . BREAST IMPLANT REMOVAL Bilateral    Silicone allergy  . BREAST SURGERY Bilateral 1991   Mastectomy  . CATARACT EXTRACTION W/PHACO Right 10/30/2013   Procedure: CATARACT EXTRACTION PHACO AND INTRAOCULAR LENS PLACEMENT (IOC);  Surgeon: Tonny Branch, MD;  Location: [facility];  Service: Ophthalmology;  Laterality: Right;  CDE:10.13  . CATARACT EXTRACTION W/PHACO Left 11/10/2013   Procedure: CATARACT EXTRACTION PHACO AND INTRAOCULAR LENS PLACEMENT (IOC);  Surgeon: Tonny Branch, MD;  Location: [facility];  Service: Ophthalmology;  Laterality: Left;  CDE 9.71  . DILATION AND CURETTAGE OF UTERUS    . EYE SURGERY Bilateral 2015   Cataract - Dr Geoffry Paradise in [place]  . JOINT REPLACEMENT Left 2005   Knee x 2  . MASTECTOMY Bilateral   . PLACEMENT OF BREAST IMPLANTS Bilateral    Secondary to cancer  . RECONSTRUCTION / CORRECTION OF NIPPLE / AEROLA Bilateral   . TONSILLECTOMY    . WISDOM TOOTH  EXTRACTION Bilateral    Extraction x 4  . WRIST SURGERY Right 20's   patient not exactly sure of reason    There were no vitals filed for this visit.      Subjective Assessment - 11/22/16 1403    Subjective Patient reported increased pain in shoulder today   Patient Stated Goals Regain use of right UE.   Currently in Pain? Yes   Pain Score 7    Pain Location Shoulder   Pain Orientation Right   Pain Descriptors / Indicators Sore   Pain Type Surgical pain   Pain Onset More than a month ago   Pain Frequency Constant   Aggravating Factors  any use of shoulder   Pain Relieving Factors at rest            [facility] PT Assessment - 11/22/16 0001      PROM   Right Shoulder Flexion 150 Degrees   Right Shoulder External Rotation 75 Degrees                     [facility] Adult PT Treatment/Exercise - 11/22/16 0001      Shoulder Exercises: Supine   Protraction AAROM;Both  1rep too painful   Flexion AAROM;Both  1rep too painful     Shoulder Exercises: Standing   Other Standing Exercises standing 4 way  isometrics x5each way     Shoulder Exercises: Pulleys   Flexion Other (comment)  36min   Other Pulley Exercises standing UE ranger for elevation and circles x20 each   Other Pulley Exercises wall ladder x70min     Moist Heat Therapy   Number Minutes Moist Heat 15 Minutes   Moist Heat Location Shoulder     Electrical Stimulation   Electrical Stimulation Location R shoulder   Electrical Stimulation Action premod   Electrical Stimulation Parameters 80-150hz  x62min   Electrical Stimulation Goals Pain     Manual Therapy   Manual Therapy Passive ROM   Passive ROM Gentle right shoulder PAROM for flexion/ER                PT Education - 11/22/16 1418    Education provided Yes   Education Details isometrics   Person(s) Educated Patient   Methods Explanation;Demonstration;Handout   Comprehension Verbalized understanding;Returned demonstration              PT Long Term Goals - 10/30/16 1440      PT LONG TERM GOAL #1   Title Independent with an HEP.   Time 4   Period Weeks   Status On-going     PT LONG TERM GOAL #2   Title Active right shoulder flexion to 145 degrees so the patient can easily reach overhead.   Time 4   Period Weeks   Status On-going     PT LONG TERM GOAL #3   Title Active ER to 70 degrees+ to allow for easily donning/doffing of apparel   Time 4   Period Weeks   Status On-going     PT LONG TERM GOAL #4   Title Full right elbow extension.   Time 4   Period Weeks   Status Achieved  10/30/16     PT LONG TERM GOAL #5   Title Perform ADL's with pain not > 3/10.   Time 4   Period Weeks   Status On-going               Plan - 11/22/16 1441    Clinical Impression Statement Patient reported increased pain today for unknown reason. Patient able to progress slowly with activites yet limited due to pain. Patient has full PROM for goals yet unable to meet goals yet active ROM unable at this time. Patient was given HEP for 4 way shoulder isometrics.    Rehab Potential Good   Clinical Impairments Affecting Rehab Potential surgery 08/07/16 - 15 weeks current 11/19/16 ( 09/11/16 eval 10 weeks post eval 11/20/16)   PT Frequency 2x / week   PT Duration 4 weeks   PT Treatment/Interventions ADLs/Self Care Home Management;Electrical Stimulation;Moist Heat;Ultrasound;Patient/family education;Therapeutic exercise;Therapeutic activities;Passive range of motion   PT Next Visit Plan cont with POC MD. Lorin Mercy 11/23/16   Consulted and Agree with Plan of Care Patient      Patient will benefit from skilled therapeutic intervention in order to improve the following deficits and impairments:  Pain, Decreased activity tolerance, Decreased range of motion  Visit Diagnosis: Chronic right shoulder pain  Stiffness of right shoulder, not elsewhere classified     Problem List Patient Active Problem List   Diagnosis Date Noted  .  Traumatic partial tear of right biceps tendon 06/29/2016  . Spondylosis of cervical region without myelopathy or radiculopathy 06/29/2016  . Systemic lupus ([place]) 09/02/2014  . Hyperlipidemia 09/02/2014  . Hypothyroidism 04/09/2013  . Depression 04/09/2013  . GAD (generalized anxiety disorder)  04/09/2013   Ladean Raya, PTA 11/22/16 4:27 PM Mali Applegate MPT [facility] [address] [place], [place], [address] Phone: [phone]   Fax:  [phone]  Name: Kimberly Roth MRN: [id number] Date of Birth: Feb 22, 1946

## 2016-11-23 ENCOUNTER — Encounter (INDEPENDENT_AMBULATORY_CARE_PROVIDER_SITE_OTHER): Payer: Self-pay | Admitting: Orthopaedic Surgery

## 2016-11-23 ENCOUNTER — Ambulatory Visit (INDEPENDENT_AMBULATORY_CARE_PROVIDER_SITE_OTHER): Payer: Worker's Compensation | Admitting: Orthopaedic Surgery

## 2016-11-23 VITALS — BP 120/83 | HR 78 | Ht 64.0 in | Wt 132.0 lb

## 2016-11-23 DIAGNOSIS — S46211S Strain of muscle, fascia and tendon of other parts of biceps, right arm, sequela: Secondary | ICD-10-CM

## 2016-11-23 NOTE — Progress Notes (Signed)
Office Visit Note   Patient: Kimberly Roth           Date of Birth: May 30, 1946           MRN: [id number] Visit Date: 11/23/2016              Requested by: [name], [place], [place] [address] PCP: [name], FNP   Assessment & Plan: Visit Diagnoses: Postop right shoulder biceps tenodesis and manipulation for adhesive capsulitis.  Plan: Patient is improving with therapy she has been doing multiple exercises on her own at home as well. She is not improved enough to the point where she is able to resume travel nurse work. She could do some activities when she was not doing any pulling wasn't driving extensively. 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 she'll continue therapy L recheck her in one month. Work note given no work 6 weeks. Recheck 4-5 weeks.  Follow-Up Instructions: No Follow-up on file.   Orders:  No orders of the defined types were placed in this encounter.  No orders of the defined types were placed in this encounter.     Procedures: No procedures performed   Clinical Data: No additional findings.   Subjective: Chief Complaint  Patient presents with  . Right Shoulder - Follow-up    HPI patient returns post shoulder arthroscopy 08/07/2016 for right partial biceps tendon tear intra-articular with secondary adhesive capsulitis. She had biceps tenodesis performed. Biceps is in good position and she demonstrates adequate resistance strength. She still working on shoulder range of motion is making slow progress at this but can get her arm abducted within 20 of the opposite shoulder although it takes her 15  seconds to reach this position.  Review of Systems review of systems updated and is unchanged from last office visit other than the progress with shoulder range of motion with therapy.   Objective: Vital Signs: BP 120/83   Pulse 78   Ht 5\' 4"  (1.626 m)   Wt 132 lb (59.9 kg)    BMI 22.66 kg/m   Physical Exam  Constitutional: She is oriented to person, place, and time. She appears well-developed.  HENT:  Head: Normocephalic.  Right Ear: External ear normal.  Left Ear: External ear normal.  Eyes: Pupils are equal, round, and reactive to light.  Neck: No tracheal deviation present. No thyromegaly present.  Cardiovascular: Normal rate.   Pulmonary/Chest: Effort normal.  Abdominal: Soft.  Neurological: She is alert and oriented to person, place, and time.  Skin: Skin is warm and dry.  Psychiatric: She has a normal mood and affect. Her behavior is normal.    Ortho Exam  Specialty Comments:  No specialty comments available.  Imaging: No results found.   PMFS History: Patient Active Problem List   Diagnosis Date Noted  . Traumatic partial tear of right biceps tendon 06/29/2016  . Spondylosis of cervical region without myelopathy or radiculopathy 06/29/2016  . Systemic lupus ([place]) 09/02/2014  . Hyperlipidemia 09/02/2014  . Hypothyroidism 04/09/2013  . Depression 04/09/2013  . GAD (generalized anxiety disorder) 04/09/2013   Past Medical History:  Diagnosis Date  . Anxiety   . Breast cancer ([place])    breast bilateral  . Cataract   . History of shingles   . Hyperlipidemia   . PTSD (post-traumatic stress disorder)    related to childhood trauma  . Systemic lupus ([place])   .  Uterine cancer ([place])     Family History  Problem Relation Age of Onset  . Heart failure Father   . Heart disease Father   . Heart attack Father 61  . Mental illness Mother   . Mental illness Sister     PTSD  . Osteoporosis Paternal Grandmother     Past Surgical History:  Procedure Laterality Date  . ABDOMINAL HYSTERECTOMY  1976   no oopherectomy  . APPENDECTOMY    . BACK SURGERY    . BREAST IMPLANT REMOVAL Bilateral    Silicone allergy  . BREAST SURGERY Bilateral 1991   Mastectomy  . CATARACT EXTRACTION W/PHACO Right 10/30/2013   Procedure: CATARACT EXTRACTION PHACO  AND INTRAOCULAR LENS PLACEMENT (IOC);  Surgeon: Tonny Branch, MD;  Location: [facility];  Service: Ophthalmology;  Laterality: Right;  CDE:10.13  . CATARACT EXTRACTION W/PHACO Left 11/10/2013   Procedure: CATARACT EXTRACTION PHACO AND INTRAOCULAR LENS PLACEMENT (IOC);  Surgeon: Tonny Branch, MD;  Location: [facility];  Service: Ophthalmology;  Laterality: Left;  CDE 9.71  . DILATION AND CURETTAGE OF UTERUS    . EYE SURGERY Bilateral 2015   Cataract - Dr Geoffry Paradise in [place]  . JOINT REPLACEMENT Left 2005   Knee x 2  . MASTECTOMY Bilateral   . PLACEMENT OF BREAST IMPLANTS Bilateral    Secondary to cancer  . RECONSTRUCTION / CORRECTION OF NIPPLE / AEROLA Bilateral   . TONSILLECTOMY    . WISDOM TOOTH EXTRACTION Bilateral    Extraction x 4  . WRIST SURGERY Right 20's   patient not exactly sure of reason   Social History   Occupational History  . Not on file.   Social History Main Topics  . Smoking status: Never Smoker  . Smokeless tobacco: Never Used  . Alcohol use 0.6 oz/week    1 Glasses of wine per week     Comment: 1 or 2 times per year  . Drug use: No  . Sexual activity: Yes

## 2016-11-24 NOTE — Telephone Encounter (Signed)
faxed

## 2016-11-27 ENCOUNTER — Ambulatory Visit: Payer: Worker's Compensation | Admitting: Physical Therapy

## 2016-11-27 ENCOUNTER — Encounter: Payer: Self-pay | Admitting: Physical Therapy

## 2016-11-27 DIAGNOSIS — M25511 Pain in right shoulder: Secondary | ICD-10-CM | POA: Diagnosis not present

## 2016-11-27 DIAGNOSIS — G8929 Other chronic pain: Secondary | ICD-10-CM

## 2016-11-27 DIAGNOSIS — M25611 Stiffness of right shoulder, not elsewhere classified: Secondary | ICD-10-CM

## 2016-11-27 NOTE — Therapy (Signed)
[facility] [place], [place], [address] Phone: [phone]   Fax:  [phone]  Physical Therapy Treatment  Patient Details  Name: Kimberly Roth MRN: [id number] Date of Birth: [birth year] Referring Provider: Rodell Perna MD.  Encounter Date: 11/27/2016      PT End of Session - 11/27/16 1421    Visit Number 19   Number of Visits 24  24   Date for PT Re-Evaluation 12/08/16   PT Start Time 2423   PT Stop Time 1450   PT Time Calculation (min) 56 min   Activity Tolerance Patient tolerated treatment well;Patient limited by pain   Behavior During Therapy [facility] for tasks assessed/performed      Past Medical History:  Diagnosis Date  . Anxiety   . Breast cancer ([place])    breast bilateral  . Cataract   . History of shingles   . Hyperlipidemia   . PTSD (post-traumatic stress disorder)    related to childhood trauma  . Systemic lupus ([place])   . Uterine cancer [facility])     Past Surgical History:  Procedure Laterality Date  . ABDOMINAL HYSTERECTOMY  1976   no oopherectomy  . APPENDECTOMY    . BACK SURGERY    . BREAST IMPLANT REMOVAL Bilateral    Silicone allergy  . BREAST SURGERY Bilateral 1991   Mastectomy  . CATARACT EXTRACTION W/PHACO Right 10/30/2013   Procedure: CATARACT EXTRACTION PHACO AND INTRAOCULAR LENS PLACEMENT (IOC);  Surgeon: Tonny Branch, MD;  Location: [facility];  Service: Ophthalmology;  Laterality: Right;  CDE:10.13  . CATARACT EXTRACTION W/PHACO Left 11/10/2013   Procedure: CATARACT EXTRACTION PHACO AND INTRAOCULAR LENS PLACEMENT (IOC);  Surgeon: Tonny Branch, MD;  Location: [facility];  Service: Ophthalmology;  Laterality: Left;  CDE 9.71  . DILATION AND CURETTAGE OF UTERUS    . EYE SURGERY Bilateral 2015   Cataract - Dr Geoffry Paradise in [place]  . JOINT REPLACEMENT Left 2005   Knee x 2  . MASTECTOMY Bilateral   . PLACEMENT OF BREAST IMPLANTS Bilateral    Secondary to cancer  . RECONSTRUCTION / CORRECTION OF NIPPLE /  AEROLA Bilateral   . TONSILLECTOMY    . WISDOM TOOTH EXTRACTION Bilateral    Extraction x 4  . WRIST SURGERY Right 20's   patient not exactly sure of reason    There were no vitals filed for this visit.      Subjective Assessment - 11/27/16 1355    Subjective Patient went to MD and is to continue therapy and is progressing well overall just slow pace and will go back in 6 weeks   Patient Stated Goals Regain use of right UE.   Currently in Pain? Yes   Pain Score 7    Pain Location Shoulder   Pain Orientation Right   Pain Descriptors / Indicators Sore   Pain Type Surgical pain   Pain Onset More than a month ago   Pain Frequency Constant   Aggravating Factors  any use or movement of shoulder   Pain Relieving Factors at rest                         [facility] Adult PT Treatment/Exercise - 11/27/16 0001      Shoulder Exercises: Supine   Protraction AAROM;Both   Flexion AAROM;Both     Shoulder Exercises: Pulleys   Flexion Other (comment)  85min   Other Pulley Exercises standing UE ranger for elevation and circles x20  each   Other Pulley Exercises wall ladder x58min  #20     Moist Heat Therapy   Number Minutes Moist Heat 15 Minutes   Moist Heat Location Shoulder     Electrical Stimulation   Electrical Stimulation Location R shoulder   Electrical Stimulation Action premod   Electrical Stimulation Parameters 80-150hz  x2min   Electrical Stimulation Goals Pain     Manual Therapy   Manual Therapy Passive ROM   Passive ROM Gentle right shoulder PAROM for flexion/ER, AAROM for ceiling punches x10, circles at 90 degrees x10 each way, rhythmic stabs for IR/ER in scaption                     PT Long Term Goals - 10/30/16 1440      PT LONG TERM GOAL #1   Title Independent with an HEP.   Time 4   Period Weeks   Status On-going     PT LONG TERM GOAL #2   Title Active right shoulder flexion to 145 degrees so the patient can easily reach overhead.    Time 4   Period Weeks   Status On-going     PT LONG TERM GOAL #3   Title Active ER to 70 degrees+ to allow for easily donning/doffing of apparel   Time 4   Period Weeks   Status On-going     PT LONG TERM GOAL #4   Title Full right elbow extension.   Time 4   Period Weeks   Status Achieved  10/30/16     PT LONG TERM GOAL #5   Title Perform ADL's with pain not > 3/10.   Time 4   Period Weeks   Status On-going               Plan - 11/27/16 1432    Clinical Impression Statement Patient tolerated treatment fairly well yet limited by pain. Patient able to progress with AAROM yet ongoing spasms in right shoulder with movements. Patient reported increased pain today due to doing too much at home and attempted to cook yet unable. Patient goals ongoing due to pain, strength limitations.    Rehab Potential Good   Clinical Impairments Affecting Rehab Potential surgery 08/07/16 - 15 weeks current 11/19/16 ( 09/11/16 eval 10 weeks post eval 11/20/16)   PT Frequency 2x / week   PT Duration 4 weeks   PT Treatment/Interventions ADLs/Self Care Home Management;Electrical Stimulation;Moist Heat;Ultrasound;Patient/family education;Therapeutic exercise;Therapeutic activities;Passive range of motion   PT Next Visit Plan cont with POC    Consulted and Agree with Plan of Care Patient      Patient will benefit from skilled therapeutic intervention in order to improve the following deficits and impairments:  Pain, Decreased activity tolerance, Decreased range of motion  Visit Diagnosis: Chronic right shoulder pain  Stiffness of right shoulder, not elsewhere classified     Problem List Patient Active Problem List   Diagnosis Date Noted  . Traumatic partial tear of right biceps tendon 06/29/2016  . Spondylosis of cervical region without myelopathy or radiculopathy 06/29/2016  . Systemic lupus ([place]) 09/02/2014  . Hyperlipidemia 09/02/2014  . Hypothyroidism 04/09/2013  . Depression 04/09/2013   . GAD (generalized anxiety disorder) 04/09/2013    Phillips Climes, PTA 11/27/2016, 2:54 PM  [facility] [place], [place], [address] Phone: [phone]   Fax:  [phone]  Name: Kimberly Roth MRN: [id number] Date of Birth: 1945/10/14

## 2016-11-29 DIAGNOSIS — M79605 Pain in left leg: Secondary | ICD-10-CM | POA: Diagnosis not present

## 2016-11-29 DIAGNOSIS — W57XXXA Bitten or stung by nonvenomous insect and other nonvenomous arthropods, initial encounter: Secondary | ICD-10-CM | POA: Diagnosis not present

## 2016-11-30 ENCOUNTER — Encounter: Payer: Self-pay | Admitting: Physical Therapy

## 2016-12-04 DIAGNOSIS — H04123 Dry eye syndrome of bilateral lacrimal glands: Secondary | ICD-10-CM | POA: Diagnosis not present

## 2016-12-04 DIAGNOSIS — Z961 Presence of intraocular lens: Secondary | ICD-10-CM | POA: Diagnosis not present

## 2016-12-05 ENCOUNTER — Ambulatory Visit: Payer: Worker's Compensation | Attending: Orthopaedic Surgery | Admitting: Physical Therapy

## 2016-12-05 ENCOUNTER — Other Ambulatory Visit: Payer: Self-pay | Admitting: Nurse Practitioner

## 2016-12-05 ENCOUNTER — Telehealth (INDEPENDENT_AMBULATORY_CARE_PROVIDER_SITE_OTHER): Payer: Self-pay | Admitting: Orthopaedic Surgery

## 2016-12-05 DIAGNOSIS — M25511 Pain in right shoulder: Secondary | ICD-10-CM | POA: Insufficient documentation

## 2016-12-05 DIAGNOSIS — G8929 Other chronic pain: Secondary | ICD-10-CM | POA: Insufficient documentation

## 2016-12-05 DIAGNOSIS — M25611 Stiffness of right shoulder, not elsewhere classified: Secondary | ICD-10-CM | POA: Diagnosis present

## 2016-12-05 DIAGNOSIS — F32A Depression, unspecified: Secondary | ICD-10-CM

## 2016-12-05 DIAGNOSIS — F329 Major depressive disorder, single episode, unspecified: Secondary | ICD-10-CM

## 2016-12-05 DIAGNOSIS — M25612 Stiffness of left shoulder, not elsewhere classified: Secondary | ICD-10-CM | POA: Diagnosis present

## 2016-12-05 DIAGNOSIS — M25512 Pain in left shoulder: Secondary | ICD-10-CM | POA: Diagnosis present

## 2016-12-05 NOTE — Therapy (Signed)
[facility] [place], [place], [address] Phone: [phone]   Fax:  [phone]  Physical Therapy Treatment  Patient Details  Name: SHAMARRA WARDA MRN: [id number] Date of Birth: 1946-02-26 Referring Provider: Rodell Perna MD.  Encounter Date: 12/05/2016      PT End of Session - 12/05/16 1534    Activity Tolerance Patient limited by pain   Behavior During Therapy [facility] for tasks assessed/performed      Past Medical History:  Diagnosis Date  . Anxiety   . Breast cancer ([place])    breast bilateral  . Cataract   . History of shingles   . Hyperlipidemia   . PTSD (post-traumatic stress disorder)    related to childhood trauma  . Systemic lupus ([place])   . Uterine cancer [facility])     Past Surgical History:  Procedure Laterality Date  . ABDOMINAL HYSTERECTOMY  1976   no oopherectomy  . APPENDECTOMY    . BACK SURGERY    . BREAST IMPLANT REMOVAL Bilateral    Silicone allergy  . BREAST SURGERY Bilateral 1991   Mastectomy  . CATARACT EXTRACTION W/PHACO Right 10/30/2013   Procedure: CATARACT EXTRACTION PHACO AND INTRAOCULAR LENS PLACEMENT (IOC);  Surgeon: Tonny Branch, MD;  Location: [facility];  Service: Ophthalmology;  Laterality: Right;  CDE:10.13  . CATARACT EXTRACTION W/PHACO Left 11/10/2013   Procedure: CATARACT EXTRACTION PHACO AND INTRAOCULAR LENS PLACEMENT (IOC);  Surgeon: Tonny Branch, MD;  Location: [facility];  Service: Ophthalmology;  Laterality: Left;  CDE 9.71  . DILATION AND CURETTAGE OF UTERUS    . EYE SURGERY Bilateral 2015   Cataract - Dr Geoffry Paradise in [place]  . JOINT REPLACEMENT Left 2005   Knee x 2  . MASTECTOMY Bilateral   . PLACEMENT OF BREAST IMPLANTS Bilateral    Secondary to cancer  . RECONSTRUCTION / CORRECTION OF NIPPLE / AEROLA Bilateral   . TONSILLECTOMY    . WISDOM TOOTH EXTRACTION Bilateral    Extraction x 4  . WRIST SURGERY Right 20's   patient not exactly sure of reason    There were no vitals filed for  this visit.      Subjective Assessment - 12/05/16 1523    Subjective I didn't take my pain medication.   Patient Stated Goals Regain use of right UE.   Pain Score 8    Pain Location Shoulder   Pain Orientation Right   Pain Descriptors / Indicators Sore   Pain Type Surgical pain   Pain Onset More than a month ago                                      PT Long Term Goals - 10/30/16 1440      PT LONG TERM GOAL #1   Title Independent with an HEP.   Time 4   Period Weeks   Status On-going     PT LONG TERM GOAL #2   Title Active right shoulder flexion to 145 degrees so the patient can easily reach overhead.   Time 4   Period Weeks   Status On-going     PT LONG TERM GOAL #3   Title Active ER to 70 degrees+ to allow for easily donning/doffing of apparel   Time 4   Period Weeks   Status On-going     PT LONG TERM GOAL #4   Title Full right elbow extension.  Time 4   Period Weeks   Status Achieved  10/30/16     PT LONG TERM GOAL #5   Title Perform ADL's with pain not > 3/10.   Time 4   Period Weeks   Status On-going               Plan - 12/05/16 1535    Clinical Impression Statement Patient did not take pain medication and was unable to tolerate a full treatment today.      Patient will benefit from skilled therapeutic intervention in order to improve the following deficits and impairments:  Pain, Decreased activity tolerance, Decreased range of motion  Visit Diagnosis: Chronic right shoulder pain  Stiffness of right shoulder, not elsewhere classified     Problem List Patient Active Problem List   Diagnosis Date Noted  . Traumatic partial tear of right biceps tendon 06/29/2016  . Spondylosis of cervical region without myelopathy or radiculopathy 06/29/2016  . Systemic lupus ([place]) 09/02/2014  . Hyperlipidemia 09/02/2014  . Hypothyroidism 04/09/2013  . Depression 04/09/2013  . GAD (generalized anxiety disorder) 04/09/2013    Treatment:  Pulleys x 5 minutes; seated UE Ranger x 5 minutes and right shoulder PROM x 5 minutes.  Pre-mod-stim x 8 minutes.  Patient limited by pain today and due to this asked to conclude her treatment early. Lataisha Colan, [place] MPT 12/05/2016, 3:36 PM  [facility] [address] [place], [place], [address] Phone: [phone]   Fax:  [phone]  Name: LIZET KELSO MRN: [id number] Date of Birth: Apr 21, 1946

## 2016-12-05 NOTE — Telephone Encounter (Signed)
06/29/2016 ov note emailed to Aimee @ [employer]

## 2016-12-07 ENCOUNTER — Ambulatory Visit: Payer: Worker's Compensation | Admitting: Physical Therapy

## 2016-12-07 DIAGNOSIS — M25511 Pain in right shoulder: Secondary | ICD-10-CM | POA: Diagnosis not present

## 2016-12-07 DIAGNOSIS — M25612 Stiffness of left shoulder, not elsewhere classified: Secondary | ICD-10-CM

## 2016-12-07 DIAGNOSIS — M25512 Pain in left shoulder: Secondary | ICD-10-CM

## 2016-12-07 NOTE — Therapy (Signed)
[facility] [place], [place], [address] Phone: [phone]   Fax:  [phone]  Physical Therapy Treatment  Patient Details  Name: SHACONDA HAJDUK MRN: [id number] Date of Birth: 03/11/1946 Referring Provider: Rodell Perna MD.  Encounter Date: 12/07/2016      PT End of Session - 12/07/16 1453    Visit Number 22   Number of Visits 24   Date for PT Re-Evaluation 12/08/16   PT Start Time 0146      Past Medical History:  Diagnosis Date  . Anxiety   . Breast cancer ([place])    breast bilateral  . Cataract   . History of shingles   . Hyperlipidemia   . PTSD (post-traumatic stress disorder)    related to childhood trauma  . Systemic lupus ([place])   . Uterine cancer [facility])     Past Surgical History:  Procedure Laterality Date  . ABDOMINAL HYSTERECTOMY  1976   no oopherectomy  . APPENDECTOMY    . BACK SURGERY    . BREAST IMPLANT REMOVAL Bilateral    Silicone allergy  . BREAST SURGERY Bilateral 1991   Mastectomy  . CATARACT EXTRACTION W/PHACO Right 10/30/2013   Procedure: CATARACT EXTRACTION PHACO AND INTRAOCULAR LENS PLACEMENT (IOC);  Surgeon: Tonny Branch, MD;  Location: [facility];  Service: Ophthalmology;  Laterality: Right;  CDE:10.13  . CATARACT EXTRACTION W/PHACO Left 11/10/2013   Procedure: CATARACT EXTRACTION PHACO AND INTRAOCULAR LENS PLACEMENT (IOC);  Surgeon: Tonny Branch, MD;  Location: [facility];  Service: Ophthalmology;  Laterality: Left;  CDE 9.71  . DILATION AND CURETTAGE OF UTERUS    . EYE SURGERY Bilateral 2015   Cataract - Dr Geoffry Paradise in [place]  . JOINT REPLACEMENT Left 2005   Knee x 2  . MASTECTOMY Bilateral   . PLACEMENT OF BREAST IMPLANTS Bilateral    Secondary to cancer  . RECONSTRUCTION / CORRECTION OF NIPPLE / AEROLA Bilateral   . TONSILLECTOMY    . WISDOM TOOTH EXTRACTION Bilateral    Extraction x 4  . WRIST SURGERY Right 20's   patient not exactly sure of reason    There were no vitals filed for this  visit.      Subjective Assessment - 12/07/16 1453    Subjective I took my pain medication today.   Patient Stated Goals Regain use of right UE.   Pain Score 5    Pain Location Back   Pain Orientation Right   Pain Descriptors / Indicators Sore   Pain Type Surgical pain   Pain Onset More than a month ago                         [facility] Adult PT Treatment/Exercise - 12/07/16 0001      Exercises   Exercises Shoulder;Knee/Hip     Shoulder Exercises: Pulleys   Other Pulley Exercises Pulleys x 5 minutes.   Other Pulley Exercises Seated UE ranger x 5 minutes f/b wall ladder x 3 minutes.     Shoulder Exercises: ROM/Strengthening   UBE (Upper Arm Bike) 6 minutes at 120 rpm's with patient moving right UE with left.     Modalities   Modalities Chiropractor Stimulation Location right shoulder.   Electrical Stimulation Action Pre-mod.   Electrical Stimulation Parameters 80-150 Hz x 15 minutes.   Electrical Stimulation Goals Pain     Manual Therapy   Manual Therapy Passive ROM  Passive ROM Gentle right shoulder PAROM in supine x 5 minutes.                     PT Long Term Goals - 10/30/16 1440      PT LONG TERM GOAL #1   Title Independent with an HEP.   Time 4   Period Weeks   Status On-going     PT LONG TERM GOAL #2   Title Active right shoulder flexion to 145 degrees so the patient can easily reach overhead.   Time 4   Period Weeks   Status On-going     PT LONG TERM GOAL #3   Title Active ER to 70 degrees+ to allow for easily donning/doffing of apparel   Time 4   Period Weeks   Status On-going     PT LONG TERM GOAL #4   Title Full right elbow extension.   Time 4   Period Weeks   Status Achieved  10/30/16     PT LONG TERM GOAL #5   Title Perform ADL's with pain not > 3/10.   Time 4   Period Weeks   Status On-going             Patient will benefit from skilled therapeutic  intervention in order to improve the following deficits and impairments:     Visit Diagnosis: No diagnosis found.     Problem List Patient Active Problem List   Diagnosis Date Noted  . Traumatic partial tear of right biceps tendon 06/29/2016  . Spondylosis of cervical region without myelopathy or radiculopathy 06/29/2016  . Systemic lupus ([place]) 09/02/2014  . Hyperlipidemia 09/02/2014  . Hypothyroidism 04/09/2013  . Depression 04/09/2013  . GAD (generalized anxiety disorder) 04/09/2013    Nilsa Macht, [place] MPT 12/07/2016, 3:03 PM  [facility] [address] [place], [place], [address] Phone: [phone]   Fax:  [phone]  Name: TASHINA CREDIT MRN: [id number] Date of Birth: [birth year]

## 2016-12-11 DIAGNOSIS — G44219 Episodic tension-type headache, not intractable: Secondary | ICD-10-CM | POA: Diagnosis not present

## 2016-12-11 DIAGNOSIS — H40033 Anatomical narrow angle, bilateral: Secondary | ICD-10-CM | POA: Diagnosis not present

## 2016-12-12 ENCOUNTER — Ambulatory Visit: Payer: Worker's Compensation | Admitting: Physical Therapy

## 2016-12-12 DIAGNOSIS — M25611 Stiffness of right shoulder, not elsewhere classified: Secondary | ICD-10-CM

## 2016-12-12 DIAGNOSIS — G8929 Other chronic pain: Secondary | ICD-10-CM

## 2016-12-12 DIAGNOSIS — M25511 Pain in right shoulder: Principal | ICD-10-CM

## 2016-12-12 NOTE — Therapy (Signed)
[facility] [place], [place], [address] Phone: [phone]   Fax:  [phone]  Physical Therapy Treatment  Patient Details  Name: Kimberly Roth MRN: [id number] Date of Birth: 11/08/1945 Referring Provider: Rodell Perna MD.  Encounter Date: 12/12/2016      PT End of Session - 12/12/16 1544    Visit Number 23   Number of Visits 24   Date for PT Re-Evaluation 12/08/16   PT Start Time 7253   PT Stop Time 0334   PT Time Calculation (min) 57 min      Past Medical History:  Diagnosis Date  . Anxiety   . Breast cancer ([place])    breast bilateral  . Cataract   . History of shingles   . Hyperlipidemia   . PTSD (post-traumatic stress disorder)    related to childhood trauma  . Systemic lupus ([place])   . Uterine cancer [facility])     Past Surgical History:  Procedure Laterality Date  . ABDOMINAL HYSTERECTOMY  1976   no oopherectomy  . APPENDECTOMY    . BACK SURGERY    . BREAST IMPLANT REMOVAL Bilateral    Silicone allergy  . BREAST SURGERY Bilateral 1991   Mastectomy  . CATARACT EXTRACTION W/PHACO Right 10/30/2013   Procedure: CATARACT EXTRACTION PHACO AND INTRAOCULAR LENS PLACEMENT (IOC);  Surgeon: Tonny Branch, MD;  Location: [facility];  Service: Ophthalmology;  Laterality: Right;  CDE:10.13  . CATARACT EXTRACTION W/PHACO Left 11/10/2013   Procedure: CATARACT EXTRACTION PHACO AND INTRAOCULAR LENS PLACEMENT (IOC);  Surgeon: Tonny Branch, MD;  Location: [facility];  Service: Ophthalmology;  Laterality: Left;  CDE 9.71  . DILATION AND CURETTAGE OF UTERUS    . EYE SURGERY Bilateral 2015   Cataract - Dr Geoffry Paradise in [place]  . JOINT REPLACEMENT Left 2005   Knee x 2  . MASTECTOMY Bilateral   . PLACEMENT OF BREAST IMPLANTS Bilateral    Secondary to cancer  . RECONSTRUCTION / CORRECTION OF NIPPLE / AEROLA Bilateral   . TONSILLECTOMY    . WISDOM TOOTH EXTRACTION Bilateral    Extraction x 4  . WRIST SURGERY Right 20's   patient not exactly  sure of reason    There were no vitals filed for this visit.      Subjective Assessment - 12/12/16 1531    Subjective No new complaints.   Pain Score 5    Pain Location Shoulder   Pain Orientation Right   Pain Descriptors / Indicators Sore   Pain Type Surgical pain   Pain Onset More than a month ago                         [facility] Adult PT Treatment/Exercise - 12/12/16 0001      Exercises   Exercises Shoulder     Shoulder Exercises: Pulleys   Flexion Limitations 6 minutes.   Other Pulley Exercises Seated UE Ranger x 5 minutes.     Shoulder Exercises: ROM/Strengthening   UBE (Upper Arm Bike) 6 minutes on UBE at 120 RPM's active-assistive in nature.     Modalities   Modalities Electrical Stimulation     Moist Heat Therapy   Number Minutes Moist Heat 20 Minutes   Moist Heat Location --  Right shoulder.     Acupuncturist Location Right shoulder.   Electrical Stimulation Action Constant Pre-mos.   Electrical Stimulation Parameters 80-150 Hz x 20 minutes.   Printmaker  Goals Pain     Manual Therapy   Manual Therapy Passive ROM   Passive ROM Gentle PAROM to patient's right shoulder in supine x 10 minutes into flexion and ER.                     PT Long Term Goals - 10/30/16 1440      PT LONG TERM GOAL #1   Title Independent with an HEP.   Time 4   Period Weeks   Status On-going     PT LONG TERM GOAL #2   Title Active right shoulder flexion to 145 degrees so the patient can easily reach overhead.   Time 4   Period Weeks   Status On-going     PT LONG TERM GOAL #3   Title Active ER to 70 degrees+ to allow for easily donning/doffing of apparel   Time 4   Period Weeks   Status On-going     PT LONG TERM GOAL #4   Title Full right elbow extension.   Time 4   Period Weeks   Status Achieved  10/30/16     PT LONG TERM GOAL #5   Title Perform ADL's with pain not > 3/10.   Time 4    Period Weeks   Status On-going               Plan - 12/12/16 1535    Clinical Impression Statement Patient continues to have minimal tolerance for right shoulder range of motion grabbing her right arm with her left during treatment c/o pain and "muscle spasms."      Patient will benefit from skilled therapeutic intervention in order to improve the following deficits and impairments:  Pain, Decreased activity tolerance, Decreased range of motion  Visit Diagnosis: Chronic right shoulder pain  Stiffness of right shoulder, not elsewhere classified     Problem List Patient Active Problem List   Diagnosis Date Noted  . Traumatic partial tear of right biceps tendon 06/29/2016  . Spondylosis of cervical region without myelopathy or radiculopathy 06/29/2016  . Systemic lupus ([place]) 09/02/2014  . Hyperlipidemia 09/02/2014  . Hypothyroidism 04/09/2013  . Depression 04/09/2013  . GAD (generalized anxiety disorder) 04/09/2013    [place], [place] MPT 12/12/2016, 3:45 PM  [facility] [address] [place], [place], [address] Phone: [phone]   Fax:  [phone]  Name: Kimberly Roth MRN: [id number] Date of Birth: 1946-07-03

## 2016-12-14 ENCOUNTER — Ambulatory Visit: Payer: Worker's Compensation | Admitting: Physical Therapy

## 2016-12-14 ENCOUNTER — Encounter: Payer: Self-pay | Admitting: Physical Therapy

## 2016-12-14 DIAGNOSIS — M25511 Pain in right shoulder: Secondary | ICD-10-CM | POA: Diagnosis not present

## 2016-12-14 DIAGNOSIS — M25612 Stiffness of left shoulder, not elsewhere classified: Secondary | ICD-10-CM

## 2016-12-14 DIAGNOSIS — M25611 Stiffness of right shoulder, not elsewhere classified: Secondary | ICD-10-CM

## 2016-12-14 DIAGNOSIS — G8929 Other chronic pain: Secondary | ICD-10-CM

## 2016-12-14 DIAGNOSIS — M25512 Pain in left shoulder: Secondary | ICD-10-CM

## 2016-12-14 NOTE — Therapy (Signed)
[facility] [place], [place], [address] Phone: [phone]   Fax:  [phone]  Physical Therapy Treatment  Patient Details  Name: Kimberly Roth MRN: [id number] Date of Birth: 1945/10/13 Referring Provider: Rodell Perna MD.  Encounter Date: 12/14/2016      PT End of Session - 12/14/16 1518    Visit Number 24   Number of Visits 24   Date for PT Re-Evaluation 12/08/16   PT Start Time 6962   PT Stop Time 9528   PT Time Calculation (min) 45 min   Activity Tolerance Patient limited by pain;Patient tolerated treatment well   Behavior During Therapy [facility] for tasks assessed/performed      Past Medical History:  Diagnosis Date  . Anxiety   . Breast cancer ([place])    breast bilateral  . Cataract   . History of shingles   . Hyperlipidemia   . PTSD (post-traumatic stress disorder)    related to childhood trauma  . Systemic lupus ([place])   . Uterine cancer [facility])     Past Surgical History:  Procedure Laterality Date  . ABDOMINAL HYSTERECTOMY  1976   no oopherectomy  . APPENDECTOMY    . BACK SURGERY    . BREAST IMPLANT REMOVAL Bilateral    Silicone allergy  . BREAST SURGERY Bilateral 1991   Mastectomy  . CATARACT EXTRACTION W/PHACO Right 10/30/2013   Procedure: CATARACT EXTRACTION PHACO AND INTRAOCULAR LENS PLACEMENT (IOC);  Surgeon: Tonny Branch, MD;  Location: [facility];  Service: Ophthalmology;  Laterality: Right;  CDE:10.13  . CATARACT EXTRACTION W/PHACO Left 11/10/2013   Procedure: CATARACT EXTRACTION PHACO AND INTRAOCULAR LENS PLACEMENT (IOC);  Surgeon: Tonny Branch, MD;  Location: [facility];  Service: Ophthalmology;  Laterality: Left;  CDE 9.71  . DILATION AND CURETTAGE OF UTERUS    . EYE SURGERY Bilateral 2015   Cataract - Dr Geoffry Paradise in [place]  . JOINT REPLACEMENT Left 2005   Knee x 2  . MASTECTOMY Bilateral   . PLACEMENT OF BREAST IMPLANTS Bilateral    Secondary to cancer  . RECONSTRUCTION / CORRECTION OF NIPPLE / AEROLA  Bilateral   . TONSILLECTOMY    . WISDOM TOOTH EXTRACTION Bilateral    Extraction x 4  . WRIST SURGERY Right 20's   patient not exactly sure of reason    There were no vitals filed for this visit.      Subjective Assessment - 12/14/16 1452    Subjective Patient feels like she has less spasms overall and has been doing HEP daily   Patient Stated Goals Regain use of right UE.   Currently in Pain? Yes   Pain Score 5    Pain Location Shoulder   Pain Orientation Right   Pain Descriptors / Indicators Sore   Pain Type Surgical pain   Pain Onset More than a month ago   Pain Frequency Constant   Aggravating Factors  certain movements   Pain Relieving Factors at rest                         [facility] Adult PT Treatment/Exercise - 12/14/16 0001      Shoulder Exercises: Standing   Other Standing Exercises standing RW4 x5-10 each way with yellow t-band , slow movements and half range     Shoulder Exercises: Pulleys   Flexion Other (comment)  70min   Other Pulley Exercises wall ladder 66min   Other Pulley Exercises standing UE ranger for circles  and elevation 2x10     Moist Heat Therapy   Number Minutes Moist Heat 15 Minutes   Moist Heat Location Shoulder  small pack     Electrical Stimulation   Electrical Stimulation Location Right shoulder.   Electrical Stimulation Action premod   Electrical Stimulation Parameters 80-150hz  x54min   Electrical Stimulation Goals Pain                     PT Long Term Goals - 10/30/16 1440      PT LONG TERM GOAL #1   Title Independent with an HEP.   Time 4   Period Weeks   Status On-going     PT LONG TERM GOAL #2   Title Active right shoulder flexion to 145 degrees so the patient can easily reach overhead.   Time 4   Period Weeks   Status On-going     PT LONG TERM GOAL #3   Title Active ER to 70 degrees+ to allow for easily donning/doffing of apparel   Time 4   Period Weeks   Status On-going     PT LONG  TERM GOAL #4   Title Full right elbow extension.   Time 4   Period Weeks   Status Achieved  10/30/16     PT LONG TERM GOAL #5   Title Perform ADL's with pain not > 3/10.   Time 4   Period Weeks   Status On-going               Plan - 12/14/16 1519    Clinical Impression Statement Patient tolerated treatment well with some limitations due to ongoing pain and spasms with certain movements. Patient able to progress with strengthening exercises today. Patient limited with full active ROM due to strength and pain deficts.   Rehab Potential Good   Clinical Impairments Affecting Rehab Potential surgery 08/07/16 - 15 weeks current 11/19/16 ( 09/11/16 eval 10 weeks post eval 11/20/16)   PT Frequency 2x / week   PT Duration 4 weeks   PT Treatment/Interventions ADLs/Self Care Home Management;Electrical Stimulation;Moist Heat;Ultrasound;Patient/family education;Therapeutic exercise;Therapeutic activities;Passive range of motion   PT Next Visit Plan cont with POC assess RW4 exercise and may consider deloading eccentric contol exercise   Consulted and Agree with Plan of Care Patient      Patient will benefit from skilled therapeutic intervention in order to improve the following deficits and impairments:  Pain, Decreased activity tolerance, Decreased range of motion  Visit Diagnosis: Chronic right shoulder pain  Stiffness of right shoulder, not elsewhere classified  Acute pain of left shoulder  Stiffness of left shoulder, not elsewhere classified     Problem List Patient Active Problem List   Diagnosis Date Noted  . Traumatic partial tear of right biceps tendon 06/29/2016  . Spondylosis of cervical region without myelopathy or radiculopathy 06/29/2016  . Systemic lupus ([place]) 09/02/2014  . Hyperlipidemia 09/02/2014  . Hypothyroidism 04/09/2013  . Depression 04/09/2013  . GAD (generalized anxiety disorder) 04/09/2013    Phillips Climes, PTA 12/14/2016, 3:34 PM  [facility] [place], [place], [address] Phone: [phone]   Fax:  [phone]  Name: CRISTIN PENAFLOR MRN: [id number] Date of Birth: 01/12/1946

## 2016-12-18 ENCOUNTER — Telehealth (INDEPENDENT_AMBULATORY_CARE_PROVIDER_SITE_OTHER): Payer: Self-pay | Admitting: Orthopaedic Surgery

## 2016-12-18 DIAGNOSIS — S4991XS Unspecified injury of right shoulder and upper arm, sequela: Secondary | ICD-10-CM

## 2016-12-18 MED ORDER — TRAMADOL HCL 50 MG PO TABS: ORAL_TABLET | ORAL | 0 refills | Status: DC

## 2016-12-18 NOTE — Telephone Encounter (Signed)
Patient called advised the pharmacy sent in a request to refill a Rx for Tramadol CL 50. Patient said she also called on Friday. The number to the pharmacy is [phone].    The name of the drug store is   [facility]. The number to contact patient is [phone]

## 2016-12-18 NOTE — Telephone Encounter (Signed)
I spoke with patient to advise. She is extremely upset and feels that she has to go through the ringer to get her medication. She states that [facility] has faxed 2 requests for this med this time and that she had the same trouble last time. She wants to know if she can get this medication on an automatic refill.  She is hurting and does not want to continue going through this.  I did advise that I would follow up on the fax machine issue and see if this seems to be happening with any other pharmacy in particular. I also advised patient that she could call here and leave a message with whomever answers the phone for me to call her. I also gave her the phone number to my station. I did explain, however, that if I am working with a different provider or not in the office, I will not be at that station to get the message.

## 2016-12-18 NOTE — Telephone Encounter (Signed)
Patient is upset that she continues to have delay in getting her medications. She is hurting. She would like to know if she can get this on automatic refill?   Please see below in regards to our conversation.  Thanks.

## 2016-12-18 NOTE — Telephone Encounter (Signed)
Please advise. I have not received message from pharmacy or patient.

## 2016-12-18 NOTE — Telephone Encounter (Signed)
Ok refill thanks 

## 2016-12-19 ENCOUNTER — Ambulatory Visit: Payer: Worker's Compensation | Admitting: Physical Therapy

## 2016-12-19 DIAGNOSIS — M25511 Pain in right shoulder: Principal | ICD-10-CM

## 2016-12-19 DIAGNOSIS — M25612 Stiffness of left shoulder, not elsewhere classified: Secondary | ICD-10-CM

## 2016-12-19 DIAGNOSIS — M25611 Stiffness of right shoulder, not elsewhere classified: Secondary | ICD-10-CM

## 2016-12-19 DIAGNOSIS — M25512 Pain in left shoulder: Secondary | ICD-10-CM

## 2016-12-19 DIAGNOSIS — G8929 Other chronic pain: Secondary | ICD-10-CM

## 2016-12-19 NOTE — Therapy (Signed)
[facility] [place], [place], [address] Phone: [phone]   Fax:  [phone]  Physical Therapy Treatment  Patient Details  Name: Kimberly Roth MRN: [id number] Date of Birth: Aug 14, 1945 Referring Provider: Rodell Perna MD.  Encounter Date: 12/19/2016      PT End of Session - 12/19/16 1732    Visit Number 25   Number of Visits 24   Date for PT Re-Evaluation 12/08/16   PT Start Time 0319   PT Stop Time 0404   PT Time Calculation (min) 45 min   Activity Tolerance Patient limited by pain;Patient tolerated treatment well   Behavior During Therapy [facility] for tasks assessed/performed      Past Medical History:  Diagnosis Date  . Anxiety   . Breast cancer ([place])    breast bilateral  . Cataract   . History of shingles   . Hyperlipidemia   . PTSD (post-traumatic stress disorder)    related to childhood trauma  . Systemic lupus ([place])   . Uterine cancer [facility])     Past Surgical History:  Procedure Laterality Date  . ABDOMINAL HYSTERECTOMY  1976   no oopherectomy  . APPENDECTOMY    . BACK SURGERY    . BREAST IMPLANT REMOVAL Bilateral    Silicone allergy  . BREAST SURGERY Bilateral 1991   Mastectomy  . CATARACT EXTRACTION W/PHACO Right 10/30/2013   Procedure: CATARACT EXTRACTION PHACO AND INTRAOCULAR LENS PLACEMENT (IOC);  Surgeon: Tonny Branch, MD;  Location: [facility];  Service: Ophthalmology;  Laterality: Right;  CDE:10.13  . CATARACT EXTRACTION W/PHACO Left 11/10/2013   Procedure: CATARACT EXTRACTION PHACO AND INTRAOCULAR LENS PLACEMENT (IOC);  Surgeon: Tonny Branch, MD;  Location: [facility];  Service: Ophthalmology;  Laterality: Left;  CDE 9.71  . DILATION AND CURETTAGE OF UTERUS    . EYE SURGERY Bilateral 2015   Cataract - Dr Geoffry Paradise in [place]  . JOINT REPLACEMENT Left 2005   Knee x 2  . MASTECTOMY Bilateral   . PLACEMENT OF BREAST IMPLANTS Bilateral    Secondary to cancer  . RECONSTRUCTION / CORRECTION OF NIPPLE / AEROLA  Bilateral   . TONSILLECTOMY    . WISDOM TOOTH EXTRACTION Bilateral    Extraction x 4  . WRIST SURGERY Right 20's   patient not exactly sure of reason    There were no vitals filed for this visit.      Subjective Assessment - 12/19/16 1749    Subjective I didn't like those band exercises.   Patient Stated Goals Regain use of right UE.   Pain Score 5    Pain Location Shoulder   Pain Orientation Right   Pain Descriptors / Indicators Sore   Pain Type Surgical pain   Pain Onset More than a month ago                         [facility] Adult PT Treatment/Exercise - 12/19/16 0001      Exercises   Exercises Shoulder     Shoulder Exercises: Pulleys   Flexion Limitations 5 minutes.     Shoulder Exercises: ROM/Strengthening   UBE (Upper Arm Bike) 8 minutes at 120 RPM's.     Acupuncturist Location Right anterior shoulder.   Electrical Stimulation Action Pre-mod   Electrical Stimulation Parameters 80-150 Hz x 15 minutes.   Electrical Stimulation Goals Pain     Manual Therapy   Manual Therapy Passive ROM   Passive  ROM Gentle PAROM into right shoulder flexion and ER low load long duration stretching x 10 minutes.                     PT Long Term Goals - 10/30/16 1440      PT LONG TERM GOAL #1   Title Independent with an HEP.   Time 4   Period Weeks   Status On-going     PT LONG TERM GOAL #2   Title Active right shoulder flexion to 145 degrees so the patient can easily reach overhead.   Time 4   Period Weeks   Status On-going     PT LONG TERM GOAL #3   Title Active ER to 70 degrees+ to allow for easily donning/doffing of apparel   Time 4   Period Weeks   Status On-going     PT LONG TERM GOAL #4   Title Full right elbow extension.   Time 4   Period Weeks   Status Achieved  10/30/16     PT LONG TERM GOAL #5   Title Perform ADL's with pain not > 3/10.   Time 4   Period Weeks   Status On-going                Plan - 12/19/16 1756    Clinical Impression Statement The patient is still very protective of her right shouler and hold her right hand against her abdomen.  Even gentle rom in supine can still produce what appears severe pain ("muscle spasms.") She was able to achieve passive right shoulder flexion to 155 degrees while performing the pulleys and ER to 70 degrees in supine.      Patient will benefit from skilled therapeutic intervention in order to improve the following deficits and impairments:  Pain, Decreased activity tolerance, Decreased range of motion  Visit Diagnosis: Chronic right shoulder pain  Stiffness of right shoulder, not elsewhere classified  Acute pain of left shoulder  Stiffness of left shoulder, not elsewhere classified     Problem List Patient Active Problem List   Diagnosis Date Noted  . Traumatic partial tear of right biceps tendon 06/29/2016  . Spondylosis of cervical region without myelopathy or radiculopathy 06/29/2016  . Systemic lupus ([place]) 09/02/2014  . Hyperlipidemia 09/02/2014  . Hypothyroidism 04/09/2013  . Depression 04/09/2013  . GAD (generalized anxiety disorder) 04/09/2013    Boni Maclellan, [place]  MPT 12/19/2016, 5:59 PM  [facility] [address] [place], [place], [address] Phone: [phone]   Fax:  [phone]  Name: Kimberly Roth MRN: [id number] Date of Birth: [birth year]

## 2016-12-19 NOTE — Telephone Encounter (Signed)
noted 

## 2016-12-19 NOTE — Telephone Encounter (Signed)
See previous message I called her yesterday at about 5 PM.  Previous message OK'ed refill. No she cannot have automatic refills.  thanks

## 2016-12-21 ENCOUNTER — Encounter: Payer: Self-pay | Admitting: Physical Therapy

## 2016-12-21 ENCOUNTER — Telehealth (INDEPENDENT_AMBULATORY_CARE_PROVIDER_SITE_OTHER): Payer: Self-pay | Admitting: Radiology

## 2016-12-21 ENCOUNTER — Ambulatory Visit: Payer: Worker's Compensation | Admitting: Physical Therapy

## 2016-12-21 ENCOUNTER — Ambulatory Visit: Payer: Medicare Other | Admitting: Physical Therapy

## 2016-12-21 DIAGNOSIS — M25611 Stiffness of right shoulder, not elsewhere classified: Secondary | ICD-10-CM

## 2016-12-21 DIAGNOSIS — M25511 Pain in right shoulder: Secondary | ICD-10-CM | POA: Diagnosis not present

## 2016-12-21 DIAGNOSIS — G8929 Other chronic pain: Secondary | ICD-10-CM

## 2016-12-21 NOTE — Therapy (Signed)
[facility] [place], [place], [address] Phone: [phone]   Fax:  [phone]  Physical Therapy Treatment  Patient Details  Name: Kimberly Roth MRN: [id number] Date of Birth: Jun 01, 1946 Referring Provider: Rodell Perna MD.  Encounter Date: 12/21/2016      PT End of Session - 12/21/16 1445    Visit Number 26   Number of Visits 24   Date for PT Re-Evaluation 12/08/16   PT Start Time 7253   PT Stop Time 1525   PT Time Calculation (min) 51 min   Activity Tolerance Patient limited by pain;Patient tolerated treatment well   Behavior During Therapy [facility] for tasks assessed/performed      Past Medical History:  Diagnosis Date  . Anxiety   . Breast cancer ([place])    breast bilateral  . Cataract   . History of shingles   . Hyperlipidemia   . PTSD (post-traumatic stress disorder)    related to childhood trauma  . Systemic lupus ([place])   . Uterine cancer [facility])     Past Surgical History:  Procedure Laterality Date  . ABDOMINAL HYSTERECTOMY  1976   no oopherectomy  . APPENDECTOMY    . BACK SURGERY    . BREAST IMPLANT REMOVAL Bilateral    Silicone allergy  . BREAST SURGERY Bilateral 1991   Mastectomy  . CATARACT EXTRACTION W/PHACO Right 10/30/2013   Procedure: CATARACT EXTRACTION PHACO AND INTRAOCULAR LENS PLACEMENT (IOC);  Surgeon: Tonny Branch, MD;  Location: [facility];  Service: Ophthalmology;  Laterality: Right;  CDE:10.13  . CATARACT EXTRACTION W/PHACO Left 11/10/2013   Procedure: CATARACT EXTRACTION PHACO AND INTRAOCULAR LENS PLACEMENT (IOC);  Surgeon: Tonny Branch, MD;  Location: [facility];  Service: Ophthalmology;  Laterality: Left;  CDE 9.71  . DILATION AND CURETTAGE OF UTERUS    . EYE SURGERY Bilateral 2015   Cataract - Dr Geoffry Paradise in [place]  . JOINT REPLACEMENT Left 2005   Knee x 2  . MASTECTOMY Bilateral   . PLACEMENT OF BREAST IMPLANTS Bilateral    Secondary to cancer  . RECONSTRUCTION / CORRECTION OF NIPPLE / AEROLA  Bilateral   . TONSILLECTOMY    . WISDOM TOOTH EXTRACTION Bilateral    Extraction x 4  . WRIST SURGERY Right 20's   patient not exactly sure of reason    There were no vitals filed for this visit.      Subjective Assessment - 12/21/16 1444    Subjective Reports that following last treatment she had to go back to using sling.   Patient Stated Goals Regain use of right UE.   Currently in Pain? Other (Comment)  Patient did not provide pain assessment today            [facility] PT Assessment - 12/21/16 0001      Assessment   Medical Diagnosis Traumatic tear of right biceps tendon.   Onset Date/Surgical Date 08/07/16     Restrictions   Weight Bearing Restrictions No                     [facility] Adult PT Treatment/Exercise - 12/21/16 0001      Shoulder Exercises: Pulleys   Flexion Other (comment)  x5 min   Other Pulley Exercises Wall ladder x6 min   Other Pulley Exercises Standing UE circles x20 reps   Very slow pace     Shoulder Exercises: ROM/Strengthening   UBE (Upper Arm Bike) 120 RPM x5 min with extremely slow pace  Modalities   Modalities Electrical Stimulation;Moist Heat     Moist Heat Therapy   Number Minutes Moist Heat 15 Minutes   Moist Heat Location Shoulder     Electrical Stimulation   Electrical Stimulation Location R shoulder   Electrical Stimulation Action Pre-Mod   Electrical Stimulation Parameters 80-150 hz x15 min   Electrical Stimulation Goals Pain     Manual Therapy   Manual Therapy Passive ROM   Passive ROM Gentle PROM of R shoulder into flex/ER to promote ROM                     PT Long Term Goals - 10/30/16 1440      PT LONG TERM GOAL #1   Title Independent with an HEP.   Time 4   Period Weeks   Status On-going     PT LONG TERM GOAL #2   Title Active right shoulder flexion to 145 degrees so the patient can easily reach overhead.   Time 4   Period Weeks   Status On-going     PT LONG TERM GOAL #3    Title Active ER to 70 degrees+ to allow for easily donning/doffing of apparel   Time 4   Period Weeks   Status On-going     PT LONG TERM GOAL #4   Title Full right elbow extension.   Time 4   Period Weeks   Status Achieved  10/30/16     PT LONG TERM GOAL #5   Title Perform ADL's with pain not > 3/10.   Time 4   Period Weeks   Status On-going               Plan - 12/21/16 1512    Clinical Impression Statement Patient is very protective and very guarded regarding R shoulder. Patient completes all exercises in extremely slow pace. Patient continues to report muscle spasms during very gentle PROM of R shoulder into flexion and ER and was extremely guarded throughout the session. Normal modalities response noted following removal of the modalities.   Rehab Potential Good   Clinical Impairments Affecting Rehab Potential surgery 08/07/16 - 15 weeks current 11/19/16 ( 09/11/16 eval 10 weeks post eval 11/20/16)   PT Frequency 2x / week   PT Duration 4 weeks   PT Treatment/Interventions ADLs/Self Care Home Management;Electrical Stimulation;Moist Heat;Ultrasound;Patient/family education;Therapeutic exercise;Therapeutic activities;Passive range of motion   PT Next Visit Plan cont with POC assess RW4 exercise and may consider deloading eccentric contol exercise   PT Home Exercise Plan scap retraction and depression   Consulted and Agree with Plan of Care Patient      Patient will benefit from skilled therapeutic intervention in order to improve the following deficits and impairments:  Pain, Decreased activity tolerance, Decreased range of motion  Visit Diagnosis: Chronic right shoulder pain  Stiffness of right shoulder, not elsewhere classified     Problem List Patient Active Problem List   Diagnosis Date Noted  . Traumatic partial tear of right biceps tendon 06/29/2016  . Spondylosis of cervical region without myelopathy or radiculopathy 06/29/2016  . Systemic lupus ([place])  09/02/2014  . Hyperlipidemia 09/02/2014  . Hypothyroidism 04/09/2013  . Depression 04/09/2013  . GAD (generalized anxiety disorder) 04/09/2013    Wynelle Fanny, PTA 12/21/2016, 3:56 PM  [facility] [place], [place], [address] Phone: [phone]   Fax:  [phone]  Name: Kimberly Roth MRN: [id number] Date of Birth: [birth year]

## 2016-12-21 NOTE — Telephone Encounter (Signed)
Patient would like to know if she can take her pain medication twice a day instead of at night only? She states that she is doing PT and it really hurts.   Please advise. [phone]

## 2016-12-21 NOTE — Telephone Encounter (Signed)
Request OP Note be faxed to [phone]. Patient is having therapy there.  Abigail Butts pulled report in computer, faxed.

## 2016-12-21 NOTE — Telephone Encounter (Signed)
I called and left message

## 2016-12-26 ENCOUNTER — Encounter: Payer: Self-pay | Admitting: Physical Therapy

## 2016-12-26 ENCOUNTER — Ambulatory Visit: Payer: Worker's Compensation | Admitting: Physical Therapy

## 2016-12-26 ENCOUNTER — Ambulatory Visit: Payer: Medicare Other | Admitting: Physical Therapy

## 2016-12-26 DIAGNOSIS — G8929 Other chronic pain: Secondary | ICD-10-CM

## 2016-12-26 DIAGNOSIS — M25511 Pain in right shoulder: Principal | ICD-10-CM

## 2016-12-26 DIAGNOSIS — M25611 Stiffness of right shoulder, not elsewhere classified: Secondary | ICD-10-CM

## 2016-12-26 DIAGNOSIS — H04123 Dry eye syndrome of bilateral lacrimal glands: Secondary | ICD-10-CM | POA: Diagnosis not present

## 2016-12-26 NOTE — Therapy (Signed)
[facility] [place], [place], [address] Phone: [phone]   Fax:  [phone]  Physical Therapy Treatment  Patient Details  Name: Kimberly Roth MRN: [id number] Date of Birth: 1945/08/13 Referring Provider: Rodell Perna MD.  Encounter Date: 12/26/2016      PT End of Session - 12/26/16 1434    Visit Number 26   Number of Visits 24   Date for PT Re-Evaluation 12/08/16   PT Start Time 1430   PT Stop Time 1520   PT Time Calculation (min) 50 min   Activity Tolerance Patient limited by pain;Patient tolerated treatment well   Behavior During Therapy [facility] for tasks assessed/performed      Past Medical History:  Diagnosis Date  . Anxiety   . Breast cancer ([place])    breast bilateral  . Cataract   . History of shingles   . Hyperlipidemia   . PTSD (post-traumatic stress disorder)    related to childhood trauma  . Systemic lupus ([place])   . Uterine cancer [facility])     Past Surgical History:  Procedure Laterality Date  . ABDOMINAL HYSTERECTOMY  1976   no oopherectomy  . APPENDECTOMY    . BACK SURGERY    . BREAST IMPLANT REMOVAL Bilateral    Silicone allergy  . BREAST SURGERY Bilateral 1991   Mastectomy  . CATARACT EXTRACTION W/PHACO Right 10/30/2013   Procedure: CATARACT EXTRACTION PHACO AND INTRAOCULAR LENS PLACEMENT (IOC);  Surgeon: Tonny Branch, MD;  Location: [facility];  Service: Ophthalmology;  Laterality: Right;  CDE:10.13  . CATARACT EXTRACTION W/PHACO Left 11/10/2013   Procedure: CATARACT EXTRACTION PHACO AND INTRAOCULAR LENS PLACEMENT (IOC);  Surgeon: Tonny Branch, MD;  Location: [facility];  Service: Ophthalmology;  Laterality: Left;  CDE 9.71  . DILATION AND CURETTAGE OF UTERUS    . EYE SURGERY Bilateral 2015   Cataract - Dr Geoffry Paradise in [place]  . JOINT REPLACEMENT Left 2005   Knee x 2  . MASTECTOMY Bilateral   . PLACEMENT OF BREAST IMPLANTS Bilateral    Secondary to cancer  . RECONSTRUCTION / CORRECTION OF NIPPLE / AEROLA  Bilateral   . TONSILLECTOMY    . WISDOM TOOTH EXTRACTION Bilateral    Extraction x 4  . WRIST SURGERY Right 20's   patient not exactly sure of reason    There were no vitals filed for this visit.      Subjective Assessment - 12/26/16 1433    Subjective Reports that she has taken many medications prior to treatment and is about a 5/10. States that her MD said no to increasing dosage of Tramadol.   Patient Stated Goals Regain use of right UE.   Currently in Pain? Yes   Pain Score 5    Pain Location Shoulder   Pain Orientation Right   Pain Descriptors / Indicators Sharp   Pain Type Surgical pain   Pain Onset More than a month ago            [facility] PT Assessment - 12/26/16 0001      Assessment   Medical Diagnosis Traumatic tear of right biceps tendon.   Onset Date/Surgical Date 08/07/16   Next MD Visit 01/04/2017     Restrictions   Weight Bearing Restrictions No                     [facility] Adult PT Treatment/Exercise - 12/26/16 0001      Shoulder Exercises: Pulleys   Flexion Other (comment)  x7 min   Other Pulley Exercises Wall ladder x5 min     Shoulder Exercises: ROM/Strengthening   UBE (Upper Arm Bike) 120 RPM x6 min with extremely slow pace     Modalities   Modalities Electrical Stimulation;Moist Heat     Moist Heat Therapy   Number Minutes Moist Heat 15 Minutes   Moist Heat Location Shoulder     Electrical Stimulation   Electrical Stimulation Location R shoulder   Electrical Stimulation Action Pre-Mod   Electrical Stimulation Parameters 80-150 hz x15 min   Electrical Stimulation Goals Pain     Manual Therapy   Manual Therapy Passive ROM   Passive ROM Gentle PROM of R shoulder into flex/ER to promote ROM                     PT Long Term Goals - 10/30/16 1440      PT LONG TERM GOAL #1   Title Independent with an HEP.   Time 4   Period Weeks   Status On-going     PT LONG TERM GOAL #2   Title Active right shoulder flexion  to 145 degrees so the patient can easily reach overhead.   Time 4   Period Weeks   Status On-going     PT LONG TERM GOAL #3   Title Active ER to 70 degrees+ to allow for easily donning/doffing of apparel   Time 4   Period Weeks   Status On-going     PT LONG TERM GOAL #4   Title Full right elbow extension.   Time 4   Period Weeks   Status Achieved  10/30/16     PT LONG TERM GOAL #5   Title Perform ADL's with pain not > 3/10.   Time 4   Period Weeks   Status On-going               Plan - 12/26/16 1511    Clinical Impression Statement Patient remains protective of R shoulder still during treatment and she completed exercises with very slow pace and holds RUE against her abdomen if resting. Patient able to tolerate more ROM with exercises and with PROM today. Only one RUE spasm experienced by patient with PROM of R shoulder into ER. Normal modalities response noted following removal of the modalities.   Rehab Potential Good   Clinical Impairments Affecting Rehab Potential surgery 08/07/16 - 15 weeks current 11/19/16 ( 09/11/16 eval 10 weeks post eval 11/20/16)   PT Frequency 2x / week   PT Duration 4 weeks   PT Treatment/Interventions ADLs/Self Care Home Management;Electrical Stimulation;Moist Heat;Ultrasound;Patient/family education;Therapeutic exercise;Therapeutic activities;Passive range of motion   PT Next Visit Plan cont with POC assess RW4 exercise and may consider deloading eccentric contol exercise   PT Home Exercise Plan scap retraction and depression   Consulted and Agree with Plan of Care Patient      Patient will benefit from skilled therapeutic intervention in order to improve the following deficits and impairments:  Pain, Decreased activity tolerance, Decreased range of motion  Visit Diagnosis: Chronic right shoulder pain  Stiffness of right shoulder, not elsewhere classified     Problem List Patient Active Problem List   Diagnosis Date Noted  . Traumatic  partial tear of right biceps tendon 06/29/2016  . Spondylosis of cervical region without myelopathy or radiculopathy 06/29/2016  . Systemic lupus ([place]) 09/02/2014  . Hyperlipidemia 09/02/2014  . Hypothyroidism 04/09/2013  . Depression 04/09/2013  . GAD (generalized anxiety  disorder) 04/09/2013    Wynelle Fanny, PTA 12/26/2016, 4:04 PM  [facility] [address] [place], [place], [address] Phone: [phone]   Fax:  [phone]  Name: Kimberly Roth MRN: [id number] Date of Birth: 05/14/1946

## 2016-12-28 ENCOUNTER — Other Ambulatory Visit: Payer: Self-pay | Admitting: Nurse Practitioner

## 2016-12-28 ENCOUNTER — Encounter: Payer: Self-pay | Admitting: Physical Therapy

## 2016-12-28 ENCOUNTER — Ambulatory Visit: Payer: Worker's Compensation | Admitting: Physical Therapy

## 2016-12-28 DIAGNOSIS — M25511 Pain in right shoulder: Secondary | ICD-10-CM | POA: Diagnosis not present

## 2016-12-28 DIAGNOSIS — F32A Depression, unspecified: Secondary | ICD-10-CM

## 2016-12-28 DIAGNOSIS — M25611 Stiffness of right shoulder, not elsewhere classified: Secondary | ICD-10-CM

## 2016-12-28 DIAGNOSIS — G8929 Other chronic pain: Secondary | ICD-10-CM

## 2016-12-28 DIAGNOSIS — F329 Major depressive disorder, single episode, unspecified: Secondary | ICD-10-CM

## 2016-12-28 NOTE — Therapy (Signed)
[facility] [place], [place], [address] Phone: [phone]   Fax:  [phone]  Physical Therapy Treatment  Patient Details  Name: Kimberly Roth MRN: [id number] Date of Birth: 10/11/1945 Referring Provider: Rodell Perna MD.  Encounter Date: 12/28/2016      PT End of Session - 12/28/16 1432    Visit Number 27   Number of Visits 32   Date for PT Re-Evaluation 01/12/17   PT Start Time 3614   PT Stop Time 1521   PT Time Calculation (min) 52 min   Activity Tolerance Patient limited by pain;Patient tolerated treatment well   Behavior During Therapy [facility] for tasks assessed/performed      Past Medical History:  Diagnosis Date  . Anxiety   . Breast cancer ([place])    breast bilateral  . Cataract   . History of shingles   . Hyperlipidemia   . PTSD (post-traumatic stress disorder)    related to childhood trauma  . Systemic lupus ([place])   . Uterine cancer [facility])     Past Surgical History:  Procedure Laterality Date  . ABDOMINAL HYSTERECTOMY  1976   no oopherectomy  . APPENDECTOMY    . BACK SURGERY    . BREAST IMPLANT REMOVAL Bilateral    Silicone allergy  . BREAST SURGERY Bilateral 1991   Mastectomy  . CATARACT EXTRACTION W/PHACO Right 10/30/2013   Procedure: CATARACT EXTRACTION PHACO AND INTRAOCULAR LENS PLACEMENT (IOC);  Surgeon: Tonny Branch, MD;  Location: [facility];  Service: Ophthalmology;  Laterality: Right;  CDE:10.13  . CATARACT EXTRACTION W/PHACO Left 11/10/2013   Procedure: CATARACT EXTRACTION PHACO AND INTRAOCULAR LENS PLACEMENT (IOC);  Surgeon: Tonny Branch, MD;  Location: [facility];  Service: Ophthalmology;  Laterality: Left;  CDE 9.71  . DILATION AND CURETTAGE OF UTERUS    . EYE SURGERY Bilateral 2015   Cataract - Dr Geoffry Paradise in [place]  . JOINT REPLACEMENT Left 2005   Knee x 2  . MASTECTOMY Bilateral   . PLACEMENT OF BREAST IMPLANTS Bilateral    Secondary to cancer  . RECONSTRUCTION / CORRECTION OF NIPPLE / AEROLA  Bilateral   . TONSILLECTOMY    . WISDOM TOOTH EXTRACTION Bilateral    Extraction x 4  . WRIST SURGERY Right 20's   patient not exactly sure of reason    There were no vitals filed for this visit.      Subjective Assessment - 12/28/16 1431    Subjective Reports that her shoulder feels about the same today. Reports that she continues to complete exercises at home.   Patient Stated Goals Regain use of right UE.   Currently in Pain? Yes            [facility] PT Assessment - 12/28/16 0001      Assessment   Medical Diagnosis Traumatic tear of right biceps tendon.   Onset Date/Surgical Date 08/07/16   Next MD Visit 01/04/2017     Restrictions   Weight Bearing Restrictions No                     [facility] Adult PT Treatment/Exercise - 12/28/16 0001      Shoulder Exercises: Pulleys   Flexion Other (comment)  x5 min   Other Pulley Exercises Wall ladder x5 min   Other Pulley Exercises Standing UE circles x5 min     Shoulder Exercises: ROM/Strengthening   UBE (Upper Arm Bike) 120 RPM x5 min with extremely slow pace  Modalities   Modalities Electrical Stimulation;Moist Heat     Moist Heat Therapy   Number Minutes Moist Heat 15 Minutes   Moist Heat Location Shoulder     Electrical Stimulation   Electrical Stimulation Location R shoulder   Electrical Stimulation Action Pre-Mod   Electrical Stimulation Parameters 80-150 hz x15 min   Electrical Stimulation Goals Pain     Manual Therapy   Manual Therapy Passive ROM   Passive ROM Gentle PROM of R shoulder into flex/ER to promote ROM                     PT Long Term Goals - 10/30/16 1440      PT LONG TERM GOAL #1   Title Independent with an HEP.   Time 4   Period Weeks   Status On-going     PT LONG TERM GOAL #2   Title Active right shoulder flexion to 145 degrees so the patient can easily reach overhead.   Time 4   Period Weeks   Status On-going     PT LONG TERM GOAL #3   Title Active ER to  70 degrees+ to allow for easily donning/doffing of apparel   Time 4   Period Weeks   Status On-going     PT LONG TERM GOAL #4   Title Full right elbow extension.   Time 4   Period Weeks   Status Achieved  10/30/16     PT LONG TERM GOAL #5   Title Perform ADL's with pain not > 3/10.   Time 4   Period Weeks   Status On-going               Plan - 12/28/16 1509    Clinical Impression Statement Patient continues to present in clinic with R shoulder discomfort and muscle spasms intermittantly. Patient still very guarded with RUE and maintains RUE close to her abdomen between exercises. Patient completes exercises in a very slow pace and experienced intermittantly muscle spasms with UE ranger but also with PROM of R shoulder in both flexion and ER. Facial grimacing noted with removal of adhesive pads of electrical stimulation. Normal modalities response noted following removal of the modalities.   Rehab Potential Good   Clinical Impairments Affecting Rehab Potential surgery 08/07/16 - 15 weeks current 11/19/16 ( 09/11/16 eval 10 weeks post eval 11/20/16)   PT Frequency 2x / week   PT Duration 4 weeks   PT Treatment/Interventions ADLs/Self Care Home Management;Electrical Stimulation;Moist Heat;Ultrasound;Patient/family education;Therapeutic exercise;Therapeutic activities;Passive range of motion   PT Next Visit Plan cont with POC assess RW4 exercise and may consider deloading eccentric contol exercise   PT Home Exercise Plan scap retraction and depression   Consulted and Agree with Plan of Care Patient      Patient will benefit from skilled therapeutic intervention in order to improve the following deficits and impairments:  Pain, Decreased activity tolerance, Decreased range of motion  Visit Diagnosis: Chronic right shoulder pain  Stiffness of right shoulder, not elsewhere classified     Problem List Patient Active Problem List   Diagnosis Date Noted  . Traumatic partial tear  of right biceps tendon 06/29/2016  . Spondylosis of cervical region without myelopathy or radiculopathy 06/29/2016  . Systemic lupus ([place]) 09/02/2014  . Hyperlipidemia 09/02/2014  . Hypothyroidism 04/09/2013  . Depression 04/09/2013  . GAD (generalized anxiety disorder) 04/09/2013    Wynelle Fanny, PTA 12/28/2016, 3:44 PM  [facility] [address]  [place], [place], [address] Phone: [phone]   Fax:  [phone]  Name: Kimberly Roth MRN: [id number] Date of Birth: 02/08/1946

## 2016-12-29 ENCOUNTER — Encounter: Payer: Self-pay | Admitting: Nurse Practitioner

## 2016-12-29 ENCOUNTER — Ambulatory Visit (INDEPENDENT_AMBULATORY_CARE_PROVIDER_SITE_OTHER): Payer: Medicare Other | Admitting: Nurse Practitioner

## 2016-12-29 VITALS — BP 143/92 | HR 85 | Temp 97.4°F | Ht 64.0 in | Wt 133.0 lb

## 2016-12-29 DIAGNOSIS — F3342 Major depressive disorder, recurrent, in full remission: Secondary | ICD-10-CM

## 2016-12-29 DIAGNOSIS — F329 Major depressive disorder, single episode, unspecified: Secondary | ICD-10-CM | POA: Diagnosis not present

## 2016-12-29 DIAGNOSIS — R222 Localized swelling, mass and lump, trunk: Secondary | ICD-10-CM

## 2016-12-29 DIAGNOSIS — E7800 Pure hypercholesterolemia, unspecified: Secondary | ICD-10-CM | POA: Diagnosis not present

## 2016-12-29 DIAGNOSIS — R5383 Other fatigue: Secondary | ICD-10-CM | POA: Diagnosis not present

## 2016-12-29 DIAGNOSIS — F411 Generalized anxiety disorder: Secondary | ICD-10-CM | POA: Diagnosis not present

## 2016-12-29 DIAGNOSIS — E034 Atrophy of thyroid (acquired): Secondary | ICD-10-CM | POA: Diagnosis not present

## 2016-12-29 DIAGNOSIS — F32A Depression, unspecified: Secondary | ICD-10-CM

## 2016-12-29 MED ORDER — LEVOTHYROXINE SODIUM 175 MCG PO TABS: 175.0000 ug | ORAL_TABLET | Freq: Every morning | ORAL | 3 refills | Status: DC

## 2016-12-29 MED ORDER — BUPROPION HCL 100 MG PO TABS: 100.0000 mg | ORAL_TABLET | Freq: Three times a day (TID) | ORAL | 5 refills | Status: DC

## 2016-12-29 MED ORDER — CHLORDIAZEPOXIDE HCL 25 MG PO CAPS: ORAL_CAPSULE | ORAL | 1 refills | Status: DC

## 2016-12-29 NOTE — Addendum Note (Signed)
Addended by: [name] on: 12/29/2016 12:56 PM   Modules accepted: Orders

## 2016-12-29 NOTE — Progress Notes (Signed)
Subjective:    Patient ID: Kimberly Roth, female    DOB: September 01, 1945, 71 y.o.   MRN: [id number]  HPI  Kimberly Roth is here today for follow up of chronic medical problem.  Outpatient Encounter Prescriptions as of 12/29/2016  Medication Sig  . acetaminophen (TYLENOL) 325 MG tablet Take 650 mg by mouth every 4 (four) hours as needed.  . Ascorbic Acid (VITAMIN C) 1000 MG tablet Take 1,000 mg by mouth 2 (two) times daily.  Marland Kitchen aspirin 325 MG EC tablet Take 325 mg by mouth 2 (two) times daily.  Marland Kitchen BIOTIN PO Take 1 tablet by mouth 2 (two) times daily.  Marland Kitchen buPROPion (WELLBUTRIN) 100 MG tablet Take 1 tablet (100 mg total) by mouth 3 (three) times daily.  . calcium-vitamin D (OSCAL WITH D) 250-125 MG-UNIT per tablet Take 1 tablet by mouth 2 (two) times daily.  . chlordiazePOXIDE (LIBRIUM) 25 MG capsule TAKE 1 CAPSULE 3 TIMES DAILY AS NEEDED FOR ANXIETY  . cholecalciferol (VITAMIN D) 1000 UNITS tablet Take 1,000 Units by mouth 2 (two) times daily.  Marland Kitchen CRANBERRY EXTRACT PO Take 1 tablet by mouth 2 (two) times daily.  . diclofenac (VOLTAREN) 75 MG EC tablet Take 1 tablet (75 mg total) by mouth 2 (two) times daily. With food  . diphenhydrAMINE (BENADRYL) 25 MG tablet Take 50 mg by mouth at bedtime as needed for sleep.  Marland Kitchen ivermectin (STROMECTOL) 3 MG TABS tablet TAKE 1 TABLET EVERY MONTH  . levothyroxine (SYNTHROID, LEVOTHROID) 175 MCG tablet TAKE ONE TABLET EVERY MORNING  . Multiple Vitamins-Minerals (MULTIVITAMINS THER. W/MINERALS) TABS tablet Take 1 tablet by mouth 2 (two) times daily.  . multivitamin-lutein (OCUVITE-LUTEIN) CAPS capsule Take 1 capsule by mouth 2 (two) times daily.  . Omega-3 Fatty Acids (OMEGA 3 PO) Take 1 capsule by mouth 2 (two) times daily.  . Potassium Gluconate 595 MG CAPS Take 1 capsule by mouth daily.  . Thiamine HCl (VITAMIN B-1) 250 MG tablet Take 250 mg by mouth 2 (two) times daily.  . traMADol (ULTRAM) 50 MG tablet Take one po q HS     1. Hypothyroidism due to acquired  atrophy of thyroid   no problems  2. Pure hypercholesterolemia  Does not watch diet  3. GAD (generalized anxiety disorder)  wellbutrin helps  4. Recurrent major depressive disorder, in full remission ([place])  On wellbutrin- doing well- also pn librium from psych. Depression screen [facility] 2/9 12/29/2016 02/17/2016 01/31/2016 01/27/2016 09/02/2014  Decreased Interest 0 3 0 1 0  Down, Depressed, Hopeless 0 3 0 1 0  PHQ - 2 Score 0 6 0 2 0  Altered sleeping - 2 - 1 -  Tired, decreased energy - 2 - 0 -  Change in appetite - 1 - 0 -  Feeling bad or failure about yourself  - 0 - 0 -  Trouble concentrating - 1 - 0 -  Moving slowly or fidgety/restless - 0 - 0 -  Suicidal thoughts - 0 - 0 -  PHQ-9 Score - 12 - 3 -  Difficult doing work/chores - - - Somewhat difficult -       New complaints: -c/o feeling fatigued all the time- has no energy to do anything- denies any recent tick bite but does raise horses. - has history of breast cancer- had bil  Mastectomy with tram flap reconstruction- feels lump bil. denies any tenderness     Review of Systems  Constitutional: Negative for diaphoresis.  Eyes: Negative for pain.  Respiratory: Negative for shortness of breath.   Cardiovascular: Negative for chest pain, palpitations and leg swelling.  Gastrointestinal: Negative for abdominal pain.  Endocrine: Negative for polydipsia.  Skin: Negative for rash.  Neurological: Negative for dizziness, weakness and headaches.  Hematological: Does not bruise/bleed easily.       Objective:   Physical Exam  Constitutional: She is oriented to person, place, and time. She appears well-developed and well-nourished.  HENT:  Nose: Nose normal.  Mouth/Throat: Oropharynx is clear and moist.  Eyes: EOM are normal.  Neck: Trachea normal, normal range of motion and full passive range of motion without pain. Neck supple. No JVD present. Carotid bruit is not present. No thyromegaly present.  Cardiovascular: Normal rate,  regular rhythm, normal heart sounds and intact distal pulses.  Exam reveals no gallop and no friction rub.   No murmur heard. Pulmonary/Chest: Effort normal and breath sounds normal.    Has reconstructed breast from abdominal tram flap  Abdominal: Soft. Bowel sounds are normal. She exhibits no distension and no mass. There is no tenderness.  Musculoskeletal: Normal range of motion.  Right shoulder limited ROM due to pain  With abduction  Lymphadenopathy:    She has no cervical adenopathy.  Neurological: She is alert and oriented to person, place, and time. She has normal reflexes.  Skin: Skin is warm and dry.  Psychiatric: She has a normal mood and affect. Her behavior is normal. Judgment and thought content normal.   BP (!) 143/92   Pulse 85   Temp 97.4 F (36.3 C) (Oral)   Ht '5\' 4"'$  (1.626 m)   Wt 133 lb (60.3 kg)   BMI 22.83 kg/m         Assessment & Plan:  1. Hypothyroidism due to acquired atrophy of thyroid - levothyroxine (SYNTHROID, LEVOTHROID) 175 MCG tablet; Take 1 tablet (175 mcg total) by mouth every morning.  Dispense: 30 tablet; Refill: 3 - Thyroid Panel With TSH  2. Pure hypercholesterolemia Low fta diet - CMP14+EGFR - Lipid panel  3. GAD (generalized anxiety disorder) Stress management  4. Recurrent major depressive disorder, in full remission ([place]) - buPROPion (WELLBUTRIN) 100 MG tablet; Take 1 tablet (100 mg total) by mouth 3 (three) times daily.  Dispense: 90 tablet; Refill: 5  5. Mass of chest wall, bilateral - US BREAST BILATERAL  6. Fatigue, unspecified type - Anemia Profile B - Lyme Ab/Western Blot Reflex    Labs pending Health maintenance reviewed Diet and exercise encouraged Continue all meds Follow up  In 6 months   [place], FNP

## 2016-12-30 ENCOUNTER — Other Ambulatory Visit: Payer: Self-pay | Admitting: Nurse Practitioner

## 2016-12-30 MED ORDER — LEVOTHYROXINE SODIUM 150 MCG PO TABS: 150.0000 ug | ORAL_TABLET | Freq: Every day | ORAL | 3 refills | Status: DC

## 2017-01-02 ENCOUNTER — Ambulatory Visit: Payer: Worker's Compensation | Attending: Orthopaedic Surgery | Admitting: Physical Therapy

## 2017-01-02 ENCOUNTER — Encounter: Payer: Self-pay | Admitting: Physical Therapy

## 2017-01-02 DIAGNOSIS — M25612 Stiffness of left shoulder, not elsewhere classified: Secondary | ICD-10-CM | POA: Diagnosis present

## 2017-01-02 DIAGNOSIS — M25611 Stiffness of right shoulder, not elsewhere classified: Secondary | ICD-10-CM | POA: Insufficient documentation

## 2017-01-02 DIAGNOSIS — M25512 Pain in left shoulder: Secondary | ICD-10-CM | POA: Diagnosis present

## 2017-01-02 DIAGNOSIS — G8929 Other chronic pain: Secondary | ICD-10-CM | POA: Diagnosis present

## 2017-01-02 DIAGNOSIS — M25511 Pain in right shoulder: Secondary | ICD-10-CM | POA: Insufficient documentation

## 2017-01-02 LAB — THYROID PANEL WITH TSH
FREE THYROXINE INDEX: 4 (ref 1.2–4.9)
T3 Uptake Ratio: 34 % (ref 24–39)
T4, Total: 11.8 ug/dL (ref 4.5–12.0)
TSH: 0.012 u[IU]/mL — AB (ref 0.450–4.500)

## 2017-01-02 LAB — ANEMIA PROFILE B
BASOS ABS: 0 10*3/uL (ref 0.0–0.2)
Basos: 0 %
EOS (ABSOLUTE): 0.2 10*3/uL (ref 0.0–0.4)
EOS: 5 %
Ferritin: 42 ng/mL (ref 15–150)
Folate: >20 ng/mL (ref >3.0)
HEMOGLOBIN: 11.9 g/dL (ref 11.1–15.9)
Hematocrit: 34.7 % (ref 34.0–46.6)
IMMATURE GRANULOCYTES: 0 %
IRON SATURATION: 48 % (ref 15–55)
IRON: 129 ug/dL (ref 27–139)
Immature Grans (Abs): 0 10*3/uL (ref 0.0–0.1)
LYMPHS ABS: 1.9 10*3/uL (ref 0.7–3.1)
Lymphs: 46 %
MCH: 33.1 pg — AB (ref 26.6–33.0)
MCHC: 34.3 g/dL (ref 31.5–35.7)
MCV: 97 fL (ref 79–97)
MONOCYTES: 8 %
Monocytes Absolute: 0.3 10*3/uL (ref 0.1–0.9)
NEUTROS ABS: 1.6 10*3/uL (ref 1.4–7.0)
Neutrophils: 41 %
PLATELETS: 251 10*3/uL (ref 150–379)
RBC: 3.59 x10E6/uL — ABNORMAL LOW (ref 3.77–5.28)
RDW: 13.1 % (ref 12.3–15.4)
RETIC CT PCT: 0.7 % (ref 0.6–2.6)
TIBC: 267 ug/dL (ref 250–450)
UIBC: 138 ug/dL (ref 118–369)
VITAMIN B 12: 887 pg/mL (ref 232–1245)
WBC: 4 10*3/uL (ref 3.4–10.8)

## 2017-01-02 LAB — CMP14+EGFR
A/G RATIO: 2.3 — AB (ref 1.2–2.2)
ALK PHOS: 62 IU/L (ref 39–117)
ALT: 13 IU/L (ref 0–32)
AST: 20 IU/L (ref 0–40)
Albumin: 4.6 g/dL (ref 3.6–4.8)
BILIRUBIN TOTAL: 0.3 mg/dL (ref 0.0–1.2)
BUN/Creatinine Ratio: 13 (ref 12–28)
BUN: 10 mg/dL (ref 8–27)
CHLORIDE: 97 mmol/L (ref 96–106)
CO2: 25 mmol/L (ref 18–29)
Calcium: 9.8 mg/dL (ref 8.7–10.3)
Creatinine, Ser: 0.75 mg/dL (ref 0.57–1.00)
GFR calc non Af Amer: 82 mL/min/{1.73_m2} (ref >59)
GFR, EST AFRICAN AMERICAN: 94 mL/min/{1.73_m2} (ref >59)
GLUCOSE: 96 mg/dL (ref 65–99)
Globulin, Total: 2 g/dL (ref 1.5–4.5)
POTASSIUM: 4.7 mmol/L (ref 3.5–5.2)
Sodium: 137 mmol/L (ref 134–144)
TOTAL PROTEIN: 6.6 g/dL (ref 6.0–8.5)

## 2017-01-02 LAB — LIPID PANEL
CHOLESTEROL TOTAL: 220 mg/dL — AB (ref 100–199)
Chol/HDL Ratio: 2.8 ratio (ref 0.0–4.4)
HDL: 80 mg/dL (ref >39)
LDL Calculated: 122 mg/dL — ABNORMAL HIGH (ref 0–99)
TRIGLYCERIDES: 90 mg/dL (ref 0–149)
VLDL CHOLESTEROL CAL: 18 mg/dL (ref 5–40)

## 2017-01-02 LAB — LYME AB/WESTERN BLOT REFLEX

## 2017-01-02 NOTE — Therapy (Signed)
[facility] [place], [place], [address] Phone: [phone]   Fax:  [phone]  Physical Therapy Treatment  Patient Details  Name: Kimberly Roth MRN: [id number] Date of Birth: 01-27-1946 Referring Provider: Rodell Perna MD.  Encounter Date: 01/02/2017      PT End of Session - 01/02/17 1519    Visit Number 28   Number of Visits 32   Date for PT Re-Evaluation 01/12/17   PT Start Time 9024   PT Stop Time 0973   PT Time Calculation (min) 53 min   Activity Tolerance Patient limited by pain;Patient tolerated treatment well   Behavior During Therapy [facility] for tasks assessed/performed      Past Medical History:  Diagnosis Date  . Anxiety   . Breast cancer ([place])    breast bilateral  . Cataract   . History of shingles   . Hyperlipidemia   . PTSD (post-traumatic stress disorder)    related to childhood trauma  . Systemic lupus ([place])   . Uterine cancer [facility])     Past Surgical History:  Procedure Laterality Date  . ABDOMINAL HYSTERECTOMY  1976   no oopherectomy  . APPENDECTOMY    . BACK SURGERY    . BREAST IMPLANT REMOVAL Bilateral    Silicone allergy  . BREAST SURGERY Bilateral 1991   Mastectomy  . CATARACT EXTRACTION W/PHACO Right 10/30/2013   Procedure: CATARACT EXTRACTION PHACO AND INTRAOCULAR LENS PLACEMENT (IOC);  Surgeon: Tonny Branch, MD;  Location: [facility];  Service: Ophthalmology;  Laterality: Right;  CDE:10.13  . CATARACT EXTRACTION W/PHACO Left 11/10/2013   Procedure: CATARACT EXTRACTION PHACO AND INTRAOCULAR LENS PLACEMENT (IOC);  Surgeon: Tonny Branch, MD;  Location: [facility];  Service: Ophthalmology;  Laterality: Left;  CDE 9.71  . DILATION AND CURETTAGE OF UTERUS    . EYE SURGERY Bilateral 2015   Cataract - Dr Geoffry Paradise in [place]  . JOINT REPLACEMENT Left 2005   Knee x 2  . MASTECTOMY Bilateral   . PLACEMENT OF BREAST IMPLANTS Bilateral    Secondary to cancer  . RECONSTRUCTION / CORRECTION OF NIPPLE / AEROLA  Bilateral   . TONSILLECTOMY    . WISDOM TOOTH EXTRACTION Bilateral    Extraction x 4  . WRIST SURGERY Right 20's   patient not exactly sure of reason    There were no vitals filed for this visit.      Subjective Assessment - 01/02/17 1515    Subjective Reports that she has been doing a lot of pulley and wall ladder and has used a can of soup in a bag for bicep curls. Reports that two cans in bag did not work too well.   Patient Stated Goals Regain use of right UE.   Currently in Pain? Yes   Pain Score 4    Pain Location Shoulder   Pain Orientation Right   Pain Descriptors / Indicators Tingling;Spasm   Pain Type Surgical pain   Pain Onset More than a month ago            [facility] PT Assessment - 01/02/17 0001      Assessment   Medical Diagnosis Traumatic tear of right biceps tendon.   Onset Date/Surgical Date 08/07/16   Next MD Visit 01/04/2017     Restrictions   Weight Bearing Restrictions No     ROM / Strength   AROM / PROM / Strength AROM     AROM   Overall AROM  Deficits   AROM Assessment  Site Shoulder   Right/Left Shoulder Right   Right Shoulder Flexion 110 Degrees   Right Shoulder External Rotation 48 Degrees                     [facility] Adult PT Treatment/Exercise - 01/02/17 0001      Exercises   Exercises Shoulder;Elbow     Elbow Exercises   Elbow Flexion Strengthening;Right;20 reps;Standing;Bar weights/barbell   Bar Weights/Barbell (Elbow Flexion) 1 lb   Elbow Flexion Limitations Pain in posterior R elbow per patient report     Shoulder Exercises: Pulleys   Flexion Other (comment)  x8 min   Other Pulley Exercises Wall ladder x2 reps but stopped secondary to patient reporting spasm in R posterior elbow     Shoulder Exercises: ROM/Strengthening   UBE (Upper Arm Bike) 120 RPM x5 min with extremely slow pace     Modalities   Modalities Electrical Stimulation;Moist Heat     Moist Heat Therapy   Number Minutes Moist Heat 15 Minutes    Moist Heat Location Shoulder     Electrical Stimulation   Electrical Stimulation Location R shoulder   Electrical Stimulation Action Pre-Mod   Electrical Stimulation Parameters 80-150 hz x15 min   Electrical Stimulation Goals Pain     Manual Therapy   Manual Therapy Passive ROM   Passive ROM Gentle PROM of R shoulder into flex/ER to promote ROM                     PT Long Term Goals - 10/30/16 1440      PT LONG TERM GOAL #1   Title Independent with an HEP.   Time 4   Period Weeks   Status On-going     PT LONG TERM GOAL #2   Title Active right shoulder flexion to 145 degrees so the patient can easily reach overhead.   Time 4   Period Weeks   Status On-going     PT LONG TERM GOAL #3   Title Active ER to 70 degrees+ to allow for easily donning/doffing of apparel   Time 4   Period Weeks   Status On-going     PT LONG TERM GOAL #4   Title Full right elbow extension.   Time 4   Period Weeks   Status Achieved  10/30/16     PT LONG TERM GOAL #5   Title Perform ADL's with pain not > 3/10.   Time 4   Period Weeks   Status On-going               Plan - 01/02/17 1600    Clinical Impression Statement Patient continues to tolerate treatment fair as she is still very guarded and protective of RUE. Continues to complete all exercises in a very slow and guarded manner. Bicep curl completed with 1# weight but patient reported during exercise that the 1# weight was more than her can of soup at home that she had been using. Patient intermittantly reported spasm in R shoulder with exercises and reported pain in R inferior humerus/ elbow. PROM of R shoulder completed today with ROM approximated at more than AROM measurement measured in both directions. AROM of R shoulder flexion measured as 110 deg, ER 48 deg. Both PTA and Mali Applegate, MPT cautioned patient that ROM is very limited today.   Rehab Potential Good   Clinical Impairments Affecting Rehab Potential surgery  08/07/16 - 15 weeks current 11/19/16 ( 09/11/16 eval 10 weeks  post eval 11/20/16)   PT Frequency 2x / week   PT Duration 4 weeks   PT Treatment/Interventions ADLs/Self Care Home Management;Electrical Stimulation;Moist Heat;Ultrasound;Patient/family education;Therapeutic exercise;Therapeutic activities;Passive range of motion   PT Next Visit Plan cont with POC assess RW4 exercise and may consider deloading eccentric contol exercise   PT Home Exercise Plan scap retraction and depression   Consulted and Agree with Plan of Care Patient      Patient will benefit from skilled therapeutic intervention in order to improve the following deficits and impairments:  Pain, Decreased activity tolerance, Decreased range of motion  Visit Diagnosis: Chronic right shoulder pain  Stiffness of right shoulder, not elsewhere classified     Problem List Patient Active Problem List   Diagnosis Date Noted  . Traumatic partial tear of right biceps tendon 06/29/2016  . Spondylosis of cervical region without myelopathy or radiculopathy 06/29/2016  . Systemic lupus ([place]) 09/02/2014  . Hyperlipidemia 09/02/2014  . Hypothyroidism 04/09/2013  . Depression 04/09/2013  . GAD (generalized anxiety disorder) 04/09/2013    Ahmed Prima, PTA 01/02/17 4:39 PM Mali Applegate MPT [facility] [address] [place], [place], [address] Phone: [phone]   Fax:  [phone]  Name: Kimberly Roth MRN: [id number] Date of Birth: 10-12-1945

## 2017-01-04 ENCOUNTER — Ambulatory Visit (INDEPENDENT_AMBULATORY_CARE_PROVIDER_SITE_OTHER): Payer: Worker's Compensation | Admitting: Orthopaedic Surgery

## 2017-01-04 ENCOUNTER — Encounter (INDEPENDENT_AMBULATORY_CARE_PROVIDER_SITE_OTHER): Payer: Self-pay | Admitting: Orthopaedic Surgery

## 2017-01-04 ENCOUNTER — Ambulatory Visit: Payer: Worker's Compensation | Admitting: Physical Therapy

## 2017-01-04 VITALS — BP 149/81 | HR 83 | Ht 64.0 in | Wt 131.0 lb

## 2017-01-04 DIAGNOSIS — S46211S Strain of muscle, fascia and tendon of other parts of biceps, right arm, sequela: Secondary | ICD-10-CM

## 2017-01-04 DIAGNOSIS — M25512 Pain in left shoulder: Secondary | ICD-10-CM

## 2017-01-04 DIAGNOSIS — M25612 Stiffness of left shoulder, not elsewhere classified: Secondary | ICD-10-CM

## 2017-01-04 DIAGNOSIS — M25511 Pain in right shoulder: Secondary | ICD-10-CM | POA: Diagnosis not present

## 2017-01-04 NOTE — Progress Notes (Signed)
Office Visit Note   Patient: Kimberly Roth           Date of Birth: 1945/09/26           MRN: [id number] Visit Date: 01/04/2017              Requested by: [name], [place],  [id number] PCP: [name], FNP   Assessment & Plan: Visit Diagnoses:  1. Traumatic partial tear of right biceps tendon, sequela     Plan: Patient is continuing with physical therapy she is getting her arm up overhead she still lacks about 22 to 25 of symmetrical flexion and abduction. She still has pain in her shoulder and thinks overall she may have about 30% reduction in her pain from the surgery was done back in January. Prior surgeries using her sling all the time of breath the entire day. She is out of her sling. Previously she was doing traveling nursing. She is not able to do pulling lifting with that arm at this point. I'll have her continue therapy for 1 more month check her back afterwards and then will make a determination about possible  FCE vs modified work if it is available. 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  Follow-Up Instructions: I'll follow up 1 month. She'll continue therapy for 1 more month.  Orders:  No orders of the defined types were placed in this encounter.  No orders of the defined types were placed in this encounter.     Procedures: No procedures performed   Clinical Data: No additional findings.   Subjective: Chief Complaint  Patient presents with  . Right Shoulder - Follow-up    HPI patient returns post biceps tenodesis with problems with adhesive capsulitis. She is doing therapy twice a week still working our pulley at home she just started using some hand weights but they're on the lightest and weights likely 1 or 2 pounds. Her scar is well-healed she has good sensation in her hands no swelling in the fingers. She still has times when the pain bothers her she has some problems sleeping and  still been working hard to increase her range of motion.  Review of Systems 14 point review of systems updated and is unchanged from last office visit. Other than as mentioned in history of present illness   Objective: Vital Signs: BP (!) 149/81   Pulse 83   Ht 5\' 4"  (1.626 m)   Wt 131 lb (59.4 kg)   BMI 22.49 kg/m   Physical Exam  Constitutional: She is oriented to person, place, and time. She appears well-developed.  HENT:  Head: Normocephalic.  Right Ear: External ear normal.  Left Ear: External ear normal.  Eyes: Pupils are equal, round, and reactive to light.  Neck: No tracheal deviation present. No thyromegaly present.  Cardiovascular: Normal rate.   Pulmonary/Chest: Effort normal.  Abdominal: Soft.  Musculoskeletal:  Shoulder incision is well-healed. She has good elbow flexion. Fingers reach full extension good flexion. Actively she can now flex her arm to 130. She has some weakness of the shoulder girdle muscles. She still has slight limitation of cervical range of motion related to her spondylosis.  Neurological: She is alert and oriented to person, place, and time.  Skin: Skin is warm and dry.  Psychiatric: She has a normal mood and affect. Her behavior is normal.    Ortho Exam  Specialty Comments:  No specialty comments available.  Imaging: No  results found.   PMFS History: Patient Active Problem List   Diagnosis Date Noted  . Traumatic partial tear of right biceps tendon 06/29/2016  . Spondylosis of cervical region without myelopathy or radiculopathy 06/29/2016  . Systemic lupus ([place]) 09/02/2014  . Hyperlipidemia 09/02/2014  . Hypothyroidism 04/09/2013  . Depression 04/09/2013  . GAD (generalized anxiety disorder) 04/09/2013   Past Medical History:  Diagnosis Date  . Anxiety   . Breast cancer ([place])    breast bilateral  . Cataract   . History of shingles   . Hyperlipidemia   . PTSD (post-traumatic stress disorder)    related to childhood trauma    . Systemic lupus ([place])   . Uterine cancer ([place])     Family History  Problem Relation Age of Onset  . Heart failure Father   . Heart disease Father   . Heart attack Father 95  . Mental illness Mother   . Mental illness Sister        PTSD  . Osteoporosis Paternal Grandmother     Past Surgical History:  Procedure Laterality Date  . ABDOMINAL HYSTERECTOMY  1976   no oopherectomy  . APPENDECTOMY    . BACK SURGERY    . BREAST IMPLANT REMOVAL Bilateral    Silicone allergy  . BREAST SURGERY Bilateral 1991   Mastectomy  . CATARACT EXTRACTION W/PHACO Right 10/30/2013   Procedure: CATARACT EXTRACTION PHACO AND INTRAOCULAR LENS PLACEMENT (IOC);  Surgeon: Tonny Branch, MD;  Location: [facility];  Service: Ophthalmology;  Laterality: Right;  CDE:10.13  . CATARACT EXTRACTION W/PHACO Left 11/10/2013   Procedure: CATARACT EXTRACTION PHACO AND INTRAOCULAR LENS PLACEMENT (IOC);  Surgeon: Tonny Branch, MD;  Location: [facility];  Service: Ophthalmology;  Laterality: Left;  CDE 9.71  . DILATION AND CURETTAGE OF UTERUS    . EYE SURGERY Bilateral 2015   Cataract - Dr Geoffry Paradise in [place]  . JOINT REPLACEMENT Left 2005   Knee x 2  . MASTECTOMY Bilateral   . PLACEMENT OF BREAST IMPLANTS Bilateral    Secondary to cancer  . RECONSTRUCTION / CORRECTION OF NIPPLE / AEROLA Bilateral   . TONSILLECTOMY    . WISDOM TOOTH EXTRACTION Bilateral    Extraction x 4  . WRIST SURGERY Right 20's   patient not exactly sure of reason   Social History   Occupational History  . Not on file.   Social History Main Topics  . Smoking status: Never Smoker  . Smokeless tobacco: Never Used  . Alcohol use 0.6 oz/week    1 Glasses of wine per week     Comment: 1 or 2 times per year  . Drug use: No  . Sexual activity: Yes

## 2017-01-04 NOTE — Therapy (Signed)
[facility] [place], [place], [address] Phone: [phone]   Fax:  [phone]  Physical Therapy Treatment  Patient Details  Name: Kimberly Roth MRN: [id number] Date of Birth: 05/08/1946 Referring Provider: Rodell Perna MD.  Encounter Date: 01/04/2017      PT End of Session - 01/04/17 1714    Visit Number 29   Number of Visits 32   Date for PT Re-Evaluation 01/12/17   PT Start Time 0315   PT Stop Time 0404   PT Time Calculation (min) 49 min   Activity Tolerance Patient limited by pain;Patient tolerated treatment well   Behavior During Therapy [facility] for tasks assessed/performed      Past Medical History:  Diagnosis Date  . Anxiety   . Breast cancer ([place])    breast bilateral  . Cataract   . History of shingles   . Hyperlipidemia   . PTSD (post-traumatic stress disorder)    related to childhood trauma  . Systemic lupus ([place])   . Uterine cancer [facility])     Past Surgical History:  Procedure Laterality Date  . ABDOMINAL HYSTERECTOMY  1976   no oopherectomy  . APPENDECTOMY    . BACK SURGERY    . BREAST IMPLANT REMOVAL Bilateral    Silicone allergy  . BREAST SURGERY Bilateral 1991   Mastectomy  . CATARACT EXTRACTION W/PHACO Right 10/30/2013   Procedure: CATARACT EXTRACTION PHACO AND INTRAOCULAR LENS PLACEMENT (IOC);  Surgeon: Tonny Branch, MD;  Location: [facility];  Service: Ophthalmology;  Laterality: Right;  CDE:10.13  . CATARACT EXTRACTION W/PHACO Left 11/10/2013   Procedure: CATARACT EXTRACTION PHACO AND INTRAOCULAR LENS PLACEMENT (IOC);  Surgeon: Tonny Branch, MD;  Location: [facility];  Service: Ophthalmology;  Laterality: Left;  CDE 9.71  . DILATION AND CURETTAGE OF UTERUS    . EYE SURGERY Bilateral 2015   Cataract - Dr Geoffry Paradise in [place]  . JOINT REPLACEMENT Left 2005   Knee x 2  . MASTECTOMY Bilateral   . PLACEMENT OF BREAST IMPLANTS Bilateral    Secondary to cancer  . RECONSTRUCTION / CORRECTION OF NIPPLE / AEROLA  Bilateral   . TONSILLECTOMY    . WISDOM TOOTH EXTRACTION Bilateral    Extraction x 4  . WRIST SURGERY Right 20's   patient not exactly sure of reason    There were no vitals filed for this visit.      Subjective Assessment - 01/04/17 1715    Subjective Doctor said one more month of therapy.   Pain Score 5    Pain Location Shoulder   Pain Orientation Right   Pain Descriptors / Indicators Spasm;Throbbing   Pain Onset More than a month ago   Pain Frequency Constant                         [facility] Adult PT Treatment/Exercise - 01/04/17 0001      Shoulder Exercises: Pulleys   Flexion Limitations 5 minutes.   Other Pulley Exercises Wall ladder x 5 minutes.   Other Pulley Exercises UE Ranger x 3 minutes.     Modalities   Modalities Electrical Stimulation     Moist Heat Therapy   Number Minutes Moist Heat 14 Minutes   Moist Heat Location --  Left shoulder.     Manual Therapy   Manual Therapy Passive ROM   Passive ROM PAROM to patient's right shoulder in supine x 15 minutes and rhy stabs.  PT Long Term Goals - 10/30/16 1440      PT LONG TERM GOAL #1   Title Independent with an HEP.   Time 4   Period Weeks   Status On-going     PT LONG TERM GOAL #2   Title Active right shoulder flexion to 145 degrees so the patient can easily reach overhead.   Time 4   Period Weeks   Status On-going     PT LONG TERM GOAL #3   Title Active ER to 70 degrees+ to allow for easily donning/doffing of apparel   Time 4   Period Weeks   Status On-going     PT LONG TERM GOAL #4   Title Full right elbow extension.   Time 4   Period Weeks   Status Achieved  10/30/16     PT LONG TERM GOAL #5   Title Perform ADL's with pain not > 3/10.   Time 4   Period Weeks   Status On-going               Plan - 01/04/17 1721    Clinical Impression Statement Patient's left shoulder passive flexion= 150 degrees and ER= 75 degrees today with  low load long duration stretching technique today.      Patient will benefit from skilled therapeutic intervention in order to improve the following deficits and impairments:     Visit Diagnosis: Acute pain of left shoulder  Stiffness of left shoulder, not elsewhere classified     Problem List Patient Active Problem List   Diagnosis Date Noted  . Traumatic partial tear of right biceps tendon 06/29/2016  . Spondylosis of cervical region without myelopathy or radiculopathy 06/29/2016  . Systemic lupus ([place]) 09/02/2014  . Hyperlipidemia 09/02/2014  . Hypothyroidism 04/09/2013  . Depression 04/09/2013  . GAD (generalized anxiety disorder) 04/09/2013    Nowell Sites, [place] MPT 01/04/2017, 5:24 PM  [facility] [address] [place], [place], [address] Phone: [phone]   Fax:  [phone]  Name: Kimberly Roth MRN: [id number] Date of Birth: Oct 31, 1945

## 2017-01-04 NOTE — Addendum Note (Signed)
Addended by: Carmine Savoy on: 01/04/2017 10:24 AM   Modules accepted: Orders

## 2017-01-08 ENCOUNTER — Encounter: Payer: Self-pay | Admitting: Physical Therapy

## 2017-01-09 ENCOUNTER — Encounter: Payer: Self-pay | Admitting: Physical Therapy

## 2017-01-09 ENCOUNTER — Ambulatory Visit: Payer: Worker's Compensation | Admitting: Physical Therapy

## 2017-01-09 DIAGNOSIS — M25612 Stiffness of left shoulder, not elsewhere classified: Secondary | ICD-10-CM

## 2017-01-09 DIAGNOSIS — M25512 Pain in left shoulder: Secondary | ICD-10-CM

## 2017-01-09 DIAGNOSIS — M25511 Pain in right shoulder: Secondary | ICD-10-CM | POA: Diagnosis not present

## 2017-01-09 NOTE — Therapy (Signed)
[facility] [place], [place], [address] Phone: [phone]   Fax:  [phone]  Physical Therapy Treatment  Patient Details  Name: Kimberly Roth MRN: [id number] Date of Birth: 16-Jan-1946 Referring Provider: Rodell Perna MD.  Encounter Date: 01/09/2017      PT End of Session - 01/09/17 1358    Visit Number 30   Number of Visits 32   Date for PT Re-Evaluation 01/12/17   PT Start Time 3419   PT Stop Time 6222   PT Time Calculation (min) 57 min   Activity Tolerance Patient limited by pain;Patient tolerated treatment well   Behavior During Therapy [facility] for tasks assessed/performed      Past Medical History:  Diagnosis Date  . Anxiety   . Breast cancer ([place])    breast bilateral  . Cataract   . History of shingles   . Hyperlipidemia   . PTSD (post-traumatic stress disorder)    related to childhood trauma  . Systemic lupus ([place])   . Uterine cancer [facility])     Past Surgical History:  Procedure Laterality Date  . ABDOMINAL HYSTERECTOMY  1976   no oopherectomy  . APPENDECTOMY    . BACK SURGERY    . BREAST IMPLANT REMOVAL Bilateral    Silicone allergy  . BREAST SURGERY Bilateral 1991   Mastectomy  . CATARACT EXTRACTION W/PHACO Right 10/30/2013   Procedure: CATARACT EXTRACTION PHACO AND INTRAOCULAR LENS PLACEMENT (IOC);  Surgeon: Tonny Branch, MD;  Location: [facility];  Service: Ophthalmology;  Laterality: Right;  CDE:10.13  . CATARACT EXTRACTION W/PHACO Left 11/10/2013   Procedure: CATARACT EXTRACTION PHACO AND INTRAOCULAR LENS PLACEMENT (IOC);  Surgeon: Tonny Branch, MD;  Location: [facility];  Service: Ophthalmology;  Laterality: Left;  CDE 9.71  . DILATION AND CURETTAGE OF UTERUS    . EYE SURGERY Bilateral 2015   Cataract - Dr Geoffry Paradise in [place]  . JOINT REPLACEMENT Left 2005   Knee x 2  . MASTECTOMY Bilateral   . PLACEMENT OF BREAST IMPLANTS Bilateral    Secondary to cancer  . RECONSTRUCTION / CORRECTION OF NIPPLE / AEROLA  Bilateral   . TONSILLECTOMY    . WISDOM TOOTH EXTRACTION Bilateral    Extraction x 4  . WRIST SURGERY Right 20's   patient not exactly sure of reason    There were no vitals filed for this visit.      Subjective Assessment - 01/09/17 1349    Subjective Reports that she has been trying the exercises and has some soreness.   Patient Stated Goals Regain use of right UE.   Currently in Pain? Yes   Pain Score --  No pain score provided   Pain Location Shoulder   Pain Orientation Right   Pain Descriptors / Indicators Sore   Pain Type Surgical pain   Pain Onset More than a month ago            [facility] PT Assessment - 01/09/17 0001      Assessment   Medical Diagnosis Traumatic tear of right biceps tendon.   Onset Date/Surgical Date 08/07/16     Restrictions   Weight Bearing Restrictions No                     [facility] Adult PT Treatment/Exercise - 01/09/17 0001      Shoulder Exercises: Supine   Flexion AROM;Both;10 reps     Shoulder Exercises: Standing   Other Standing Exercises ball walks on wall x5  min with only 4 reps     Shoulder Exercises: Pulleys   Flexion Other (comment)  x5 min   Other Pulley Exercises UE Ranger x 5 minutes.     Shoulder Exercises: ROM/Strengthening   UBE (Upper Arm Bike) 90 RPM x5 min     Modalities   Modalities Electrical Stimulation;Moist Heat     Moist Heat Therapy   Number Minutes Moist Heat 15 Minutes   Moist Heat Location Shoulder     Electrical Stimulation   Electrical Stimulation Location R shoulder   Electrical Stimulation Action Pre-Mod   Electrical Stimulation Parameters 80-150 hz x15 min   Electrical Stimulation Goals Pain     Manual Therapy   Manual Therapy Passive ROM   Passive ROM PROM of R shoulder into flexion/ER to promote proper ROM                     PT Long Term Goals - 10/30/16 1440      PT LONG TERM GOAL #1   Title Independent with an HEP.   Time 4   Period Weeks   Status  On-going     PT LONG TERM GOAL #2   Title Active right shoulder flexion to 145 degrees so the patient can easily reach overhead.   Time 4   Period Weeks   Status On-going     PT LONG TERM GOAL #3   Title Active ER to 70 degrees+ to allow for easily donning/doffing of apparel   Time 4   Period Weeks   Status On-going     PT LONG TERM GOAL #4   Title Full right elbow extension.   Time 4   Period Weeks   Status Achieved  10/30/16     PT LONG TERM GOAL #5   Title Perform ADL's with pain not > 3/10.   Time 4   Period Weeks   Status On-going               Plan - 01/09/17 1436    Clinical Impression Statement Patient tolerated today's treatment fairly well as more active exercises pursued today. Patient still completes exercises in slow pace and intermittantly experienced spasms per patient report. Ball rolls completed against the wall today with assist from LUE but completed extremely slow with very few reps. AROM flexion completed in supine today with VCs and tactile cueing for proper range for exercise. PROM of R shoulder flexion and ER improved today especially ER. Normal modalities response noted following removal of the modalities.   Rehab Potential Good   Clinical Impairments Affecting Rehab Potential surgery 08/07/16 - 15 weeks current 11/19/16 ( 09/11/16 eval 10 weeks post eval 11/20/16)   PT Frequency 2x / week   PT Duration 4 weeks   PT Treatment/Interventions ADLs/Self Care Home Management;Electrical Stimulation;Moist Heat;Ultrasound;Patient/family education;Therapeutic exercise;Therapeutic activities;Passive range of motion   PT Next Visit Plan cont with POC assess RW4 exercise and may consider deloading eccentric contol exercise   PT Home Exercise Plan scap retraction and depression   Consulted and Agree with Plan of Care Patient      Patient will benefit from skilled therapeutic intervention in order to improve the following deficits and impairments:  Pain, Decreased  activity tolerance, Decreased range of motion  Visit Diagnosis: Acute pain of left shoulder  Stiffness of left shoulder, not elsewhere classified     Problem List Patient Active Problem List   Diagnosis Date Noted  . Traumatic partial tear of right  biceps tendon 06/29/2016  . Spondylosis of cervical region without myelopathy or radiculopathy 06/29/2016  . Systemic lupus ([place]) 09/02/2014  . Hyperlipidemia 09/02/2014  . Hypothyroidism 04/09/2013  . Depression 04/09/2013  . GAD (generalized anxiety disorder) 04/09/2013    Wynelle Fanny, PTA 01/09/2017, 3:06 PM  [facility] [address] [place], [place], [address] Phone: [phone]   Fax:  [phone]  Name: Kimberly Roth MRN: [id number] Date of Birth: 1946-06-28

## 2017-01-10 ENCOUNTER — Other Ambulatory Visit: Payer: Self-pay | Admitting: Nurse Practitioner

## 2017-01-10 ENCOUNTER — Telehealth: Payer: Self-pay | Admitting: Nurse Practitioner

## 2017-01-10 DIAGNOSIS — R222 Localized swelling, mass and lump, trunk: Secondary | ICD-10-CM

## 2017-01-10 NOTE — Telephone Encounter (Signed)
Pt aware of appointment date/time of breast ultrasound and given the results of the [place]

## 2017-01-11 ENCOUNTER — Encounter: Payer: Self-pay | Admitting: Physical Therapy

## 2017-01-11 ENCOUNTER — Ambulatory Visit: Payer: Worker's Compensation | Admitting: Physical Therapy

## 2017-01-11 DIAGNOSIS — M25512 Pain in left shoulder: Secondary | ICD-10-CM

## 2017-01-11 DIAGNOSIS — G8929 Other chronic pain: Secondary | ICD-10-CM

## 2017-01-11 DIAGNOSIS — M25511 Pain in right shoulder: Secondary | ICD-10-CM | POA: Diagnosis not present

## 2017-01-11 DIAGNOSIS — M25611 Stiffness of right shoulder, not elsewhere classified: Secondary | ICD-10-CM

## 2017-01-11 DIAGNOSIS — M25612 Stiffness of left shoulder, not elsewhere classified: Secondary | ICD-10-CM

## 2017-01-11 NOTE — Therapy (Signed)
[facility] [place], [place], [address] Phone: [phone]   Fax:  [phone]  Physical Therapy Treatment  Patient Details  Name: Kimberly Roth MRN: [id number] Date of Birth: [birth year] Referring Provider: Rodell Perna MD.  Encounter Date: 01/11/2017      PT End of Session - 01/11/17 1415    Visit Number 31   Number of Visits 32   Date for PT Re-Evaluation 02/11/17   PT Start Time 2542   PT Stop Time 7062   PT Time Calculation (min) 43 min   Activity Tolerance Patient limited by pain;Patient tolerated treatment well   Behavior During Therapy [facility] for tasks assessed/performed      Past Medical History:  Diagnosis Date  . Anxiety   . Breast cancer ([place])    breast bilateral  . Cataract   . History of shingles   . Hyperlipidemia   . PTSD (post-traumatic stress disorder)    related to childhood trauma  . Systemic lupus ([place])   . Uterine cancer [facility])     Past Surgical History:  Procedure Laterality Date  . ABDOMINAL HYSTERECTOMY  1976   no oopherectomy  . APPENDECTOMY    . BACK SURGERY    . BREAST IMPLANT REMOVAL Bilateral    Silicone allergy  . BREAST SURGERY Bilateral 1991   Mastectomy  . CATARACT EXTRACTION W/PHACO Right 10/30/2013   Procedure: CATARACT EXTRACTION PHACO AND INTRAOCULAR LENS PLACEMENT (IOC);  Surgeon: Tonny Branch, MD;  Location: [facility];  Service: Ophthalmology;  Laterality: Right;  CDE:10.13  . CATARACT EXTRACTION W/PHACO Left 11/10/2013   Procedure: CATARACT EXTRACTION PHACO AND INTRAOCULAR LENS PLACEMENT (IOC);  Surgeon: Tonny Branch, MD;  Location: [facility];  Service: Ophthalmology;  Laterality: Left;  CDE 9.71  . DILATION AND CURETTAGE OF UTERUS    . EYE SURGERY Bilateral 2015   Cataract - Dr Geoffry Paradise in [place]  . JOINT REPLACEMENT Left 2005   Knee x 2  . MASTECTOMY Bilateral   . PLACEMENT OF BREAST IMPLANTS Bilateral    Secondary to cancer  . RECONSTRUCTION / CORRECTION OF NIPPLE / AEROLA  Bilateral   . TONSILLECTOMY    . WISDOM TOOTH EXTRACTION Bilateral    Extraction x 4  . WRIST SURGERY Right 20's   patient not exactly sure of reason    There were no vitals filed for this visit.      Subjective Assessment - 01/11/17 1347    Subjective Patient reported increased soreness from doing a lot of exercises at home   Patient Stated Goals Regain use of right UE.   Pain Score 7    Pain Location Shoulder   Pain Orientation Right   Pain Descriptors / Indicators Spasm   Pain Type Surgical pain   Pain Onset More than a month ago   Pain Frequency Intermittent   Aggravating Factors  certain movements   Pain Relieving Factors at rest            [facility] PT Assessment - 01/11/17 0001      AROM   AROM Assessment Site Shoulder   Right/Left Shoulder Right   Right Shoulder Flexion 80 Degrees   Right Shoulder External Rotation 70 Degrees     PROM   PROM Assessment Site Shoulder   Right/Left Shoulder Right   Right Shoulder Flexion 145 Degrees                     [facility] Adult PT Treatment/Exercise - 01/11/17  0001      Shoulder Exercises: Supine   Other Supine Exercises cane for fleion x4, chest press x 5     Shoulder Exercises: Standing   Other Standing Exercises wall cirles 2x10   Other Standing Exercises wall slide x2     Shoulder Exercises: Pulleys   Flexion Other (comment)  65mn   Other Pulley Exercises Wall ladder x 5 minutes.   Other Pulley Exercises UE for elevation and circles 2x10                     PT Long Term Goals - 01/11/17 1414      PT LONG TERM GOAL #1   Title Independent with an HEP.   Time 4   Period Weeks   Status Achieved     PT LONG TERM GOAL #2   Title Active right shoulder flexion to 145 degrees so the patient can easily reach overhead.   Time 4   Period Weeks   Status On-going  AROM 80 degrees 01/11/17     PT LONG TERM GOAL #3   Title Active ER to 70 degrees+ to allow for easily donning/doffing of  apparel   Time 4   Period Weeks   Status Achieved  AROM 70 degrees 01/11/17     PT LONG TERM GOAL #4   Title Full right elbow extension.   Time 4   Period Weeks   Status Achieved     PT LONG TERM GOAL #5   Title Perform ADL's with pain not > 3/10.   Time 4   Period Weeks   Status On-going               Plan - 01/11/17 1423    Clinical Impression Statement Patient tolerated treatment fair today. Patient is limited with all exercises due to reportd pain. Patient able to perfom full P/AAROM to meet current goal standards. Patient has ongoing pain and spasms in right shoulder. Patient reported doing daily HEP's. Patient met LTG #1 and #3 today. Others ongoing due to pain and strength limitations.   Rehab Potential Good   Clinical Impairments Affecting Rehab Potential surgery 08/07/16 - 22 weeks current 01/07/17 ( 09/11/16 eval 10 weeks post eval 11/20/16)   PT Frequency 2x / week   PT Duration 4 weeks   PT Treatment/Interventions ADLs/Self Care Home Management;Electrical Stimulation;Moist Heat;Ultrasound;Patient/family education;Therapeutic exercise;Therapeutic activities;Passive range of motion   PT Next Visit Plan cont with POC   Consulted and Agree with Plan of Care Patient      Patient will benefit from skilled therapeutic intervention in order to improve the following deficits and impairments:  Pain, Decreased activity tolerance, Decreased range of motion  Visit Diagnosis: Acute pain of left shoulder  Stiffness of left shoulder, not elsewhere classified  Chronic right shoulder pain  Stiffness of right shoulder, not elsewhere classified     Problem List Patient Active Problem List   Diagnosis Date Noted  . Traumatic partial tear of right biceps tendon 06/29/2016  . Spondylosis of cervical region without myelopathy or radiculopathy 06/29/2016  . Systemic lupus ([place] 09/02/2014  . Hyperlipidemia 09/02/2014  . Hypothyroidism 04/09/2013  . Depression 04/09/2013  .  GAD (generalized anxiety disorder) 04/09/2013    DPhillips Climes PTA 01/11/2017, 2:43 PM  [facility] [address]Phone: [phone]  Fax:  [phone] Name: Kimberly PINARDMRN: [id number] of Birth: 8February 15, 1947

## 2017-01-15 ENCOUNTER — Other Ambulatory Visit: Payer: Self-pay | Admitting: Nurse Practitioner

## 2017-01-15 ENCOUNTER — Ambulatory Visit: Payer: Worker's Compensation | Admitting: Physical Therapy

## 2017-01-15 ENCOUNTER — Encounter: Payer: Self-pay | Admitting: Physical Therapy

## 2017-01-15 DIAGNOSIS — M25512 Pain in left shoulder: Secondary | ICD-10-CM

## 2017-01-15 DIAGNOSIS — N63 Unspecified lump in unspecified breast: Secondary | ICD-10-CM

## 2017-01-15 DIAGNOSIS — G8929 Other chronic pain: Secondary | ICD-10-CM

## 2017-01-15 DIAGNOSIS — M25612 Stiffness of left shoulder, not elsewhere classified: Secondary | ICD-10-CM

## 2017-01-15 DIAGNOSIS — M25511 Pain in right shoulder: Secondary | ICD-10-CM

## 2017-01-15 DIAGNOSIS — M25611 Stiffness of right shoulder, not elsewhere classified: Secondary | ICD-10-CM

## 2017-01-15 NOTE — Therapy (Signed)
[facility] [place], [place], [address] Phone: [phone]   Fax:  [phone]  Physical Therapy Treatment  Patient Details  Name: Kimberly Roth MRN: [id number] Date of Birth: August 08, 1945 Referring Provider: Rodell Perna MD.  Encounter Date: 01/15/2017      PT End of Session - 01/15/17 1449    Visit Number 32   Date for PT Re-Evaluation 02/11/17   PT Start Time 6144   PT Stop Time 1513   PT Time Calculation (min) 42 min   Activity Tolerance Patient limited by pain;Patient tolerated treatment well   Behavior During Therapy [facility] for tasks assessed/performed      Past Medical History:  Diagnosis Date  . Anxiety   . Breast cancer ([place])    breast bilateral  . Cataract   . History of shingles   . Hyperlipidemia   . PTSD (post-traumatic stress disorder)    related to childhood trauma  . Systemic lupus ([place])   . Uterine cancer [facility])     Past Surgical History:  Procedure Laterality Date  . ABDOMINAL HYSTERECTOMY  1976   no oopherectomy  . APPENDECTOMY    . BACK SURGERY    . BREAST IMPLANT REMOVAL Bilateral    Silicone allergy  . BREAST SURGERY Bilateral 1991   Mastectomy  . CATARACT EXTRACTION W/PHACO Right 10/30/2013   Procedure: CATARACT EXTRACTION PHACO AND INTRAOCULAR LENS PLACEMENT (IOC);  Surgeon: Tonny Branch, MD;  Location: [facility];  Service: Ophthalmology;  Laterality: Right;  CDE:10.13  . CATARACT EXTRACTION W/PHACO Left 11/10/2013   Procedure: CATARACT EXTRACTION PHACO AND INTRAOCULAR LENS PLACEMENT (IOC);  Surgeon: Tonny Branch, MD;  Location: [facility];  Service: Ophthalmology;  Laterality: Left;  CDE 9.71  . DILATION AND CURETTAGE OF UTERUS    . EYE SURGERY Bilateral 2015   Cataract - Dr Geoffry Paradise in [place]  . JOINT REPLACEMENT Left 2005   Knee x 2  . MASTECTOMY Bilateral   . PLACEMENT OF BREAST IMPLANTS Bilateral    Secondary to cancer  . RECONSTRUCTION / CORRECTION OF NIPPLE / AEROLA Bilateral   .  TONSILLECTOMY    . WISDOM TOOTH EXTRACTION Bilateral    Extraction x 4  . WRIST SURGERY Right 20's   patient not exactly sure of reason    There were no vitals filed for this visit.      Subjective Assessment - 01/15/17 1433    Subjective Patient reported ongoing soreness, no improvement today. Patient reported that insurance will not approve any more therapy   Patient Stated Goals Regain use of right UE.   Currently in Pain? Yes   Pain Score 7    Pain Location Shoulder   Pain Orientation Right   Pain Descriptors / Indicators Spasm   Pain Type Surgical pain   Pain Onset More than a month ago   Pain Frequency Intermittent   Aggravating Factors  certain movements   Pain Relieving Factors at rest            [facility] PT Assessment - 01/15/17 0001      ROM / Strength   AROM / PROM / Strength AROM;PROM     PROM   PROM Assessment Site Shoulder;Elbow   Right/Left Shoulder Right   Right Shoulder Flexion 145 Degrees   Right Shoulder External Rotation 75 Degrees   Right/Left Elbow Right   Right Elbow Extension 0  [facility] Adult PT Treatment/Exercise - 01/15/17 0001      Shoulder Exercises: Supine   Other Supine Exercises self flexion with assist from left UE x10     Shoulder Exercises: Standing   Other Standing Exercises wall cirles 2x10     Shoulder Exercises: Pulleys   Flexion Other (comment)  48min   Other Pulley Exercises Wall ladder x 5 minutes.   Other Pulley Exercises UE for elevation and circles 2x10     Moist Heat Therapy   Number Minutes Moist Heat 15 Minutes   Moist Heat Location Shoulder     Electrical Stimulation   Electrical Stimulation Location rt shoulder   Electrical Stimulation Action premod   Electrical Stimulation Parameters 80-150hz  x15   Electrical Stimulation Goals Pain     Manual Therapy   Manual Therapy Passive ROM   Passive ROM PROM of R shoulder into flexion/ER to promote proper ROM                      PT Long Term Goals - 01/11/17 1414      PT LONG TERM GOAL #1   Title Independent with an HEP.   Time 4   Period Weeks   Status Achieved     PT LONG TERM GOAL #2   Title Active right shoulder flexion to 145 degrees so the patient can easily reach overhead.   Time 4   Period Weeks   Status On-going  AROM 80 degrees 01/11/17     PT LONG TERM GOAL #3   Title Active ER to 70 degrees+ to allow for easily donning/doffing of apparel   Time 4   Period Weeks   Status Achieved  AROM 70 degrees 01/11/17     PT LONG TERM GOAL #4   Title Full right elbow extension.   Time 4   Period Weeks   Status Achieved     PT LONG TERM GOAL #5   Title Perform ADL's with pain not > 3/10.   Time 4   Period Weeks   Status On-going               Plan - 01/15/17 1505    Clinical Impression Statement Patient tolerated treatment fairly well today. Patient limited with all exercises due to reported increased pain and spasms. Patient has full PROM for all right shoulder movements. Patient unable to perform any active movements due to reported pain. Educated patient on AAROM self progression for right shoulder flexion in supine to sidelying then standing when patient able to progress. Patient is limited with visits due to insurance. Patient unable to meet strength goals due to deficts.    Rehab Potential Good   Clinical Impairments Affecting Rehab Potential surgery 08/07/16 - 23 weeks current 01/14/17    PT Frequency 2x / week   PT Duration 4 weeks   PT Treatment/Interventions ADLs/Self Care Home Management;Electrical Stimulation;Moist Heat;Ultrasound;Patient/family education;Therapeutic exercise;Therapeutic activities;Passive range of motion   PT Next Visit Plan cont with POC per MPT and patient tolerance (may try kneeling rows and issue HEP )   Consulted and Agree with Plan of Care Patient      Patient will benefit from skilled therapeutic intervention in order to  improve the following deficits and impairments:  Pain, Decreased activity tolerance, Decreased range of motion  Visit Diagnosis: Acute pain of left shoulder  Stiffness of left shoulder, not elsewhere classified  Chronic right shoulder pain  Stiffness of right shoulder, not elsewhere classified  Problem List Patient Active Problem List   Diagnosis Date Noted  . Traumatic partial tear of right biceps tendon 06/29/2016  . Spondylosis of cervical region without myelopathy or radiculopathy 06/29/2016  . Systemic lupus ([place]) 09/02/2014  . Hyperlipidemia 09/02/2014  . Hypothyroidism 04/09/2013  . Depression 04/09/2013  . GAD (generalized anxiety disorder) 04/09/2013    Phillips Climes, PTA 01/15/2017, 3:17 PM  [facility], [place], [address] Phone: [phone]   Fax:  [phone]  Name: Kimberly Roth MRN: [id number] Date of Birth: August 08, 1945

## 2017-01-16 ENCOUNTER — Ambulatory Visit (HOSPITAL_COMMUNITY)
Admission: RE | Admit: 2017-01-16 | Discharge: 2017-01-16 | Disposition: A | Discharge status: Home / Self Care | Payer: Medicare Other | Source: Ambulatory Visit | Attending: Nurse Practitioner | Admitting: Nurse Practitioner

## 2017-01-16 ENCOUNTER — Ambulatory Visit (HOSPITAL_COMMUNITY): Admission: RE | Admit: 2017-01-16 | Payer: Medicare Other | Source: Ambulatory Visit

## 2017-01-16 ENCOUNTER — Ambulatory Visit (HOSPITAL_COMMUNITY): Payer: Medicare Other

## 2017-01-16 ENCOUNTER — Other Ambulatory Visit: Payer: Self-pay | Admitting: Nurse Practitioner

## 2017-01-16 DIAGNOSIS — N6320 Unspecified lump in the left breast, unspecified quadrant: Secondary | ICD-10-CM | POA: Diagnosis not present

## 2017-01-16 DIAGNOSIS — R229 Localized swelling, mass and lump, unspecified: Principal | ICD-10-CM

## 2017-01-16 DIAGNOSIS — N63 Unspecified lump in unspecified breast: Secondary | ICD-10-CM

## 2017-01-16 DIAGNOSIS — R928 Other abnormal and inconclusive findings on diagnostic imaging of breast: Secondary | ICD-10-CM | POA: Diagnosis not present

## 2017-01-16 DIAGNOSIS — IMO0002 Reserved for concepts with insufficient information to code with codable children: Secondary | ICD-10-CM

## 2017-01-16 DIAGNOSIS — N6310 Unspecified lump in the right breast, unspecified quadrant: Secondary | ICD-10-CM | POA: Insufficient documentation

## 2017-01-16 DIAGNOSIS — N6489 Other specified disorders of breast: Secondary | ICD-10-CM | POA: Diagnosis not present

## 2017-01-18 ENCOUNTER — Encounter: Payer: Self-pay | Admitting: Physical Therapy

## 2017-01-18 ENCOUNTER — Ambulatory Visit: Payer: Worker's Compensation | Admitting: Physical Therapy

## 2017-01-18 DIAGNOSIS — G8929 Other chronic pain: Secondary | ICD-10-CM

## 2017-01-18 DIAGNOSIS — M25512 Pain in left shoulder: Secondary | ICD-10-CM

## 2017-01-18 DIAGNOSIS — M25611 Stiffness of right shoulder, not elsewhere classified: Secondary | ICD-10-CM

## 2017-01-18 DIAGNOSIS — M25612 Stiffness of left shoulder, not elsewhere classified: Secondary | ICD-10-CM

## 2017-01-18 DIAGNOSIS — M25511 Pain in right shoulder: Secondary | ICD-10-CM | POA: Diagnosis not present

## 2017-01-18 NOTE — Therapy (Signed)
[facility] [place], [place], [address] Phone: [phone]   Fax:  [phone]  Physical Therapy Treatment  Patient Details  Name: Kimberly Roth MRN: [id number] Date of Birth: 14-Jul-1946 Referring Provider: Rodell Perna MD.  Encounter Date: 01/18/2017      PT End of Session - 01/18/17 1414    Visit Number 33   Date for PT Re-Evaluation 02/11/17   PT Start Time 5176   PT Stop Time 1428   PT Time Calculation (min) 43 min   Activity Tolerance Patient limited by pain;Patient tolerated treatment well   Behavior During Therapy [facility] for tasks assessed/performed      Past Medical History:  Diagnosis Date  . Anxiety   . Breast cancer ([place])    breast bilateral  . Cataract   . History of shingles   . Hyperlipidemia   . PTSD (post-traumatic stress disorder)    related to childhood trauma  . Systemic lupus ([place])   . Uterine cancer [facility])     Past Surgical History:  Procedure Laterality Date  . ABDOMINAL HYSTERECTOMY  1976   no oopherectomy  . APPENDECTOMY    . BACK SURGERY    . BREAST IMPLANT REMOVAL Bilateral    Silicone allergy  . BREAST SURGERY Bilateral 1991   Mastectomy  . CATARACT EXTRACTION W/PHACO Right 10/30/2013   Procedure: CATARACT EXTRACTION PHACO AND INTRAOCULAR LENS PLACEMENT (IOC);  Surgeon: Tonny Branch, MD;  Location: [facility];  Service: Ophthalmology;  Laterality: Right;  CDE:10.13  . CATARACT EXTRACTION W/PHACO Left 11/10/2013   Procedure: CATARACT EXTRACTION PHACO AND INTRAOCULAR LENS PLACEMENT (IOC);  Surgeon: Tonny Branch, MD;  Location: [facility];  Service: Ophthalmology;  Laterality: Left;  CDE 9.71  . DILATION AND CURETTAGE OF UTERUS    . EYE SURGERY Bilateral 2015   Cataract - Dr Geoffry Paradise in [place]  . JOINT REPLACEMENT Left 2005   Knee x 2  . MASTECTOMY Bilateral   . PLACEMENT OF BREAST IMPLANTS Bilateral    Secondary to cancer  . RECONSTRUCTION / CORRECTION OF NIPPLE / AEROLA Bilateral   .  TONSILLECTOMY    . WISDOM TOOTH EXTRACTION Bilateral    Extraction x 4  . WRIST SURGERY Right 20's   patient not exactly sure of reason    There were no vitals filed for this visit.      Subjective Assessment - 01/18/17 1347    Subjective Patient reported ongoing soreness/muscle spasms, no improvement reported   Patient Stated Goals Regain use of right UE.   Currently in Pain? Yes   Pain Score 7    Pain Location Shoulder   Pain Orientation Right   Pain Descriptors / Indicators Spasm   Pain Type Surgical pain   Pain Onset More than a month ago   Pain Frequency Intermittent   Aggravating Factors  certain movements   Pain Relieving Factors at rest                         [facility] Adult PT Treatment/Exercise - 01/18/17 0001      Shoulder Exercises: Seated   Other Seated Exercises seated RW4 with yellow t-band x4-5 each way half range only  patient limitd due to reported pain/spasms     Shoulder Exercises: Standing   Other Standing Exercises table red swiss ball flexion/side to side x 20     Shoulder Exercises: Pulleys   Flexion Other (comment)  51min   Other Pulley Exercises  Wall ladder x 5 minutes.   Other Pulley Exercises UE for elevation and circles 2x10     Moist Heat Therapy   Number Minutes Moist Heat 15 Minutes   Moist Heat Location Shoulder     Electrical Stimulation   Electrical Stimulation Location rt shoulder   Electrical Stimulation Action premod   Electrical Stimulation Parameters 80-150hz x49min   Electrical Stimulation Goals Pain                     PT Long Term Goals - 01/11/17 1414      PT LONG TERM GOAL #1   Title Independent with an HEP.   Time 4   Period Weeks   Status Achieved     PT LONG TERM GOAL #2   Title Active right shoulder flexion to 145 degrees so the patient can easily reach overhead.   Time 4   Period Weeks   Status On-going  AROM 80 degrees 01/11/17     PT LONG TERM GOAL #3   Title Active ER to  70 degrees+ to allow for easily donning/doffing of apparel   Time 4   Period Weeks   Status Achieved  AROM 70 degrees 01/11/17     PT LONG TERM GOAL #4   Title Full right elbow extension.   Time 4   Period Weeks   Status Achieved     PT LONG TERM GOAL #5   Title Perform ADL's with pain not > 3/10.   Time 4   Period Weeks   Status On-going               Plan - 01/18/17 1415    Clinical Impression Statement Patient tolerated treatment fair due to pain/spasm limitations. Patient able to complete AAROM pulley's with no reported spasms yet any resistance exercises attempted increased her pain/spasms. Patient has been unable to perform any active exercises thus far. Goals ongoing due to pain and strength deficts.   Rehab Potential Good   Clinical Impairments Affecting Rehab Potential surgery 08/07/16 - 23 weeks current 01/14/17    PT Frequency 2x / week   PT Duration 4 weeks   PT Treatment/Interventions ADLs/Self Care Home Management;Electrical Stimulation;Moist Heat;Ultrasound;Patient/family education;Therapeutic exercise;Therapeutic activities;Passive range of motion   PT Next Visit Plan cont with POC per MPT and patient tolerance (may try kneeling rows or supported ER 90/90 and issue HEP )   Consulted and Agree with Plan of Care Patient      Patient will benefit from skilled therapeutic intervention in order to improve the following deficits and impairments:  Pain, Decreased activity tolerance, Decreased range of motion  Visit Diagnosis: Acute pain of left shoulder  Stiffness of left shoulder, not elsewhere classified  Chronic right shoulder pain  Stiffness of right shoulder, not elsewhere classified     Problem List Patient Active Problem List   Diagnosis Date Noted  . Traumatic partial tear of right biceps tendon 06/29/2016  . Spondylosis of cervical region without myelopathy or radiculopathy 06/29/2016  . Systemic lupus ([place]) 09/02/2014  . Hyperlipidemia  09/02/2014  . Hypothyroidism 04/09/2013  . Depression 04/09/2013  . GAD (generalized anxiety disorder) 04/09/2013    Phillips Climes, PTA 01/18/2017, 2:30 PM  [facility] [address] [place], [place], [address] Phone: [phone]   Fax:  [phone]  Name: Kimberly Roth MRN: [id number] Date of Birth: 02/26/1946

## 2017-01-22 ENCOUNTER — Ambulatory Visit: Payer: Worker's Compensation | Admitting: Physical Therapy

## 2017-01-22 ENCOUNTER — Encounter: Payer: Self-pay | Admitting: Physical Therapy

## 2017-01-22 DIAGNOSIS — M25611 Stiffness of right shoulder, not elsewhere classified: Secondary | ICD-10-CM

## 2017-01-22 DIAGNOSIS — M25512 Pain in left shoulder: Secondary | ICD-10-CM

## 2017-01-22 DIAGNOSIS — M25511 Pain in right shoulder: Secondary | ICD-10-CM | POA: Diagnosis not present

## 2017-01-22 DIAGNOSIS — M25612 Stiffness of left shoulder, not elsewhere classified: Secondary | ICD-10-CM

## 2017-01-22 DIAGNOSIS — G8929 Other chronic pain: Secondary | ICD-10-CM

## 2017-01-22 NOTE — Patient Instructions (Addendum)
ROM: Saw (Protraction / Retraction)    Reach right arm out in front, then pull arm back, pinching shoulder blades together. Repeat _10___ times per set. Do _1-2___ sets per session. Do _1-2___ sessions per day.   Scapular Retraction: Elbow Flexion (Standing)    With elbows bent to 90, pinch shoulder blades together and rotate arms out, keeping elbows bent. Repeat 10-20____ times per set. Do __1-2__ sets per session. Do __1-2__ sessions per day.   ROM: Pendulum (Flexion / Extension)    Bring right arm toward back.  Repeat __10__ times per set. Do __1-2__ sets per session. Do __1-2__ sessions per day.   Extremity Flexion (Hook-Lying)    Bring right arm over head then back down by side. Repeat _10___ times per set. Do ___1-2_ sets per session. Do __1-2__ sessions per day.     While seated, prop your elbow on a table with a pillow or bolster so that your upper arm is approx.  Maintain a 90 degree bend in your elbow.  Roll your shoulder so that your forearm points upward and then roll it down so that your forearm point forward as shown. Repeat this slowly and controlled. x10 1-2 times daily

## 2017-01-22 NOTE — Therapy (Signed)
[facility] [place], [place], [address] Phone: [phone]   Fax:  [phone]  Physical Therapy Treatment  Patient Details  Name: Kimberly Roth MRN: [id number] Date of Birth: [birth year] Referring Provider: Rodell Perna MD.  Encounter Date: 01/22/2017      PT End of Session - 01/22/17 1512    Visit Number 34   Date for PT Re-Evaluation 02/11/17   PT Start Time 1430   PT Stop Time 1512   PT Time Calculation (min) 42 min   Activity Tolerance Patient limited by pain;Patient tolerated treatment well      Past Medical History:  Diagnosis Date  . Anxiety   . Breast cancer ([facility])    breast bilateral  . Cataract   . History of shingles   . Hyperlipidemia   . PTSD (post-traumatic stress disorder)    related to childhood trauma  . Systemic lupus ([place])   . Uterine cancer [facility])     Past Surgical History:  Procedure Laterality Date  . ABDOMINAL HYSTERECTOMY  1976   no oopherectomy  . APPENDECTOMY    . BACK SURGERY    . BREAST IMPLANT REMOVAL Bilateral    Silicone allergy  . BREAST SURGERY Bilateral 1991   Mastectomy  . CATARACT EXTRACTION W/PHACO Right 10/30/2013   Procedure: CATARACT EXTRACTION PHACO AND INTRAOCULAR LENS PLACEMENT (IOC);  Surgeon: Tonny Branch, MD;  Location: [facility];  Service: Ophthalmology;  Laterality: Right;  CDE:10.13  . CATARACT EXTRACTION W/PHACO Left 11/10/2013   Procedure: CATARACT EXTRACTION PHACO AND INTRAOCULAR LENS PLACEMENT (IOC);  Surgeon: Tonny Branch, MD;  Location: [facility];  Service: Ophthalmology;  Laterality: Left;  CDE 9.71  . DILATION AND CURETTAGE OF UTERUS    . EYE SURGERY Bilateral 2015   Cataract - Dr Geoffry Paradise in [place]  . JOINT REPLACEMENT Left 2005   Knee x 2  . MASTECTOMY Bilateral   . PLACEMENT OF BREAST IMPLANTS Bilateral    Secondary to cancer  . RECONSTRUCTION / CORRECTION OF NIPPLE / AEROLA Bilateral   . TONSILLECTOMY    . WISDOM TOOTH EXTRACTION Bilateral    Extraction  x 4  . WRIST SURGERY Right 20's   patient not exactly sure of reason    There were no vitals filed for this visit.      Subjective Assessment - 01/22/17 1435    Subjective Patient reported no improvement, patient got a letter that denied therapy and she wants to be on hold   Patient Stated Goals Regain use of right UE.   Currently in Pain? Yes   Pain Score 7    Pain Location Shoulder   Pain Orientation Right   Pain Descriptors / Indicators Spasm   Pain Type Surgical pain   Pain Onset More than a month ago   Pain Frequency Intermittent   Aggravating Factors  any movement   Pain Relieving Factors at rest                         [facility] Adult PT Treatment/Exercise - 01/22/17 0001      Shoulder Exercises: Seated   Elevation Strengthening;AROM;Both;10 reps   Retraction Strengthening;Both;20 reps   External Rotation Strengthening;Right;AROM;Limitations  10-15 over 54min to perform     Shoulder Exercises: Prone   Other Prone Exercises kneeling rows and ext 10-20 each about 10 min to perform exercise     Shoulder Exercises: Pulleys   Flexion Other (comment)  61min  Moist Heat Therapy   Number Minutes Moist Heat 15 Minutes   Moist Heat Location Shoulder     Electrical Stimulation   Electrical Stimulation Location rt shoulder   Electrical Stimulation Action premod   Electrical Stimulation Parameters 80-150hz  x43min   Electrical Stimulation Goals Pain                PT Education - 01/22/17 1452    Education provided Yes   Education Details HEP   Person(s) Educated Patient   Methods Explanation;Demonstration;Handout   Comprehension Verbalized understanding;Returned demonstration             PT Long Term Goals - 01/11/17 1414      PT LONG TERM GOAL #1   Title Independent with an HEP.   Time 4   Period Weeks   Status Achieved     PT LONG TERM GOAL #2   Title Active right shoulder flexion to 145 degrees so the patient can easily reach  overhead.   Time 4   Period Weeks   Status On-going  AROM 80 degrees 01/11/17     PT LONG TERM GOAL #3   Title Active ER to 70 degrees+ to allow for easily donning/doffing of apparel   Time 4   Period Weeks   Status Achieved  AROM 70 degrees 01/11/17     PT LONG TERM GOAL #4   Title Full right elbow extension.   Time 4   Period Weeks   Status Achieved     PT LONG TERM GOAL #5   Title Perform ADL's with pain not > 3/10.   Time 4   Period Weeks   Status On-going               Plan - 01/22/17 1513    Clinical Impression Statement Patient tolerated treatment fair today with ongoing reported pain and spasms. Patient feels little to no improvement due to pain/spasms. Patient reported she wants to be put on hold due to financial reasons. Patient was educated on exercises and progression with HEP today. Patient unable to meet any further goals due to pain and strength defcits.    Rehab Potential Good   Clinical Impairments Affecting Rehab Potential surgery 08/07/16 - 23 weeks current 01/14/17    PT Frequency 2x / week   PT Duration 4 weeks   PT Treatment/Interventions ADLs/Self Care Home Management;Electrical Stimulation;Moist Heat;Ultrasound;Patient/family education;Therapeutic exercise;Therapeutic activities;Passive range of motion   PT Next Visit Plan on hold   Consulted and Agree with Plan of Care Patient      Patient will benefit from skilled therapeutic intervention in order to improve the following deficits and impairments:  Pain, Decreased activity tolerance, Decreased range of motion  Visit Diagnosis: Acute pain of left shoulder  Stiffness of left shoulder, not elsewhere classified  Chronic right shoulder pain  Stiffness of right shoulder, not elsewhere classified     Problem List Patient Active Problem List   Diagnosis Date Noted  . Traumatic partial tear of right biceps tendon 06/29/2016  . Spondylosis of cervical region without myelopathy or  radiculopathy 06/29/2016  . Systemic lupus ([place]) 09/02/2014  . Hyperlipidemia 09/02/2014  . Hypothyroidism 04/09/2013  . Depression 04/09/2013  . GAD (generalized anxiety disorder) 04/09/2013    Moses Odoherty P, PTA 01/22/2017, 3:41 PM  [facility] [place], [place], [address] Phone: [phone]   Fax:  [phone]  Name: Kimberly Roth MRN: [id number] Date of Birth: [birth year]

## 2017-01-25 ENCOUNTER — Encounter: Payer: Self-pay | Admitting: Physical Therapy

## 2017-01-29 ENCOUNTER — Encounter: Payer: Self-pay | Admitting: Physical Therapy

## 2017-01-29 ENCOUNTER — Ambulatory Visit: Payer: Worker's Compensation | Attending: Orthopaedic Surgery | Admitting: Physical Therapy

## 2017-01-29 DIAGNOSIS — M25511 Pain in right shoulder: Secondary | ICD-10-CM | POA: Diagnosis present

## 2017-01-29 DIAGNOSIS — M25612 Stiffness of left shoulder, not elsewhere classified: Secondary | ICD-10-CM | POA: Diagnosis present

## 2017-01-29 DIAGNOSIS — M25512 Pain in left shoulder: Secondary | ICD-10-CM | POA: Diagnosis present

## 2017-01-29 DIAGNOSIS — M25611 Stiffness of right shoulder, not elsewhere classified: Secondary | ICD-10-CM | POA: Insufficient documentation

## 2017-01-29 DIAGNOSIS — G8929 Other chronic pain: Secondary | ICD-10-CM | POA: Insufficient documentation

## 2017-01-29 NOTE — Therapy (Signed)
[facility] [place], [place], [address] Phone: [phone]   Fax:  [phone]  Physical Therapy Treatment  Patient Details  Name: Kimberly Roth MRN: [id number] Date of Birth: [birth year] Referring Provider: Rodell Perna MD.  Encounter Date: 01/29/2017      PT End of Session - 01/29/17 1423    Visit Number 35   Date for PT Re-Evaluation 02/11/17   PT Start Time 8329   PT Stop Time 1916   PT Time Calculation (min) 46 min   Activity Tolerance Patient limited by pain;Patient tolerated treatment well   Behavior During Therapy [facility] for tasks assessed/performed      Past Medical History:  Diagnosis Date  . Anxiety   . Breast cancer ([place])    breast bilateral  . Cataract   . History of shingles   . Hyperlipidemia   . PTSD (post-traumatic stress disorder)    related to childhood trauma  . Systemic lupus ([place])   . Uterine cancer [facility])     Past Surgical History:  Procedure Laterality Date  . ABDOMINAL HYSTERECTOMY  1976   no oopherectomy  . APPENDECTOMY    . BACK SURGERY    . BREAST IMPLANT REMOVAL Bilateral    Silicone allergy  . BREAST SURGERY Bilateral 1991   Mastectomy  . CATARACT EXTRACTION W/PHACO Right 10/30/2013   Procedure: CATARACT EXTRACTION PHACO AND INTRAOCULAR LENS PLACEMENT (IOC);  Surgeon: Tonny Branch, MD;  Location: [facility];  Service: Ophthalmology;  Laterality: Right;  CDE:10.13  . CATARACT EXTRACTION W/PHACO Left 11/10/2013   Procedure: CATARACT EXTRACTION PHACO AND INTRAOCULAR LENS PLACEMENT (IOC);  Surgeon: Tonny Branch, MD;  Location: [facility];  Service: Ophthalmology;  Laterality: Left;  CDE 9.71  . DILATION AND CURETTAGE OF UTERUS    . EYE SURGERY Bilateral 2015   Cataract - Dr Geoffry Paradise in [place]  . JOINT REPLACEMENT Left 2005   Knee x 2  . MASTECTOMY Bilateral   . PLACEMENT OF BREAST IMPLANTS Bilateral    Secondary to cancer  . RECONSTRUCTION / CORRECTION OF NIPPLE / AEROLA Bilateral   .  TONSILLECTOMY    . WISDOM TOOTH EXTRACTION Bilateral    Extraction x 4  . WRIST SURGERY Right 20's   patient not exactly sure of reason    There were no vitals filed for this visit.      Subjective Assessment - 01/29/17 1402    Subjective Patient reported today was last day, no improvement with pain and spasms   Patient Stated Goals Regain use of right UE.   Currently in Pain? Yes   Pain Score 7    Pain Location Shoulder   Pain Orientation Right   Pain Descriptors / Indicators Spasm   Pain Type Surgical pain   Pain Onset More than a month ago   Aggravating Factors  certain movements   Pain Relieving Factors at rest            [facility] PT Assessment - 01/29/17 0001      AROM   AROM Assessment Site Shoulder   Right/Left Shoulder Right   Right Shoulder Flexion 110 Degrees   Right Shoulder External Rotation 70 Degrees     PROM   PROM Assessment Site Shoulder;Elbow   Right/Left Shoulder Right   Right Shoulder Flexion 145 Degrees   Right Shoulder External Rotation 75 Degrees   Right/Left Elbow Right   Right Elbow Extension 0  [place] Adult PT Treatment/Exercise - 01/29/17 0001      Knee/Hip Exercises: Standing   Other Standing Knee Exercises ball on table for flexion/circles x fatigue   Other Standing Knee Exercises eccentic contol for flexion using yellow t-band x5     Shoulder Exercises: Seated   External Rotation Strengthening;Right;AROM;Limitations  used table 90/90 degree small range     Shoulder Exercises: Prone   Other Prone Exercises kneeling rows and ext 10-20 each about 10 min to perform exercise     Shoulder Exercises: Pulleys   Flexion Other (comment)  67mn     Moist Heat Therapy   Number Minutes Moist Heat 15 Minutes   Moist Heat Location Shoulder     Electrical Stimulation   Electrical Stimulation Location rt shoulder   Electrical Stimulation Action premod   Electrical Stimulation Parameters 80-'150hz'$ x173m    Electrical Stimulation Goals Pain                     PT Long Term Goals - 01/29/17 1425      PT LONG TERM GOAL #1   Title Independent with an HEP.   Time 4   Period Weeks   Status Achieved     PT LONG TERM GOAL #2   Title Active right shoulder flexion to 145 degrees so the patient can easily reach overhead.   Time 4   Period Weeks   Status Not Met  AROM 110 degrees 01/29/17     PT LONG TERM GOAL #3   Title Active ER to 70 degrees+ to allow for easily donning/doffing of apparel   Time 4   Period Weeks   Status Achieved     PT LONG TERM GOAL #4   Title Full right elbow extension.   Period Weeks   Status Achieved     PT LONG TERM GOAL #5   Title Perform ADL's with pain not > 3/10.   Period Weeks   Status Not Met  7/10 with activity 01/29/17               Plan - 01/29/17 1426    Clinical Impression Statement Patient tolerated treatment with reported pain and spasms in right shoulder. Patient unable to complete all exercises fully due to limitations with pain. Patient able to lift shoulder actively without shoulder compensations up to 110 degrees. Patient has full PROM in supine. Patient met all goals except #2 and #5 due to pain and active flexion deficts.  Educated patient on self progression for supine active ROM then progress to sidelying then standing. Patient also understand other exercises performed today and to continue at home with wall slides, prone/kneeling rows and gentle progression when able.   Rehab Potential Good   PT Frequency 2x / week   PT Duration 4 weeks   PT Treatment/Interventions ADLs/Self Care Home Management;Electrical Stimulation;Moist Heat;Ultrasound;Patient/family education;Therapeutic exercise;Therapeutic activities;Passive range of motion   PT Next Visit Plan DC   Consulted and Agree with Plan of Care Patient      Patient will benefit from skilled therapeutic intervention in order to improve the following deficits and  impairments:  Pain, Decreased activity tolerance, Decreased range of motion  Visit Diagnosis: Acute pain of left shoulder  Stiffness of left shoulder, not elsewhere classified  Chronic right shoulder pain  Stiffness of right shoulder, not elsewhere classified     Problem List Patient Active Problem List   Diagnosis Date Noted  . Traumatic partial tear of right biceps tendon 06/29/2016  .  Spondylosis of cervical region without myelopathy or radiculopathy 06/29/2016  . Systemic lupus ([place]) 09/02/2014  . Hyperlipidemia 09/02/2014  . Hypothyroidism 04/09/2013  . Depression 04/09/2013  . GAD (generalized anxiety disorder) 04/09/2013   Ladean Raya, PTA 01/29/17 5:58 PM  [facility] [place], [place], [address] Phone: [phone]   Fax:  [phone]  Name: Kimberly Roth MRN: [id number] Date of Birth: 1946-01-25  PHYSICAL THERAPY DISCHARGE SUMMARY  Visits from Start of Care: 35.  Current functional level related to goals / functional outcomes: See above.   Remaining deficits: Please refer to goal section.     Education / Equipment: HEP.  Plan: Patient agrees to discharge.  Patient goals were not met. Patient is being discharged due to lack of progress.  ?????         Mali Applegate MPT

## 2017-01-30 ENCOUNTER — Encounter: Payer: Self-pay | Admitting: Physical Therapy

## 2017-02-01 ENCOUNTER — Ambulatory Visit (INDEPENDENT_AMBULATORY_CARE_PROVIDER_SITE_OTHER): Payer: Medicare Other | Admitting: Orthopaedic Surgery

## 2017-02-08 ENCOUNTER — Encounter (INDEPENDENT_AMBULATORY_CARE_PROVIDER_SITE_OTHER): Payer: Self-pay | Admitting: Orthopaedic Surgery

## 2017-02-08 ENCOUNTER — Ambulatory Visit (INDEPENDENT_AMBULATORY_CARE_PROVIDER_SITE_OTHER): Payer: Worker's Compensation | Admitting: Orthopaedic Surgery

## 2017-02-08 VITALS — BP 109/85 | HR 93

## 2017-02-08 DIAGNOSIS — S46211S Strain of muscle, fascia and tendon of other parts of biceps, right arm, sequela: Secondary | ICD-10-CM

## 2017-02-08 NOTE — Progress Notes (Signed)
Office Visit Note   Patient: Kimberly Roth           Date of Birth: [birth year]           MRN: [id number] Visit Date: 02/08/2017              Requested by: [name], [place], [place] [address] PCP: [name], FNP   Assessment & Plan: Visit Diagnoses:  1. Traumatic partial tear of right biceps tendon, sequela       With the biceps tenodesis and adhesive capsulitis  Plan:  Patient given slip continued to work for 5 weeks all check in 5 weeks to make a decision about workers option versus FCE at that time. 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 Follow-Up Instructions: Return in about 5 weeks (around 03/15/2017).   Orders:  No orders of the defined types were placed in this encounter.  No orders of the defined types were placed in this encounter.     Procedures: No procedures performed   Clinical Data: No additional findings.   Subjective: Chief Complaint  Patient presents with  . Right Shoulder - Follow-up    HPI patient returns post right shoulder biceps tenodesis with some adhesive capsulitis. Her therapy progress is been very slow since there was a delay for many months until she got the appropriate surgical treatment. She was in a sling for 14 months before surgery and then in the sling for a couple months after surgery she started rehabilitation. She now is out of the sling she can get her hand to the top of her head sheet and wall walk and reach her hand above her head height. She still has considerable weakness she's dropped a glass of tea sometimes drops coffee months she's done multiple exercises using stretchy bands, football, pulley at home, putty squeezing, using the stick or broom, etc.  Review of Systems 14 part review of systems updated and is unchanged. She is out of her sling doing her home exercises.   Objective: Vital Signs: BP 109/85   Pulse 93   Physical  Exam  Constitutional: She is oriented to person, place, and time. She appears well-developed.  HENT:  Head: Normocephalic.  Right Ear: External ear normal.  Left Ear: External ear normal.  Eyes: Pupils are equal, round, and reactive to light.  Neck: No tracheal deviation present. No thyromegaly present.  Cardiovascular: Normal rate.   Pulmonary/Chest: Effort normal.  Abdominal: Soft.  Musculoskeletal:  Patient can reach the top of her head with the right arm. Biceps tenodesis incision is well-healed. She still has weakness of the shoulder internal/external rotators and the deltoid atrophy. Median nerve forearm is normal elbow reaches full extension. Good cervical range of motion. No brachial plexus tenderness.  Neurological: She is alert and oriented to person, place, and time.  Skin: Skin is warm and dry.  Psychiatric: She has a normal mood and affect. Her behavior is normal.    Ortho Exam  Specialty Comments:  No specialty comments available.  Imaging: No results found.   PMFS History: Patient Active Problem List   Diagnosis Date Noted  . Traumatic partial tear of right biceps tendon 06/29/2016  . Spondylosis of cervical region without myelopathy or radiculopathy 06/29/2016  . Systemic lupus ([place]) 09/02/2014  . Hyperlipidemia 09/02/2014  . Hypothyroidism 04/09/2013  . Depression 04/09/2013  . GAD (generalized anxiety disorder) 04/09/2013   Past Medical  History:  Diagnosis Date  . Anxiety   . Breast cancer ([place])    breast bilateral  . Cataract   . History of shingles   . Hyperlipidemia   . PTSD (post-traumatic stress disorder)    related to childhood trauma  . Systemic lupus ([place])   . Uterine cancer ([place])     Family History  Problem Relation Age of Onset  . Heart failure Father   . Heart disease Father   . Heart attack Father 32  . Mental illness Mother   . Mental illness Sister        PTSD  . Osteoporosis Paternal Grandmother     Past Surgical History:    Procedure Laterality Date  . ABDOMINAL HYSTERECTOMY  1976   no oopherectomy  . APPENDECTOMY    . BACK SURGERY    . BREAST IMPLANT REMOVAL Bilateral    Silicone allergy  . BREAST SURGERY Bilateral 1991   Mastectomy  . CATARACT EXTRACTION W/PHACO Right 10/30/2013   Procedure: CATARACT EXTRACTION PHACO AND INTRAOCULAR LENS PLACEMENT (IOC);  Surgeon: Tonny Branch, MD;  Location: [facility];  Service: Ophthalmology;  Laterality: Right;  CDE:10.13  . CATARACT EXTRACTION W/PHACO Left 11/10/2013   Procedure: CATARACT EXTRACTION PHACO AND INTRAOCULAR LENS PLACEMENT (IOC);  Surgeon: Tonny Branch, MD;  Location: [facility];  Service: Ophthalmology;  Laterality: Left;  CDE 9.71  . DILATION AND CURETTAGE OF UTERUS    . EYE SURGERY Bilateral 2015   Cataract - Dr Geoffry Paradise in [place]  . JOINT REPLACEMENT Left 2005   Knee x 2  . MASTECTOMY Bilateral   . PLACEMENT OF BREAST IMPLANTS Bilateral    Secondary to cancer  . RECONSTRUCTION / CORRECTION OF NIPPLE / AEROLA Bilateral   . TONSILLECTOMY    . WISDOM TOOTH EXTRACTION Bilateral    Extraction x 4  . WRIST SURGERY Right 20's   patient not exactly sure of reason   Social History   Occupational History  . Not on file.   Social History Main Topics  . Smoking status: Never Smoker  . Smokeless tobacco: Never Used  . Alcohol use 0.6 oz/week    1 Glasses of wine per week     Comment: 1 or 2 times per year  . Drug use: No  . Sexual activity: Yes

## 2017-02-09 ENCOUNTER — Ambulatory Visit (INDEPENDENT_AMBULATORY_CARE_PROVIDER_SITE_OTHER): Payer: Medicare Other | Admitting: Nurse Practitioner

## 2017-02-09 ENCOUNTER — Encounter: Payer: Self-pay | Admitting: Nurse Practitioner

## 2017-02-09 VITALS — BP 130/83 | HR 82 | Temp 97.4°F | Ht 64.0 in | Wt 132.0 lb

## 2017-02-09 DIAGNOSIS — E034 Atrophy of thyroid (acquired): Secondary | ICD-10-CM

## 2017-02-09 DIAGNOSIS — Z1159 Encounter for screening for other viral diseases: Secondary | ICD-10-CM

## 2017-02-09 NOTE — Progress Notes (Signed)
   Subjective:    Patient ID: Kimberly Roth, female    DOB: 13-Apr-1946, 71 y.o.   MRN: [id number]  HPI Patient in the office for recheck of her hypothyroidism. TSH was .0012 At last visit and levothyroxine was decreased to 149mcg daily. Patient is having her labs redrawn today.  She is interested in being tested for hepatitis C at this visit if possible.    Review of Systems  Constitutional: Positive for fatigue. Negative for activity change and appetite change.  Endocrine: Positive for cold intolerance.  All other systems reviewed and are negative.      Objective:   Physical Exam  Constitutional: She is oriented to person, place, and time. She appears well-developed and well-nourished. No distress.  HENT:  Head: Normocephalic.  Neck: Normal range of motion. Neck supple. No thyromegaly present.  Cardiovascular: Normal rate, regular rhythm and normal heart sounds.   No murmur heard. Pulmonary/Chest: Effort normal and breath sounds normal. No respiratory distress. She has no wheezes.  Neurological: She is alert and oriented to person, place, and time.  Skin: Skin is warm and dry.  Psychiatric: She has a normal mood and affect. Her behavior is normal. Judgment and thought content normal.   BP 130/83   Pulse 82   Temp (!) 97.4 F (36.3 C) (Oral)   Ht 5\' 4"  (1.626 m)   Wt 132 lb (59.9 kg)   BMI 22.66 kg/m      Assessment & Plan:  1. Hypothyroidism due to acquired atrophy of thyroid Will let patient know lab results - Thyroid Panel With TSH  2. Need for hepatitis C screening test Pending results - Hepatitis C antibody   [name] Done, FNP

## 2017-02-09 NOTE — Patient Instructions (Signed)
Hypothyroidism Hypothyroidism is a disorder of the thyroid. The thyroid is a large gland that is located in the lower front of the neck. The thyroid releases hormones that control how the body works. With hypothyroidism, the thyroid does not make enough of these hormones. What are the causes? Causes of hypothyroidism may include:  Viral infections.  Pregnancy.  Your own defense system (immune system) attacking your thyroid.  Certain medicines.  Birth defects.  Past radiation treatments to your head or neck.  Past treatment with radioactive iodine.  Past surgical removal of part or all of your thyroid.  Problems with the gland that is located in the center of your brain (pituitary).  What are the signs or symptoms? Signs and symptoms of hypothyroidism may include:  Feeling as though you have no energy (lethargy).  Inability to tolerate cold.  Weight gain that is not explained by a change in diet or exercise habits.  Dry skin.  Coarse hair.  Menstrual irregularity.  Slowing of thought processes.  Constipation.  Sadness or depression.  How is this diagnosed? Your health care provider may diagnose hypothyroidism with blood tests and ultrasound tests. How is this treated? Hypothyroidism is treated with medicine that replaces the hormones that your body does not make. After you begin treatment, it may take several weeks for symptoms to go away. Follow these instructions at home:  Take medicines only as directed by your health care provider.  If you start taking any new medicines, tell your health care provider.  Keep all follow-up visits as directed by your health care provider. This is important. As your condition improves, your dosage needs may change. You will need to have blood tests regularly so that your health care provider can watch your condition. Contact a health care provider if:  Your symptoms do not get better with treatment.  You are taking thyroid  replacement medicine and: ? You sweat excessively. ? You have tremors. ? You feel anxious. ? You lose weight rapidly. ? You cannot tolerate heat. ? You have emotional swings. ? You have diarrhea. ? You feel weak. Get help right away if:  You develop chest pain.  You develop an irregular heartbeat.  You develop a rapid heartbeat. This information is not intended to replace advice given to you by your health care provider. Make sure you discuss any questions you have with your health care provider. Document Released: 07/17/2005 Document Revised: 12/23/2015 Document Reviewed: 12/02/2013 Elsevier Interactive Patient Education  2017 Elsevier Inc.  

## 2017-02-10 LAB — THYROID PANEL WITH TSH
FREE THYROXINE INDEX: 3.6 (ref 1.2–4.9)
T3 UPTAKE RATIO: 33 % (ref 24–39)
T4 TOTAL: 11 ug/dL (ref 4.5–12.0)
TSH: 0.022 u[IU]/mL — AB (ref 0.450–4.500)

## 2017-02-10 LAB — HEPATITIS C ANTIBODY: Hep C Virus Ab: 0.1 s/co ratio (ref 0.0–0.9)

## 2017-02-12 ENCOUNTER — Other Ambulatory Visit: Payer: Self-pay | Admitting: Nurse Practitioner

## 2017-02-12 ENCOUNTER — Telehealth: Payer: Self-pay | Admitting: Nurse Practitioner

## 2017-02-12 DIAGNOSIS — E034 Atrophy of thyroid (acquired): Secondary | ICD-10-CM

## 2017-02-12 DIAGNOSIS — F32A Depression, unspecified: Secondary | ICD-10-CM

## 2017-02-12 DIAGNOSIS — F329 Major depressive disorder, single episode, unspecified: Secondary | ICD-10-CM

## 2017-02-12 MED ORDER — LEVOTHYROXINE SODIUM 125 MCG PO TABS: 125.0000 ug | ORAL_TABLET | Freq: Every day | ORAL | 11 refills | Status: DC

## 2017-02-12 NOTE — Telephone Encounter (Signed)
NA [id number]/16-jhb

## 2017-02-12 NOTE — Telephone Encounter (Signed)
Hypothyroidism explained to patient.

## 2017-02-13 NOTE — Telephone Encounter (Signed)
last seen and filled on 12/29/16. Call in

## 2017-02-19 ENCOUNTER — Telehealth (INDEPENDENT_AMBULATORY_CARE_PROVIDER_SITE_OTHER): Payer: Self-pay | Admitting: Orthopaedic Surgery

## 2017-02-19 NOTE — Telephone Encounter (Signed)
I called to advise patient that Dr. Lorin Mercy is in surgery today and that I would send him the message and call her back as soon as I have an answer for her. She asked me to disregard the message because Dr. Lorin Mercy has already told her that it is not in her best interest to continue Tramadol. I did advise patient that I was going to sign off on message and asked her to return call if she needed anything further. She expressed understanding.

## 2017-02-19 NOTE — Telephone Encounter (Signed)
PT REQUESTS REFILL OF TRAMADOL PLEASE.  [phone]

## 2017-02-26 ENCOUNTER — Telehealth (INDEPENDENT_AMBULATORY_CARE_PROVIDER_SITE_OTHER): Payer: Self-pay | Admitting: Orthopaedic Surgery

## 2017-02-26 NOTE — Telephone Encounter (Signed)
Returned call to patient no answer and no answering machine pickup  [phone]

## 2017-03-07 DIAGNOSIS — L250 Unspecified contact dermatitis due to cosmetics: Secondary | ICD-10-CM | POA: Diagnosis not present

## 2017-03-07 DIAGNOSIS — D225 Melanocytic nevi of trunk: Secondary | ICD-10-CM | POA: Diagnosis not present

## 2017-03-22 ENCOUNTER — Ambulatory Visit (INDEPENDENT_AMBULATORY_CARE_PROVIDER_SITE_OTHER): Payer: Medicare Other | Admitting: Orthopaedic Surgery

## 2017-03-22 ENCOUNTER — Telehealth (INDEPENDENT_AMBULATORY_CARE_PROVIDER_SITE_OTHER): Payer: Self-pay | Admitting: Orthopaedic Surgery

## 2017-03-22 NOTE — Telephone Encounter (Signed)
Returned called to  patient no answer and no answering machine pickup [phone]

## 2017-03-23 ENCOUNTER — Other Ambulatory Visit: Payer: Self-pay | Admitting: Nurse Practitioner

## 2017-03-23 DIAGNOSIS — F329 Major depressive disorder, single episode, unspecified: Secondary | ICD-10-CM

## 2017-03-23 DIAGNOSIS — F32A Depression, unspecified: Secondary | ICD-10-CM

## 2017-03-26 NOTE — Telephone Encounter (Signed)
Last seen 02/09/17  MMM If approved route to nurse to call into [facility]

## 2017-03-27 NOTE — Telephone Encounter (Signed)
[facility] called [facility] Okayed per MMM

## 2017-03-29 ENCOUNTER — Ambulatory Visit (INDEPENDENT_AMBULATORY_CARE_PROVIDER_SITE_OTHER): Payer: Worker's Compensation | Admitting: Orthopaedic Surgery

## 2017-03-29 ENCOUNTER — Encounter (INDEPENDENT_AMBULATORY_CARE_PROVIDER_SITE_OTHER): Payer: Self-pay | Admitting: Orthopaedic Surgery

## 2017-03-29 VITALS — BP 145/90 | HR 85

## 2017-03-29 DIAGNOSIS — S46211S Strain of muscle, fascia and tendon of other parts of biceps, right arm, sequela: Secondary | ICD-10-CM

## 2017-03-29 NOTE — Progress Notes (Signed)
Office Visit Note   Patient: Kimberly Roth           Date of Birth: 12/22/1945           MRN: [id number] Visit Date: 03/29/2017              Requested by: [name], [address], [place] [address] PCP: [name], FNP   Assessment & Plan: Visit Diagnoses:  1. Traumatic partial tear of right biceps tendon, sequela     Plan: I would recommend proceeding with an FCE. I can see her back after the test is performed. Plan would be work resumption in line with findings of the FCE.  Follow-Up Instructions: No Follow-up on file.   Orders:  No orders of the defined types were placed in this encounter.  No orders of the defined types were placed in this encounter.     Procedures: No procedures performed   Clinical Data: No additional findings.   Subjective: Chief Complaint  Patient presents with  . Right Shoulder - Follow-up    HPI   patient is now 7 months out from surgery on right shoulder with biceps tenodesis for partial tear of the right biceps tendon. 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 She can lift her arm better than she previously could. She was in a sling for over a year but is now out of the sling.   Review of Systems updated and unchanged from last office visit   Objective: Vital Signs: BP (!) 145/90   Pulse 85   Physical Exam  Constitutional: She is oriented to person, place, and time. She appears well-developed.  HENT:  Head: Normocephalic.  Right Ear: External ear normal.  Left Ear: External ear normal.  Eyes: Pupils are equal, round, and reactive to light.  Neck: No tracheal deviation present. No thyromegaly present.  Cardiovascular: Normal rate.   Pulmonary/Chest: Effort normal.  Abdominal: Soft.  Musculoskeletal:  Arthroscopic portals are well-healed.  Biceps tenodesis incision is well-healed. No distal by Shirlee Limerick in the biceps muscle elbow range of motion is good. She can reach her and her mouth but has trouble still fixing her hair and states her husband has been doing this. Sensation the hand is normal fingertips flexion to distal palmar crease.  Neurological: She is alert and oriented to person, place, and time.  Skin: Skin is warm and dry.  Psychiatric: She has a normal mood and affect. Her behavior is normal.    Ortho Exam  Specialty Comments:  No specialty comments available.  Imaging: No results found.   PMFS History: Patient Active Problem List   Diagnosis Date Noted  . Traumatic partial tear of right biceps tendon 06/29/2016  . Spondylosis of cervical region without myelopathy or radiculopathy 06/29/2016  . Systemic lupus ([place]) 09/02/2014  . Hyperlipidemia 09/02/2014  . Hypothyroidism 04/09/2013  . Depression 04/09/2013  . GAD (generalized anxiety disorder) 04/09/2013   Past Medical History:  Diagnosis Date  . Anxiety   . Breast cancer ([place])    breast bilateral  . Cataract   . History of shingles   . Hyperlipidemia   . PTSD (post-traumatic stress disorder)    related to childhood trauma  . Systemic lupus ([place])   . Uterine cancer ([place])     Family History  Problem Relation Age of  Onset  . Heart failure Father   . Heart disease Father   . Heart attack Father 36  . Mental illness Mother   . Mental illness Sister        PTSD  . Osteoporosis Paternal Grandmother     Past Surgical History:  Procedure Laterality Date  . ABDOMINAL HYSTERECTOMY  1976   no oopherectomy  . APPENDECTOMY    . BACK SURGERY    . BREAST IMPLANT REMOVAL Bilateral    Silicone allergy  . BREAST SURGERY Bilateral 1991   Mastectomy  . CATARACT EXTRACTION W/PHACO Right 10/30/2013   Procedure: CATARACT EXTRACTION PHACO AND INTRAOCULAR LENS PLACEMENT (IOC);  Surgeon: Tonny Branch, MD;  Location: [facility];  Service: Ophthalmology;  Laterality:  Right;  CDE:10.13  . CATARACT EXTRACTION W/PHACO Left 11/10/2013   Procedure: CATARACT EXTRACTION PHACO AND INTRAOCULAR LENS PLACEMENT (IOC);  Surgeon: Tonny Branch, MD;  Location: [facility];  Service: Ophthalmology;  Laterality: Left;  CDE 9.71  . DILATION AND CURETTAGE OF UTERUS    . EYE SURGERY Bilateral 2015   Cataract - Dr Geoffry Paradise in [place]  . JOINT REPLACEMENT Left 2005   Knee x 2  . MASTECTOMY Bilateral   . PLACEMENT OF BREAST IMPLANTS Bilateral    Secondary to cancer  . RECONSTRUCTION / CORRECTION OF NIPPLE / AEROLA Bilateral   . TONSILLECTOMY    . WISDOM TOOTH EXTRACTION Bilateral    Extraction x 4  . WRIST SURGERY Right 20's   patient not exactly sure of reason   Social History   Occupational History  . Not on file.   Social History Main Topics  . Smoking status: Never Smoker  . Smokeless tobacco: Never Used  . Alcohol use 0.6 oz/week    1 Glasses of wine per week     Comment: 1 or 2 times per year  . Drug use: No  . Sexual activity: Yes

## 2017-03-29 NOTE — Addendum Note (Signed)
Addended by: Meyer Cory on: 03/29/2017 03:26 PM   Modules accepted: Orders

## 2017-04-25 DIAGNOSIS — J029 Acute pharyngitis, unspecified: Secondary | ICD-10-CM | POA: Diagnosis not present

## 2017-05-15 ENCOUNTER — Ambulatory Visit: Payer: Self-pay | Admitting: Nurse Practitioner

## 2017-05-15 ENCOUNTER — Encounter: Payer: Self-pay | Admitting: Nurse Practitioner

## 2017-05-22 ENCOUNTER — Telehealth (INDEPENDENT_AMBULATORY_CARE_PROVIDER_SITE_OTHER): Payer: Self-pay | Admitting: *Deleted

## 2017-05-22 NOTE — Telephone Encounter (Signed)
Beth from PT hand specialist called stating she had received order for PT , she contacted the insurance and they are wanting an FCE order also for the therapy, Please fax FCE order to [facility] at [phone], Thanks

## 2017-05-22 NOTE — Telephone Encounter (Signed)
I saw that you had faxed Rx and info to [facility] on patient's referral for FCE after W/C approval. Are they also ordering PT? I was going to refax order for FCE, but was not sure what they had. Thanks.

## 2017-05-23 NOTE — Telephone Encounter (Signed)
RX for FCE faxed again

## 2017-05-28 ENCOUNTER — Other Ambulatory Visit: Payer: Self-pay | Admitting: Nurse Practitioner

## 2017-05-28 DIAGNOSIS — F3342 Major depressive disorder, recurrent, in full remission: Secondary | ICD-10-CM

## 2017-05-28 NOTE — Telephone Encounter (Signed)
t seen 02/09/17  MMM Requesting 90 day supply

## 2017-06-11 ENCOUNTER — Other Ambulatory Visit: Payer: Self-pay | Admitting: Nurse Practitioner

## 2017-06-11 DIAGNOSIS — F329 Major depressive disorder, single episode, unspecified: Secondary | ICD-10-CM

## 2017-06-11 DIAGNOSIS — F32A Depression, unspecified: Secondary | ICD-10-CM

## 2017-06-12 NOTE — Telephone Encounter (Signed)
Last seen 02/09/17  MMM

## 2017-07-17 ENCOUNTER — Other Ambulatory Visit: Payer: Self-pay | Admitting: Nurse Practitioner

## 2017-07-17 DIAGNOSIS — F32A Depression, unspecified: Secondary | ICD-10-CM

## 2017-07-17 DIAGNOSIS — F329 Major depressive disorder, single episode, unspecified: Secondary | ICD-10-CM

## 2017-07-17 NOTE — Telephone Encounter (Signed)
Last SeEn 02/09/17 MMM

## 2017-08-28 ENCOUNTER — Other Ambulatory Visit: Payer: Self-pay | Admitting: Nurse Practitioner

## 2017-08-28 DIAGNOSIS — F3342 Major depressive disorder, recurrent, in full remission: Secondary | ICD-10-CM

## 2017-09-23 ENCOUNTER — Other Ambulatory Visit: Payer: Self-pay | Admitting: Nurse Practitioner

## 2017-09-23 DIAGNOSIS — F32A Depression, unspecified: Secondary | ICD-10-CM

## 2017-09-23 DIAGNOSIS — F329 Major depressive disorder, single episode, unspecified: Secondary | ICD-10-CM

## 2017-09-24 NOTE — Telephone Encounter (Signed)
Last seen 02/09/17  MMM

## 2017-09-30 DIAGNOSIS — H1030 Unspecified acute conjunctivitis, unspecified eye: Secondary | ICD-10-CM | POA: Diagnosis not present

## 2017-10-02 ENCOUNTER — Encounter: Payer: Self-pay | Admitting: Nurse Practitioner

## 2017-10-02 ENCOUNTER — Ambulatory Visit (INDEPENDENT_AMBULATORY_CARE_PROVIDER_SITE_OTHER): Payer: Medicare Other | Admitting: Nurse Practitioner

## 2017-10-02 VITALS — BP 142/91 | HR 94 | Temp 97.9°F | Ht 64.0 in | Wt 135.4 lb

## 2017-10-02 DIAGNOSIS — F411 Generalized anxiety disorder: Secondary | ICD-10-CM | POA: Diagnosis not present

## 2017-10-02 DIAGNOSIS — R739 Hyperglycemia, unspecified: Secondary | ICD-10-CM

## 2017-10-02 DIAGNOSIS — E7800 Pure hypercholesterolemia, unspecified: Secondary | ICD-10-CM | POA: Diagnosis not present

## 2017-10-02 DIAGNOSIS — M47812 Spondylosis without myelopathy or radiculopathy, cervical region: Secondary | ICD-10-CM

## 2017-10-02 DIAGNOSIS — F3342 Major depressive disorder, recurrent, in full remission: Secondary | ICD-10-CM

## 2017-10-02 DIAGNOSIS — M329 Systemic lupus erythematosus, unspecified: Secondary | ICD-10-CM | POA: Diagnosis not present

## 2017-10-02 DIAGNOSIS — S46211S Strain of muscle, fascia and tendon of other parts of biceps, right arm, sequela: Secondary | ICD-10-CM

## 2017-10-02 DIAGNOSIS — F32A Depression, unspecified: Secondary | ICD-10-CM

## 2017-10-02 DIAGNOSIS — F329 Major depressive disorder, single episode, unspecified: Secondary | ICD-10-CM | POA: Diagnosis not present

## 2017-10-02 MED ORDER — CHLORDIAZEPOXIDE HCL 25 MG PO CAPS: ORAL_CAPSULE | ORAL | 5 refills | Status: DC

## 2017-10-02 MED ORDER — BUPROPION HCL ER (SR) 100 MG PO TB12: ORAL_TABLET | ORAL | 5 refills | Status: DC

## 2017-10-02 NOTE — Patient Instructions (Signed)
Stress and Stress Management Stress is a normal reaction to life events. It is what you feel when life demands more than you are used to or more than you can handle. Some stress can be useful. For example, the stress reaction can help you catch the last bus of the day, study for a test, or meet a deadline at work. But stress that occurs too often or for too long can cause problems. It can affect your emotional health and interfere with relationships and normal daily activities. Too much stress can weaken your immune system and increase your risk for physical illness. If you already have a medical problem, stress can make it worse. What are the causes? All sorts of life events may cause stress. An event that causes stress for one person may not be stressful for another person. Major life events commonly cause stress. These may be positive or negative. Examples include losing your job, moving into a new home, getting married, having a baby, or losing a loved one. Less obvious life events may also cause stress, especially if they occur day after day or in combination. Examples include working long hours, driving in traffic, caring for children, being in debt, or being in a difficult relationship. What are the signs or symptoms? Stress may cause emotional symptoms including, the following:  Anxiety. This is feeling worried, afraid, on edge, overwhelmed, or out of control.  Anger. This is feeling irritated or impatient.  Depression. This is feeling sad, down, helpless, or guilty.  Difficulty focusing, remembering, or making decisions.  Stress may cause physical symptoms, including the following:  Aches and pains. These may affect your head, neck, back, stomach, or other areas of your body.  Tight muscles or clenched jaw.  Low energy or trouble sleeping.  Stress may cause unhealthy behaviors, including the following:  Eating to feel better (overeating) or skipping meals.  Sleeping too little,  too much, or both.  Working too much or putting off tasks (procrastination).  Smoking, drinking alcohol, or using drugs to feel better.  How is this diagnosed? Stress is diagnosed through an assessment by your health care provider. Your health care provider will ask questions about your symptoms and any stressful life events.Your health care provider will also ask about your medical history and may order blood tests or other tests. Certain medical conditions and medicine can cause physical symptoms similar to stress. Mental illness can cause emotional symptoms and unhealthy behaviors similar to stress. Your health care provider may refer you to a mental health professional for further evaluation. How is this treated? Stress management is the recommended treatment for stress.The goals of stress management are reducing stressful life events and coping with stress in healthy ways. Techniques for reducing stressful life events include the following:  Stress identification. Self-monitor for stress and identify what causes stress for you. These skills may help you to avoid some stressful events.  Time management. Set your priorities, keep a calendar of events, and learn to say "no." These tools can help you avoid making too many commitments.  Techniques for coping with stress include the following:  Rethinking the problem. Try to think realistically about stressful events rather than ignoring them or overreacting. Try to find the positives in a stressful situation rather than focusing on the negatives.  Exercise. Physical exercise can release both physical and emotional tension. The key is to find a form of exercise you enjoy and do it regularly.  Relaxation techniques. These relax the body and  mind. Examples include yoga, meditation, tai chi, biofeedback, deep breathing, progressive muscle relaxation, listening to music, being out in nature, journaling, and other hobbies. Again, the key is to find  one or more that you enjoy and can do regularly.  Healthy lifestyle. Eat a balanced diet, get plenty of sleep, and do not smoke. Avoid using alcohol or drugs to relax.  Strong support network. Spend time with family, friends, or other people you enjoy being around.Express your feelings and talk things over with someone you trust.  Counseling or talktherapy with a mental health professional may be helpful if you are having difficulty managing stress on your own. Medicine is typically not recommended for the treatment of stress.Talk to your health care provider if you think you need medicine for symptoms of stress. Follow these instructions at home:  Keep all follow-up visits as directed by your health care provider.  Take all medicines as directed by your health care provider. Contact a health care provider if:  Your symptoms get worse or you start having new symptoms.  You feel overwhelmed by your problems and can no longer manage them on your own. Get help right away if:  You feel like hurting yourself or someone else. This information is not intended to replace advice given to you by your health care provider. Make sure you discuss any questions you have with your health care provider. Document Released: 01/10/2001 Document Revised: 12/23/2015 Document Reviewed: 03/11/2013 Elsevier Interactive Patient Education  2017 Elsevier Inc.  

## 2017-10-02 NOTE — Progress Notes (Signed)
Subjective:    Patient ID: Kimberly Roth, female    DOB: 1946-04-29, 72 y.o.   MRN: [id number]  HPI   Kimberly Roth is here today for follow up of chronic medical problem.  Outpatient Encounter Medications as of 10/02/2017  Medication Sig  . acetaminophen (TYLENOL) 325 MG tablet Take 650 mg by mouth every 4 (four) hours as needed.  . Ascorbic Acid (VITAMIN C) 1000 MG tablet Take 1,000 mg by mouth 2 (two) times daily.  Marland Kitchen aspirin 325 MG EC tablet Take 325 mg by mouth 2 (two) times daily.  Marland Kitchen BIOTIN PO Take 1 tablet by mouth 2 (two) times daily.  Marland Kitchen buPROPion (WELLBUTRIN SR) 100 MG 12 hr tablet TAKE ONE (1) TABLET THREE (3) TIMES EACH DAY  . calcium-vitamin D (OSCAL WITH D) 250-125 MG-UNIT per tablet Take 1 tablet by mouth 2 (two) times daily.  . chlordiazePOXIDE (LIBRIUM) 25 MG capsule TAKE 1 CAPSULE 3 TIMES DAILY AS NEEDED FOR ANXIETY  . cholecalciferol (VITAMIN D) 1000 UNITS tablet Take 1,000 Units by mouth 2 (two) times daily.  Marland Kitchen CRANBERRY EXTRACT PO Take 1 tablet by mouth 2 (two) times daily.  . diclofenac (VOLTAREN) 75 MG EC tablet Take 1 tablet (75 mg total) by mouth 2 (two) times daily. With food  . diphenhydrAMINE (BENADRYL) 25 MG tablet Take 50 mg by mouth at bedtime as needed for sleep.  Marland Kitchen ivermectin (STROMECTOL) 3 MG TABS tablet TAKE 1 TABLET EVERY MONTH  . levothyroxine (SYNTHROID) 125 MCG tablet Take 1 tablet (125 mcg total) by mouth daily.  . Multiple Vitamins-Minerals (MULTIVITAMINS THER. W/MINERALS) TABS tablet Take 1 tablet by mouth 2 (two) times daily.  . multivitamin-lutein (OCUVITE-LUTEIN) CAPS capsule Take 1 capsule by mouth 2 (two) times daily.  . Omega-3 Fatty Acids (OMEGA 3 PO) Take 1 capsule by mouth 2 (two) times daily.  . Potassium Gluconate 595 MG CAPS Take 1 capsule by mouth daily.  . Thiamine HCl (VITAMIN B-1) 250 MG tablet Take 250 mg by mouth 2 (two) times daily.  . traMADol (ULTRAM) 50 MG tablet Take one po q HS (Patient not taking: Reported on 03/29/2017)      1. Spondylosis of cervical region without myelopathy or radiculopathy  She has along history o neck pain. Says she tolerates it right now. She sees dr. Lorin Mercy  2. Traumatic partial tear of right biceps tendon, sequela  She has acute partial tear of biceps tenddon. She ha been seeing Dr.Yates and he has had her in PT after he did surgery on shoulder. According to office note she is doing well and healing nicely.  3. Recurrent major depressive disorder, in full remission ([place])  She is on librium and wellbutrin- combination is working well for her. Says she seems to be getting worse. She thinks it is because her husband just had back surgery and she is having to stay home and take care of him. Depression screen [facility] 2/9 10/02/2017 02/09/2017 12/29/2016  Decreased Interest 2 0 0  Down, Depressed, Hopeless 2 0 0  PHQ - 2 Score 4 0 0  Altered sleeping 2 - -  Tired, decreased energy 2 - -  Change in appetite 1 - -  Feeling bad or failure about yourself  0 - -  Trouble concentrating 0 - -  Moving slowly or fidgety/restless 0 - -  Suicidal thoughts 0 - -  PHQ-9 Score 9 - -  Difficult doing work/chores - - -     4. GAD (  generalized anxiety disorder)  currently only on librium and wellbutrin- doing well at this time  5. Pure hypercholesterolemia  tries to watch her diet most days. She stays very active taking care of her horses  6. Systemic lupus erythematosus, unspecified SLE type, unspecified organ involvement status ([place]) I do not see in chart that he has seen anyone recently. She ees [place] son in [place] who is a specialist in lupus. Currently not taking an meds for it. 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  HENT: Negative.   Eyes: Negative for pain.   Respiratory: Negative for shortness of breath.   Cardiovascular: Negative for chest pain, palpitations and leg swelling.  Gastrointestinal: Negative for abdominal pain.  Endocrine: Negative for polydipsia.  Genitourinary: Negative.   Musculoskeletal: Positive for arthralgias (right shoulder) and neck pain.  Skin: Negative for rash.  Neurological: Negative for dizziness, weakness and headaches.  Hematological: Does not bruise/bleed easily.  Psychiatric/Behavioral: Negative.   All other systems reviewed and are negative.      Objective:   Physical Exam  Constitutional: She is oriented to person, place, and time. She appears well-developed and well-nourished. No distress.  HENT:  Head: Normocephalic.  Right Ear: External ear normal.  Left Ear: External ear normal.  Nose: Nose normal.  Mouth/Throat: Oropharynx is clear and moist.  Neck: Normal range of motion. Neck supple.  Cardiovascular: Normal rate and regular rhythm.  Pulmonary/Chest: Effort normal and breath sounds normal.  Abdominal: Soft. She exhibits no distension. There is no tenderness.  Musculoskeletal:  Decreased ROM of right shoulder due to pain on abduction  Motor strength and sensatio distally intact  Neurological: She is alert and oriented to person, place, and time. She has normal reflexes.  Skin: Skin is warm.  Psychiatric: She has a normal mood and affect. Her behavior is normal. Judgment and thought content normal.   BP (!) 142/91   Pulse 94   Temp 97.9 F (36.6 C) (Oral)   Ht '5\' 4"'$  (1.626 m)   Wt 135 lb 6.4 oz (61.4 kg)   BMI 23.24 kg/m       Assessment & Plan:  1. Spondylosis of cervical region without myelopathy or radiculopathy Keep follow up with ortho  2. Traumatic partial tear of right biceps tendon, sequela  3. Recurrent major depressive disorder, in full remission ([place]) Stress management exercise - buPROPion (WELLBUTRIN SR) 100 MG 12 hr tablet; TAKE ONE (1) TABLET THREE (3) TIMES EACH  DAY  Dispense: 90 tablet; Refill: 5  4. GAD (generalized anxiety disorder)  5. Pure hypercholesterolemia Low fat diet - CMP14+EGFR - Lipid panel  6. Systemic lupus erythematosus, unspecified SLE type, unspecified organ involvement status ([place])  7. Depression, unspecified depression type - chlordiazePOXIDE (LIBRIUM) 25 MG capsule; TAKE 1 CAPSULE 3 TIMES DAILY AS NEEDED FOR ANXIETY  Dispense: 90 capsule; Refill: 5    Labs pending Health maintenance reviewed Diet and exercise encouraged Continue all meds Follow up  In 6 months    [place], FNP

## 2017-10-03 LAB — CMP14+EGFR
A/G RATIO: 1.8 (ref 1.2–2.2)
ALK PHOS: 69 IU/L (ref 39–117)
ALT: 8 IU/L (ref 0–32)
AST: 17 IU/L (ref 0–40)
Albumin: 4.4 g/dL (ref 3.5–4.8)
BUN/Creatinine Ratio: 12 (ref 12–28)
BUN: 11 mg/dL (ref 8–27)
Bilirubin Total: 0.3 mg/dL (ref 0.0–1.2)
CALCIUM: 9.7 mg/dL (ref 8.7–10.3)
CO2: 24 mmol/L (ref 20–29)
Chloride: 100 mmol/L (ref 96–106)
Creatinine, Ser: 0.93 mg/dL (ref 0.57–1.00)
GFR calc Af Amer: 72 mL/min/{1.73_m2} (ref >59)
GFR, EST NON AFRICAN AMERICAN: 62 mL/min/{1.73_m2} (ref >59)
Globulin, Total: 2.4 g/dL (ref 1.5–4.5)
Glucose: 111 mg/dL — ABNORMAL HIGH (ref 65–99)
POTASSIUM: 4.3 mmol/L (ref 3.5–5.2)
Sodium: 140 mmol/L (ref 134–144)
Total Protein: 6.8 g/dL (ref 6.0–8.5)

## 2017-10-03 LAB — LIPID PANEL
CHOL/HDL RATIO: 3.1 ratio (ref 0.0–4.4)
CHOLESTEROL TOTAL: 286 mg/dL — AB (ref 100–199)
HDL: 91 mg/dL (ref >39)
LDL Calculated: 180 mg/dL — ABNORMAL HIGH (ref 0–99)
TRIGLYCERIDES: 77 mg/dL (ref 0–149)
VLDL Cholesterol Cal: 15 mg/dL (ref 5–40)

## 2017-10-04 ENCOUNTER — Telehealth: Payer: Self-pay | Admitting: Nurse Practitioner

## 2017-10-04 ENCOUNTER — Other Ambulatory Visit: Payer: Self-pay | Admitting: Nurse Practitioner

## 2017-10-04 DIAGNOSIS — R739 Hyperglycemia, unspecified: Secondary | ICD-10-CM

## 2017-10-04 MED ORDER — ATORVASTATIN CALCIUM 40 MG PO TABS: 40.0000 mg | ORAL_TABLET | Freq: Every day | ORAL | 5 refills | Status: DC

## 2017-10-04 NOTE — Telephone Encounter (Signed)
Patient aware of results.

## 2017-10-04 NOTE — Addendum Note (Signed)
Addended by: [name] on: 10/04/2017 02:53 PM   Modules accepted: Orders

## 2017-10-04 NOTE — Telephone Encounter (Signed)
Patient aware.

## 2017-10-04 NOTE — Telephone Encounter (Signed)
Patient is willing to try a statin.

## 2017-10-04 NOTE — Telephone Encounter (Signed)
lipitor rx sent to pharmacy

## 2017-10-05 ENCOUNTER — Telehealth: Payer: Self-pay | Admitting: Nurse Practitioner

## 2017-10-05 MED ORDER — ONETOUCH VERIO W/DEVICE KIT: 1.0000 | PACK | Freq: Every day | 0 refills | Status: DC

## 2017-10-05 NOTE — Telephone Encounter (Signed)
Glucometer sent to pharmacy and patient aware

## 2017-10-08 DIAGNOSIS — R739 Hyperglycemia, unspecified: Secondary | ICD-10-CM | POA: Diagnosis not present

## 2017-10-08 LAB — BAYER DCA HB A1C WAIVED: HB A1C: 5 % (ref <7.0)

## 2017-10-08 NOTE — Addendum Note (Signed)
Addended by: Earlene Plater on: 10/08/2017 10:36 AM   Modules accepted: Orders

## 2017-10-15 DIAGNOSIS — H5711 Ocular pain, right eye: Secondary | ICD-10-CM | POA: Diagnosis not present

## 2017-10-26 ENCOUNTER — Ambulatory Visit: Payer: Self-pay

## 2017-11-21 ENCOUNTER — Other Ambulatory Visit: Payer: Self-pay | Admitting: Nurse Practitioner

## 2017-11-21 DIAGNOSIS — E034 Atrophy of thyroid (acquired): Secondary | ICD-10-CM

## 2017-12-21 ENCOUNTER — Ambulatory Visit: Payer: Self-pay | Admitting: Nurse Practitioner

## 2017-12-26 ENCOUNTER — Encounter: Payer: Self-pay | Admitting: Nurse Practitioner

## 2018-01-10 ENCOUNTER — Encounter: Payer: Self-pay | Admitting: Nurse Practitioner

## 2018-01-10 ENCOUNTER — Ambulatory Visit (INDEPENDENT_AMBULATORY_CARE_PROVIDER_SITE_OTHER): Payer: Medicare Other | Admitting: Nurse Practitioner

## 2018-01-10 VITALS — BP 150/91 | HR 77 | Temp 97.4°F | Ht 64.0 in | Wt 130.0 lb

## 2018-01-10 DIAGNOSIS — Z23 Encounter for immunization: Secondary | ICD-10-CM | POA: Diagnosis not present

## 2018-01-10 DIAGNOSIS — E7800 Pure hypercholesterolemia, unspecified: Secondary | ICD-10-CM

## 2018-01-10 DIAGNOSIS — E034 Atrophy of thyroid (acquired): Secondary | ICD-10-CM

## 2018-01-10 DIAGNOSIS — M329 Systemic lupus erythematosus, unspecified: Secondary | ICD-10-CM

## 2018-01-10 DIAGNOSIS — F411 Generalized anxiety disorder: Secondary | ICD-10-CM

## 2018-01-10 DIAGNOSIS — F3342 Major depressive disorder, recurrent, in full remission: Secondary | ICD-10-CM

## 2018-01-10 MED ORDER — LEVOTHYROXINE SODIUM 175 MCG PO TABS: 175.0000 ug | ORAL_TABLET | Freq: Every morning | ORAL | 0 refills | Status: DC

## 2018-01-10 NOTE — Patient Instructions (Signed)
Fat and Cholesterol Restricted Diet High levels of fat and cholesterol in your blood may lead to various health problems, such as diseases of the heart, blood vessels, gallbladder, liver, and pancreas. Fats are concentrated sources of energy that come in various forms. Certain types of fat, including saturated fat, may be harmful in excess. Cholesterol is a substance needed by your body in small amounts. Your body makes all the cholesterol it needs. Excess cholesterol comes from the food you eat. When you have high levels of cholesterol and saturated fat in your blood, health problems can develop because the excess fat and cholesterol will gather along the walls of your blood vessels, causing them to narrow. Choosing the right foods will help you control your intake of fat and cholesterol. This will help keep the levels of these substances in your blood within normal limits and reduce your risk of disease. What is my plan? Your health care provider recommends that you:  Limit your fat intake to ______% or less of your total calories per day.  Limit the amount of cholesterol in your diet to less than _________mg per day.  Eat 20-30 grams of fiber each day.  What types of fat should I choose?  Choose healthy fats more often. Choose monounsaturated and polyunsaturated fats, such as olive and canola oil, flaxseeds, walnuts, almonds, and seeds.  Eat more omega-3 fats. Good choices include salmon, mackerel, sardines, tuna, flaxseed oil, and ground flaxseeds. Aim to eat fish at least two times a week.  Limit saturated fats. Saturated fats are primarily found in animal products, such as meats, butter, and cream. Plant sources of saturated fats include palm oil, palm kernel oil, and coconut oil.  Avoid foods with partially hydrogenated oils in them. These contain trans fats. Examples of foods that contain trans fats are stick margarine, some tub margarines, cookies, crackers, and other baked goods. What  general guidelines do I need to follow? These guidelines for healthy eating will help you control your intake of fat and cholesterol:  Check food labels carefully to identify foods with trans fats or high amounts of saturated fat.  Fill one half of your plate with vegetables and green salads.  Fill one fourth of your plate with whole grains. Look for the word "whole" as the first word in the ingredient list.  Fill one fourth of your plate with lean protein foods.  Limit fruit to two servings a day. Choose fruit instead of juice.  Eat more foods that contain fiber, such as apples, broccoli, carrots, beans, peas, and barley.  Eat more home-cooked food and less restaurant, buffet, and fast food.  Limit or avoid alcohol.  Limit foods high in starch and sugar.  Limit fried foods.  Cook foods using methods other than frying. Baking, boiling, grilling, and broiling are all great options.  Lose weight if you are overweight. Losing just 5-10% of your initial body weight can help your overall health and prevent diseases such as diabetes and heart disease.  What foods can I eat? Grains  Whole grains, such as whole wheat or whole grain breads, crackers, cereals, and pasta. Unsweetened oatmeal, bulgur, barley, quinoa, or brown rice. Corn or whole wheat flour tortillas. Vegetables  Fresh or frozen vegetables (raw, steamed, roasted, or grilled). Green salads. Fruits  All fresh, canned (in natural juice), or frozen fruits. Meats and other protein foods  Ground beef (85% or leaner), grass-fed beef, or beef trimmed of fat. Skinless chicken or turkey. Ground chicken or turkey.   Pork trimmed of fat. All fish and seafood. Eggs. Dried beans, peas, or lentils. Unsalted nuts or seeds. Unsalted canned or dry beans. Dairy  Low-fat dairy products, such as skim or 1% milk, 2% or reduced-fat cheeses, low-fat ricotta or cottage cheese, or plain low-fat yo Fats and oils  Tub margarines without trans  fats. Light or reduced-fat mayonnaise and salad dressings. Avocado. Olive, canola, sesame, or safflower oils. Natural peanut or almond butter (choose ones without added sugar and oil). The items listed above may not be a complete list of recommended foods or beverages. Contact your dietitian for more options. Foods to avoid Grains  White bread. White pasta. White rice. Cornbread. Bagels, pastries, and croissants. Crackers that contain trans fat. Vegetables  White potatoes. Corn. Creamed or fried vegetables. Vegetables in a cheese sauce. Fruits  Dried fruits. Canned fruit in light or heavy syrup. Fruit juice. Meats and other protein foods  Fatty cuts of meat. Ribs, chicken wings, bacon, sausage, bologna, salami, chitterlings, fatback, hot dogs, bratwurst, and packaged luncheon meats. Liver and organ meats. Dairy  Whole or 2% milk, cream, half-and-half, and cream cheese. Whole milk cheeses. Whole-fat or sweetened yogurt. Full-fat cheeses. Nondairy creamers and whipped toppings. Processed cheese, cheese spreads, or cheese curds. Beverages  Alcohol. Sweetened drinks (such as sodas, lemonade, and fruit drinks or punches). Fats and oils  Butter, stick margarine, lard, shortening, ghee, or bacon fat. Coconut, palm kernel, or palm oils. Sweets and desserts  Corn syrup, sugars, honey, and molasses. Candy. Jam and jelly. Syrup. Sweetened cereals. Cookies, pies, cakes, donuts, muffins, and ice cream. The items listed above may not be a complete list of foods and beverages to avoid. Contact your dietitian for more information. This information is not intended to replace advice given to you by your health care provider. Make sure you discuss any questions you have with your health care provider. Document Released: 07/17/2005 Document Revised: 08/07/2014 Document Reviewed: 10/15/2013 Elsevier Interactive Patient Education  2018 Elsevier Inc.  

## 2018-01-10 NOTE — Progress Notes (Signed)
Subjective:    Patient ID: Kimberly Roth, female    DOB: [birth year], 72 y.o.   MRN: [id number]   Chief Complaint: Medical Mangement of Chronic Issues  HPI:  1. Hypothyroidism due to acquired atrophy of thyroid  Patient is on synthroid 131mg daily. She has no complaints today of fatigue.  2. Pure hypercholesterolemia  Patient labs at last visit showed increase in LDL- was placed on lipitor. Does not seem to be causing any issues.  3. GAD (generalized anxiety disorder)  Patient is under a lot of stress taking care of her ailing husband. She use to see psych but is no longer doing that. GAD 7 : Generalized Anxiety Score 01/10/2018  Nervous, Anxious, on Edge 2  Control/stop worrying 3  Worry too much - different things 3  Trouble relaxing 3  Restless 2  Easily annoyed or irritable 0  Afraid - awful might happen 2  Total GAD 7 Score 15  Anxiety Difficulty Somewhat difficult      4. Recurrent major depressive disorder, in full remission ([facility]  Patient is currently on libruim and wellbutrin. She seems seems to be working well for her. [facility] 01/10/2018 10/02/2017 02/09/2017 12/29/2016 02/17/2016  Decreased Interest 1 2 0 0 3  Down, Depressed, Hopeless 1 2 0 0 3  PHQ - 2 Score 2 4 0 0 6  Altered sleeping 0 2 - - 2  Tired, decreased energy 0 2 - - 2  Change in appetite 0 1 - - 1  Feeling bad or failure about yourself  0 0 - - 0  Trouble concentrating 0 0 - - 1  Moving slowly or fidgety/restless 0 0 - - 0  Suicidal thoughts 0 0 - - 0  PHQ-9 Score 2 9 - - 12  Difficult doing work/chores - - - - -     5. Systemic lupus erythematosus, unspecified SLE type, unspecified organ involvement status ([place]  Still currently not taking anything for lupus. She has seen several specialist through the years and they all want to put her on steroids and she does not want to do that. She "white willow burch" herb supplement for her pain and works well for her.. She has occasional  flare ups but says she is able to deal with it.    Outpatient Encounter Medications as of 01/10/2018  Medication Sig  . acetaminophen (TYLENOL) 325 MG tablet Take 650 mg by mouth every 4 (four) hours as needed.  . Ascorbic Acid (VITAMIN C) 1000 MG tablet Take 1,000 mg by mouth 2 (two) times daily.  .Marland Kitchenaspirin 325 MG EC tablet Take 325 mg by mouth 2 (two) times daily.  .Marland Kitchenatorvastatin (LIPITOR) 40 MG tablet Take 1 tablet (40 mg total) by mouth daily.  .Marland KitchenBIOTIN PO Take 1 tablet by mouth 2 (two) times daily.  . Blood Glucose Monitoring Suppl (ONETOUCH VERIO) w/Device KIT 1 kit by Does not apply route daily.  .Marland KitchenbuPROPion (WELLBUTRIN SR) 100 MG 12 hr tablet TAKE ONE (1) TABLET THREE (3) TIMES EACH DAY  . calcium-vitamin D (OSCAL WITH D) 250-125 MG-UNIT per tablet Take 1 tablet by mouth 2 (two) times daily.  . chlordiazePOXIDE (LIBRIUM) 25 MG capsule TAKE 1 CAPSULE 3 TIMES DAILY AS NEEDED FOR ANXIETY  . cholecalciferol (VITAMIN D) 1000 UNITS tablet Take 1,000 Units by mouth 2 (two) times daily.  . ciprofloxacin (CILOXAN) 0.3 % ophthalmic solution Administer 1 drop, every 2 hours, while awake  . CRANBERRY EXTRACT  PO Take 1 tablet by mouth 2 (two) times daily.  . diclofenac (VOLTAREN) 75 MG EC tablet Take 1 tablet (75 mg total) by mouth 2 (two) times daily. With food  . diphenhydrAMINE (BENADRYL) 25 MG tablet Take 50 mg by mouth at bedtime as needed for sleep.  Marland Kitchen levothyroxine (SYNTHROID) 125 MCG tablet Take 1 tablet (125 mcg total) by mouth daily.  Marland Kitchen levothyroxine (SYNTHROID, LEVOTHROID) 175 MCG tablet TAKE ONE TABLET EVERY MORNING  . Multiple Vitamins-Minerals (MULTIVITAMINS THER. W/MINERALS) TABS tablet Take 1 tablet by mouth 2 (two) times daily.  . multivitamin-lutein (OCUVITE-LUTEIN) CAPS capsule Take 1 capsule by mouth 2 (two) times daily.  . Omega-3 Fatty Acids (OMEGA 3 PO) Take 1 capsule by mouth 2 (two) times daily.  . Potassium Gluconate 595 MG CAPS Take 1 capsule by mouth daily.  . Thiamine  HCl (VITAMIN B-1) 250 MG tablet Take 250 mg by mouth 2 (two) times daily.       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   Review of Systems  Constitutional: Negative for activity change and appetite change.  HENT: Negative.   Eyes: Negative for pain.  Respiratory: Negative for shortness of breath.   Cardiovascular: Negative for chest pain, palpitations and leg swelling.  Gastrointestinal: Negative for abdominal pain.  Endocrine: Negative for polydipsia.  Genitourinary: Negative.   Musculoskeletal: Positive for arthralgias (continued shoulder pain) and myalgias.  Skin: Negative for rash.  Neurological: Negative for dizziness, weakness and headaches.  Hematological: Does not bruise/bleed easily.  Psychiatric/Behavioral: Negative.   All other systems reviewed and are negative.      Objective:   Physical Exam  Constitutional: She is oriented to person, place, and time. She appears well-developed and well-nourished. She appears distressed (mild).  HENT:  Head: Normocephalic.  Nose: Nose normal.  Mouth/Throat: Oropharynx is clear and moist.  Eyes: Pupils are equal, round, and reactive to light. EOM are normal.  Neck: Normal range of motion. Neck supple. No JVD present. Carotid bruit is not present.  Cardiovascular: Normal rate, regular rhythm, normal heart sounds and intact distal pulses.  Pulmonary/Chest: Effort normal and breath sounds normal. No respiratory distress. She has no wheezes. She has no rales. She exhibits no tenderness.  Abdominal: Soft. Normal appearance, normal aorta and bowel sounds are normal. She exhibits no distension, no abdominal bruit, no pulsatile midline mass and no mass. There is no splenomegaly or hepatomegaly. There is no tenderness.  Musculoskeletal: She exhibits no edema.    Lymphadenopathy:    She has no cervical adenopathy.  Neurological: She is alert and oriented to person, place, and time. She has normal reflexes.  Skin: Skin is warm and dry.  Psychiatric: She has a normal mood and affect. Her behavior is normal. Judgment and thought content normal.   BP (!) 150/91   Pulse 77   Temp (!) 97.4 F (36.3 C) (Oral)   Ht '5\' 4"'$  (1.626 m)   Wt 130 lb (59 kg)   BMI 22.31 kg/m        Assessment & Plan:  AYNSLEE MULHALL comes in today with chief complaint of Medical Management of Chronic Issues   Diagnosis and orders addressed:  1. Hypothyroidism due to acquired atrophy of thyroid Labs pending - levothyroxine (SYNTHROID, LEVOTHROID) 175 MCG tablet; Take 1 tablet (  175 mcg total) by mouth every morning.  Dispense: 90 tablet; Refill: 0 - Thyroid Panel With TSH  2. Pure hypercholesterolemia Low fat diet eercise when can - CMP14+EGFR - Lipid panel  3. GAD (generalized anxiety disorder) Stress management  4. Recurrent major depressive disorder, in full remission ([place]) Continue libruim and wellbutrin as rx  5. Systemic lupus erythematosus, unspecified SLE type, unspecified organ involvement status [facility]) patient will let me know when and if she wants to see someone about hr lupus   Labs pending Health Maintenance reviewed Diet and exercise encouraged  Follow up plan: 3 months   [place], FNP

## 2018-01-10 NOTE — Addendum Note (Signed)
Addended by: Rolena Infante on: 01/10/2018 02:40 PM   Modules accepted: Orders

## 2018-01-11 LAB — CMP14+EGFR
A/G RATIO: 2.2 (ref 1.2–2.2)
ALBUMIN: 4.8 g/dL (ref 3.5–4.8)
ALT: 13 IU/L (ref 0–32)
AST: 21 IU/L (ref 0–40)
Alkaline Phosphatase: 70 IU/L (ref 39–117)
BILIRUBIN TOTAL: 0.4 mg/dL (ref 0.0–1.2)
BUN / CREAT RATIO: 12 (ref 12–28)
BUN: 9 mg/dL (ref 8–27)
CALCIUM: 9.2 mg/dL (ref 8.7–10.3)
CHLORIDE: 99 mmol/L (ref 96–106)
CO2: 25 mmol/L (ref 20–29)
Creatinine, Ser: 0.76 mg/dL (ref 0.57–1.00)
GFR, EST AFRICAN AMERICAN: 92 mL/min/{1.73_m2} (ref >59)
GFR, EST NON AFRICAN AMERICAN: 80 mL/min/{1.73_m2} (ref >59)
GLUCOSE: 84 mg/dL (ref 65–99)
Globulin, Total: 2.2 g/dL (ref 1.5–4.5)
Potassium: 4.3 mmol/L (ref 3.5–5.2)
Sodium: 137 mmol/L (ref 134–144)
Total Protein: 7 g/dL (ref 6.0–8.5)

## 2018-01-11 LAB — LIPID PANEL
CHOL/HDL RATIO: 2.8 ratio (ref 0.0–4.4)
Cholesterol, Total: 282 mg/dL — ABNORMAL HIGH (ref 100–199)
HDL: 100 mg/dL (ref >39)
LDL Calculated: 170 mg/dL — ABNORMAL HIGH (ref 0–99)
Triglycerides: 62 mg/dL (ref 0–149)
VLDL CHOLESTEROL CAL: 12 mg/dL (ref 5–40)

## 2018-01-11 LAB — THYROID PANEL WITH TSH
Free Thyroxine Index: 1.7 (ref 1.2–4.9)
T3 Uptake Ratio: 22 % — ABNORMAL LOW (ref 24–39)
T4 TOTAL: 7.7 ug/dL (ref 4.5–12.0)
TSH: 11.58 u[IU]/mL — AB (ref 0.450–4.500)

## 2018-01-21 ENCOUNTER — Other Ambulatory Visit: Payer: Self-pay | Admitting: Nurse Practitioner

## 2018-01-21 DIAGNOSIS — H2513 Age-related nuclear cataract, bilateral: Secondary | ICD-10-CM | POA: Diagnosis not present

## 2018-01-21 DIAGNOSIS — H40033 Anatomical narrow angle, bilateral: Secondary | ICD-10-CM | POA: Diagnosis not present

## 2018-01-21 MED ORDER — LEVOTHYROXINE SODIUM 200 MCG PO TABS: 200.0000 ug | ORAL_TABLET | Freq: Every day | ORAL | 1 refills | Status: DC

## 2018-01-21 NOTE — Progress Notes (Signed)
Pt aware.

## 2018-01-21 NOTE — Progress Notes (Signed)
Levothyroxine dose increased to 2105mcg daily- will need to recheck labs in 6 weeks

## 2018-02-28 ENCOUNTER — Other Ambulatory Visit: Payer: Self-pay | Admitting: Nurse Practitioner

## 2018-02-28 DIAGNOSIS — F3342 Major depressive disorder, recurrent, in full remission: Secondary | ICD-10-CM

## 2018-03-04 ENCOUNTER — Telehealth: Payer: Self-pay | Admitting: Nurse Practitioner

## 2018-03-05 NOTE — Telephone Encounter (Signed)
Patient aware no one has called per her chart.

## 2018-03-11 ENCOUNTER — Ambulatory Visit: Payer: Self-pay | Admitting: *Deleted

## 2018-03-11 DIAGNOSIS — R609 Edema, unspecified: Secondary | ICD-10-CM | POA: Diagnosis not present

## 2018-03-11 DIAGNOSIS — I788 Other diseases of capillaries: Secondary | ICD-10-CM | POA: Diagnosis not present

## 2018-03-11 DIAGNOSIS — L720 Epidermal cyst: Secondary | ICD-10-CM | POA: Diagnosis not present

## 2018-03-11 DIAGNOSIS — L821 Other seborrheic keratosis: Secondary | ICD-10-CM | POA: Diagnosis not present

## 2018-03-20 DIAGNOSIS — G43711 Chronic migraine without aura, intractable, with status migrainosus: Secondary | ICD-10-CM | POA: Diagnosis not present

## 2018-04-16 ENCOUNTER — Ambulatory Visit: Payer: Self-pay | Admitting: Nurse Practitioner

## 2018-04-22 ENCOUNTER — Other Ambulatory Visit: Payer: Self-pay | Admitting: Nurse Practitioner

## 2018-04-22 DIAGNOSIS — F329 Major depressive disorder, single episode, unspecified: Secondary | ICD-10-CM

## 2018-04-22 DIAGNOSIS — F32A Depression, unspecified: Secondary | ICD-10-CM

## 2018-05-03 ENCOUNTER — Encounter: Payer: Self-pay | Admitting: Nurse Practitioner

## 2018-05-03 ENCOUNTER — Ambulatory Visit (INDEPENDENT_AMBULATORY_CARE_PROVIDER_SITE_OTHER): Payer: Medicare Other | Admitting: Nurse Practitioner

## 2018-05-03 VITALS — BP 134/85 | HR 77 | Temp 98.2°F | Ht 64.0 in | Wt 130.0 lb

## 2018-05-03 DIAGNOSIS — M329 Systemic lupus erythematosus, unspecified: Secondary | ICD-10-CM | POA: Diagnosis not present

## 2018-05-03 DIAGNOSIS — E034 Atrophy of thyroid (acquired): Secondary | ICD-10-CM | POA: Diagnosis not present

## 2018-05-03 DIAGNOSIS — Z23 Encounter for immunization: Secondary | ICD-10-CM

## 2018-05-03 DIAGNOSIS — F3342 Major depressive disorder, recurrent, in full remission: Secondary | ICD-10-CM

## 2018-05-03 DIAGNOSIS — E7800 Pure hypercholesterolemia, unspecified: Secondary | ICD-10-CM

## 2018-05-03 DIAGNOSIS — F411 Generalized anxiety disorder: Secondary | ICD-10-CM | POA: Diagnosis not present

## 2018-05-03 MED ORDER — CHLORDIAZEPOXIDE HCL 25 MG PO CAPS
25.0000 mg | ORAL_CAPSULE | Freq: Every day | ORAL | 1 refills | Status: DC
Start: 2018-05-03 — End: 2018-07-08

## 2018-05-03 MED ORDER — ATORVASTATIN CALCIUM 40 MG PO TABS: 40.0000 mg | ORAL_TABLET | Freq: Every day | ORAL | 5 refills | Status: DC

## 2018-05-03 MED ORDER — BUPROPION HCL ER (XL) 150 MG PO TB24: 150.0000 mg | ORAL_TABLET | Freq: Every day | ORAL | 1 refills | Status: DC

## 2018-05-03 NOTE — Progress Notes (Signed)
Subjective:    Patient ID: Kimberly Roth, female    DOB: 1946/01/10, 72 y.o.   MRN: [id number]   Chief Complaint: Medical Management of Chronic Issues   HPI:  1. Hypothyroidism due to acquired atrophy of thyroid  Not having any problems that she is aware of. Last TSH was 11.5 Takes levothyroxin 25mg daily currently  2. Recurrent major depressive disorder, in full remission ([place]  Is curretntly on librium and wellbutrin daily. Says she is doing ok. No side effects from medication. Husband want she wellbutrin increased to '150mg'$  because that dose is cheaper for her.  [facility] 05/03/2018 01/10/2018 10/02/2017  Decreased Interest 0 1 2  Down, Depressed, Hopeless 0 1 2  PHQ - 2 Score 0 2 4  Altered sleeping - 0 2  Tired, decreased energy - 0 2  Change in appetite - 0 1  Feeling bad or failure about yourself  - 0 0  Trouble concentrating - 0 0  Moving slowly or fidgety/restless - 0 0  Suicidal thoughts - 0 0  PHQ-9 Score - 2 9  Difficult doing work/chores - - -     3. GAD (generalized anxiety disorder)  She is under a lot of stress with her husbands cancer and having to help take care of him.  4. Pure hypercholesterolemia  Takes daily lipitor. Tries to watch diet. Stays as active as she can  5. Systemic lupus erythematosus, unspecified SLE type, unspecified organ involvement status ([place] is not taking any medication for this right now. Her son in law treats this and she says him a couple of times a year.    Outpatient Encounter Medications as of 05/03/2018  Medication Sig  . acetaminophen (TYLENOL) 325 MG tablet Take 650 mg by mouth every 4 (four) hours as needed.  . Ascorbic Acid (VITAMIN C) 1000 MG tablet Take 1,000 mg by mouth 2 (two) times daily.  .Marland Kitchenaspirin 325 MG EC tablet Take 325 mg by mouth 2 (two) times daily.  .Marland Kitchenatorvastatin (LIPITOR) 40 MG tablet Take 1 tablet (40 mg total) by mouth daily.  .Marland KitchenBIOTIN PO Take 1 tablet by mouth 2 (two) times daily.  .  Blood Glucose Monitoring Suppl (ONETOUCH VERIO) w/Device KIT 1 kit by Does not apply route daily.  .Marland KitchenbuPROPion (WELLBUTRIN SR) 100 MG 12 hr tablet TAKE ONE (1) TABLET THREE (3) TIMES EACH DAY  . calcium-vitamin D (OSCAL WITH D) 250-125 MG-UNIT per tablet Take 1 tablet by mouth 2 (two) times daily.  . chlordiazePOXIDE (LIBRIUM) 25 MG capsule TAKE 1 CAPSULE 3 TIMES DAILY AS NEEDED FOR ANXIETY  . cholecalciferol (VITAMIN D) 1000 UNITS tablet Take 1,000 Units by mouth 2 (two) times daily.  .Marland KitchenCRANBERRY EXTRACT PO Take 1 tablet by mouth 2 (two) times daily.  . diclofenac (VOLTAREN) 75 MG EC tablet Take 1 tablet (75 mg total) by mouth 2 (two) times daily. With food  . diphenhydrAMINE (BENADRYL) 25 MG tablet Take 50 mg by mouth at bedtime as needed for sleep.  .Marland Kitchenlevothyroxine (SYNTHROID) 200 MCG tablet Take 1 tablet (200 mcg total) by mouth daily.  . Multiple Vitamins-Minerals (MULTIVITAMINS THER. W/MINERALS) TABS tablet Take 1 tablet by mouth 2 (two) times daily.  . multivitamin-lutein (OCUVITE-LUTEIN) CAPS capsule Take 1 capsule by mouth 2 (two) times daily.  . Omega-3 Fatty Acids (OMEGA 3 PO) Take 1 capsule by mouth 2 (two) times daily.  . Potassium Gluconate 595 MG CAPS Take 1 capsule by mouth daily.  .Marland Kitchen  Thiamine HCl (VITAMIN B-1) 250 MG tablet Take 250 mg by mouth 2 (two) times daily.       New complaints: none today  Social history: Husband has cancer and he is gradually going down hill. She says that he will soon be in a wheel chair.   Review of Systems  Constitutional: Negative for activity change and appetite change.  HENT: Negative.   Eyes: Negative for pain.  Respiratory: Negative for shortness of breath.   Cardiovascular: Negative for chest pain, palpitations and leg swelling.  Gastrointestinal: Negative for abdominal pain.  Endocrine: Negative for polydipsia.  Genitourinary: Negative.   Skin: Negative for rash.  Neurological: Negative for dizziness, weakness and headaches.    Hematological: Does not bruise/bleed easily.  Psychiatric/Behavioral: Negative.   All other systems reviewed and are negative.      Objective:   Physical Exam  Constitutional: She is oriented to person, place, and time. She appears well-developed and well-nourished. No distress.  HENT:  Head: Normocephalic.  Nose: Nose normal.  Mouth/Throat: Oropharynx is clear and moist.  Eyes: Pupils are equal, round, and reactive to light. EOM are normal.  Neck: Normal range of motion. Neck supple. No JVD present. Carotid bruit is not present.  Cardiovascular: Normal rate, regular rhythm, normal heart sounds and intact distal pulses.  Pulmonary/Chest: Effort normal and breath sounds normal. No respiratory distress. She has no wheezes. She has no rales. She exhibits no tenderness.  Abdominal: Soft. Normal appearance, normal aorta and bowel sounds are normal. She exhibits no distension, no abdominal bruit, no pulsatile midline mass and no mass. There is no splenomegaly or hepatomegaly. There is no tenderness.  Musculoskeletal: Normal range of motion. She exhibits no edema.  Lymphadenopathy:    She has no cervical adenopathy.  Neurological: She is alert and oriented to person, place, and time. She has normal reflexes.  Skin: Skin is warm and dry.  Psychiatric: She has a normal mood and affect. Her behavior is normal. Judgment and thought content normal.  Nursing note and vitals reviewed.  BP 134/85   Pulse 77   Temp 98.2 F (36.8 C) (Oral)   Ht '5\' 4"'$  (1.626 m)   Wt 130 lb (59 kg)   BMI 22.31 kg/m       Assessment & Plan:  Kimberly Roth comes in today with chief complaint of Medical Management of Chronic Issues   Diagnosis and orders addressed:  1. Hypothyroidism due to acquired atrophy of thyroid Will wait on lab results before filling meds - Thyroid Panel With TSH  2. Recurrent major depressive disorder, in full remission ([place]) Stress management - chlordiazePOXIDE (LIBRIUM) 25 MG  capsule; Take 1 capsule (25 mg total) by mouth daily.  Dispense: 90 capsule; Refill: 1 - increased wellbutrin to 150XR 1 po daily #90n1 refill  3. GAD (generalized anxiety disorder) Stress management  4. Pure hypercholesterolemia Low fat det - CMP14+EGFR - Lipid panel - atorvastatin (LIPITOR) 40 MG tablet; Take 1 tablet (40 mg total) by mouth daily.  Dispense: 30 tablet; Refill: 5  5. Systemic lupus erythematosus, unspecified SLE type, unspecified organ involvement status ([place]) Follow up with specialist encouraged to wear hat and sunglasses when out in sun for skin protection - CBC with Differential/Platelet   Labs pending Health Maintenance reviewed Diet and exercise encouraged  Follow up plan: 6 months   [place], FNP

## 2018-05-03 NOTE — Patient Instructions (Signed)
Stress and Stress Management Stress is a normal reaction to life events. It is what you feel when life demands more than you are used to or more than you can handle. Some stress can be useful. For example, the stress reaction can help you catch the last bus of the day, study for a test, or meet a deadline at work. But stress that occurs too often or for too long can cause problems. It can affect your emotional health and interfere with relationships and normal daily activities. Too much stress can weaken your immune system and increase your risk for physical illness. If you already have a medical problem, stress can make it worse. What are the causes? All sorts of life events may cause stress. An event that causes stress for one person may not be stressful for another person. Major life events commonly cause stress. These may be positive or negative. Examples include losing your job, moving into a new home, getting married, having a baby, or losing a loved one. Less obvious life events may also cause stress, especially if they occur day after day or in combination. Examples include working long hours, driving in traffic, caring for children, being in debt, or being in a difficult relationship. What are the signs or symptoms? Stress may cause emotional symptoms including, the following:  Anxiety. This is feeling worried, afraid, on edge, overwhelmed, or out of control.  Anger. This is feeling irritated or impatient.  Depression. This is feeling sad, down, helpless, or guilty.  Difficulty focusing, remembering, or making decisions.  Stress may cause physical symptoms, including the following:  Aches and pains. These may affect your head, neck, back, stomach, or other areas of your body.  Tight muscles or clenched jaw.  Low energy or trouble sleeping.  Stress may cause unhealthy behaviors, including the following:  Eating to feel better (overeating) or skipping meals.  Sleeping too little,  too much, or both.  Working too much or putting off tasks (procrastination).  Smoking, drinking alcohol, or using drugs to feel better.  How is this diagnosed? Stress is diagnosed through an assessment by your health care provider. Your health care provider will ask questions about your symptoms and any stressful life events.Your health care provider will also ask about your medical history and may order blood tests or other tests. Certain medical conditions and medicine can cause physical symptoms similar to stress. Mental illness can cause emotional symptoms and unhealthy behaviors similar to stress. Your health care provider may refer you to a mental health professional for further evaluation. How is this treated? Stress management is the recommended treatment for stress.The goals of stress management are reducing stressful life events and coping with stress in healthy ways. Techniques for reducing stressful life events include the following:  Stress identification. Self-monitor for stress and identify what causes stress for you. These skills may help you to avoid some stressful events.  Time management. Set your priorities, keep a calendar of events, and learn to say "no." These tools can help you avoid making too many commitments.  Techniques for coping with stress include the following:  Rethinking the problem. Try to think realistically about stressful events rather than ignoring them or overreacting. Try to find the positives in a stressful situation rather than focusing on the negatives.  Exercise. Physical exercise can release both physical and emotional tension. The key is to find a form of exercise you enjoy and do it regularly.  Relaxation techniques. These relax the body and  mind. Examples include yoga, meditation, tai chi, biofeedback, deep breathing, progressive muscle relaxation, listening to music, being out in nature, journaling, and other hobbies. Again, the key is to find  one or more that you enjoy and can do regularly.  Healthy lifestyle. Eat a balanced diet, get plenty of sleep, and do not smoke. Avoid using alcohol or drugs to relax.  Strong support network. Spend time with family, friends, or other people you enjoy being around.Express your feelings and talk things over with someone you trust.  Counseling or talktherapy with a mental health professional may be helpful if you are having difficulty managing stress on your own. Medicine is typically not recommended for the treatment of stress.Talk to your health care provider if you think you need medicine for symptoms of stress. Follow these instructions at home:  Keep all follow-up visits as directed by your health care provider.  Take all medicines as directed by your health care provider. Contact a health care provider if:  Your symptoms get worse or you start having new symptoms.  You feel overwhelmed by your problems and can no longer manage them on your own. Get help right away if:  You feel like hurting yourself or someone else. This information is not intended to replace advice given to you by your health care provider. Make sure you discuss any questions you have with your health care provider. Document Released: 01/10/2001 Document Revised: 12/23/2015 Document Reviewed: 03/11/2013 Elsevier Interactive Patient Education  2017 Elsevier Inc.  

## 2018-05-04 LAB — CBC WITH DIFFERENTIAL/PLATELET
BASOS: 1 %
Basophils Absolute: 0 10*3/uL (ref 0.0–0.2)
EOS (ABSOLUTE): 0.1 10*3/uL (ref 0.0–0.4)
EOS: 3 %
HEMATOCRIT: 31.6 % — AB (ref 34.0–46.6)
HEMOGLOBIN: 11.3 g/dL (ref 11.1–15.9)
IMMATURE GRANS (ABS): 0 10*3/uL (ref 0.0–0.1)
IMMATURE GRANULOCYTES: 0 %
Lymphocytes Absolute: 1.7 10*3/uL (ref 0.7–3.1)
Lymphs: 43 %
MCH: 34.2 pg — ABNORMAL HIGH (ref 26.6–33.0)
MCHC: 35.8 g/dL — ABNORMAL HIGH (ref 31.5–35.7)
MCV: 96 fL (ref 79–97)
MONOCYTES: 9 %
Monocytes Absolute: 0.4 10*3/uL (ref 0.1–0.9)
Neutrophils Absolute: 1.8 10*3/uL (ref 1.4–7.0)
Neutrophils: 44 %
Platelets: 220 10*3/uL (ref 150–450)
RBC: 3.3 x10E6/uL — ABNORMAL LOW (ref 3.77–5.28)
RDW: 12.3 % (ref 12.3–15.4)
WBC: 4.1 10*3/uL (ref 3.4–10.8)

## 2018-05-04 LAB — THYROID PANEL WITH TSH
Free Thyroxine Index: 1.3 (ref 1.2–4.9)
T3 Uptake Ratio: 25 % (ref 24–39)
T4, Total: 5 ug/dL (ref 4.5–12.0)
TSH: 20.82 u[IU]/mL — AB (ref 0.450–4.500)

## 2018-05-04 LAB — CMP14+EGFR
ALBUMIN: 4.6 g/dL (ref 3.5–4.8)
ALT: 12 IU/L (ref 0–32)
AST: 22 IU/L (ref 0–40)
Albumin/Globulin Ratio: 2.7 — ABNORMAL HIGH (ref 1.2–2.2)
Alkaline Phosphatase: 55 IU/L (ref 39–117)
BUN / CREAT RATIO: 21 (ref 12–28)
BUN: 15 mg/dL (ref 8–27)
Bilirubin Total: 0.2 mg/dL (ref 0.0–1.2)
CALCIUM: 9.1 mg/dL (ref 8.7–10.3)
CO2: 25 mmol/L (ref 20–29)
CREATININE: 0.73 mg/dL (ref 0.57–1.00)
Chloride: 97 mmol/L (ref 96–106)
GFR, EST AFRICAN AMERICAN: 96 mL/min/{1.73_m2} (ref >59)
GFR, EST NON AFRICAN AMERICAN: 83 mL/min/{1.73_m2} (ref >59)
GLOBULIN, TOTAL: 1.7 g/dL (ref 1.5–4.5)
Glucose: 82 mg/dL (ref 65–99)
Potassium: 4 mmol/L (ref 3.5–5.2)
Sodium: 136 mmol/L (ref 134–144)
TOTAL PROTEIN: 6.3 g/dL (ref 6.0–8.5)

## 2018-05-04 LAB — LIPID PANEL
CHOL/HDL RATIO: 3.3 ratio (ref 0.0–4.4)
Cholesterol, Total: 301 mg/dL — ABNORMAL HIGH (ref 100–199)
HDL: 92 mg/dL (ref >39)
LDL CALC: 198 mg/dL — AB (ref 0–99)
TRIGLYCERIDES: 55 mg/dL (ref 0–149)
VLDL Cholesterol Cal: 11 mg/dL (ref 5–40)

## 2018-05-06 ENCOUNTER — Telehealth: Payer: Self-pay | Admitting: *Deleted

## 2018-05-06 MED ORDER — LEVOTHYROXINE SODIUM 50 MCG PO TABS: 50.0000 ug | ORAL_TABLET | Freq: Every day | ORAL | 3 refills | Status: DC

## 2018-05-06 NOTE — Addendum Note (Signed)
Addended by: [name] on: 05/06/2018 07:30 AM   Modules accepted: Orders

## 2018-05-06 NOTE — Telephone Encounter (Signed)
Pt notified of results Verbalizes understanding 

## 2018-05-06 NOTE — Telephone Encounter (Signed)
-----   Message from [facility], [place] sent at 05/06/2018  7:30 AM EDT ----- Overall cbc is good- hgb is lower end of normal- make sure take daily multivitamin with iron Kidney and liver function stable ldl are terrible- really needs to be on crestor- will she agree to take? TSH is way to high- currently on levothyroxine 266mcg daily- going to have to add levothyroxine 58mcg daili- so now will be on 2 levothyroxines a day at different doses. Need to recheck labs in 6 weeks.

## 2018-05-06 NOTE — Telephone Encounter (Signed)
-----   Message from [facility], [place] sent at 05/06/2018  7:30 AM EDT ----- Overall cbc is good- hgb is lower end of normal- make sure take daily multivitamin with iron Kidney and liver function stable ldl are terrible- really needs to be on crestor- will she agree to take? TSH is way to high- currently on levothyroxine 268mcg daily- going to have to add levothyroxine 19mcg daili- so now will be on 2 levothyroxines a day at different doses. Need to recheck labs in 6 weeks.

## 2018-05-08 DIAGNOSIS — H20011 Primary iridocyclitis, right eye: Secondary | ICD-10-CM | POA: Diagnosis not present

## 2018-05-10 DIAGNOSIS — H20011 Primary iridocyclitis, right eye: Secondary | ICD-10-CM | POA: Diagnosis not present

## 2018-05-27 DIAGNOSIS — H20011 Primary iridocyclitis, right eye: Secondary | ICD-10-CM | POA: Diagnosis not present

## 2018-06-18 DIAGNOSIS — H20011 Primary iridocyclitis, right eye: Secondary | ICD-10-CM | POA: Diagnosis not present

## 2018-07-03 DIAGNOSIS — H20011 Primary iridocyclitis, right eye: Secondary | ICD-10-CM | POA: Diagnosis not present

## 2018-07-04 ENCOUNTER — Telehealth: Payer: Self-pay | Admitting: Nurse Practitioner

## 2018-07-04 ENCOUNTER — Other Ambulatory Visit: Payer: Self-pay

## 2018-07-04 DIAGNOSIS — F3342 Major depressive disorder, recurrent, in full remission: Secondary | ICD-10-CM

## 2018-07-04 NOTE — Telephone Encounter (Signed)
Spoke with pt and she states she just got a prescription for her Librium at the pharmacy 25mg  once daily and states for years she has been on 25mg  TID and doesn't know why it was changed. It looks like it was changed at her office visit 05/03/18. Pt states she has enough medication to take TID as she has been in the past and knows you are out of office until Next Tuesday. Please advise on dose.

## 2018-07-08 MED ORDER — CHLORDIAZEPOXIDE HCL 25 MG PO CAPS: 25.0000 mg | ORAL_CAPSULE | Freq: Three times a day (TID) | ORAL | 1 refills | Status: DC

## 2018-07-08 NOTE — Addendum Note (Signed)
Addended by: [name] on: 07/08/2018 04:27 PM   Modules accepted: Orders

## 2018-07-08 NOTE — Telephone Encounter (Addendum)
Kimberly Roth reviewed her meds at last visit very carefully.if I remember correctly she said that the librium was expensive so she did not usually take TID and that 1 a day would last byt her husband wanted her wellbutirn increased because it was cheaper for them. Please ask her how she would like to take so we can make sure it is right in computer.

## 2018-07-08 NOTE — Telephone Encounter (Signed)
Pt states she wants to take librium TID.

## 2018-07-10 ENCOUNTER — Telehealth: Payer: Self-pay | Admitting: Nurse Practitioner

## 2018-07-10 NOTE — Telephone Encounter (Signed)
Pt aware rx for Librium for TID sent to the pharmacy.

## 2018-08-11 ENCOUNTER — Other Ambulatory Visit: Payer: Self-pay | Admitting: Nurse Practitioner

## 2018-08-11 DIAGNOSIS — F3342 Major depressive disorder, recurrent, in full remission: Secondary | ICD-10-CM

## 2018-08-12 NOTE — Telephone Encounter (Signed)
Last seen 05/03/18  MMM 

## 2018-09-02 DIAGNOSIS — D485 Neoplasm of uncertain behavior of skin: Secondary | ICD-10-CM | POA: Diagnosis not present

## 2018-09-02 DIAGNOSIS — L430 Hypertrophic lichen planus: Secondary | ICD-10-CM | POA: Diagnosis not present

## 2018-09-24 ENCOUNTER — Encounter: Payer: Self-pay | Admitting: *Deleted

## 2018-10-03 ENCOUNTER — Ambulatory Visit (INDEPENDENT_AMBULATORY_CARE_PROVIDER_SITE_OTHER): Payer: Medicare Other | Admitting: Nurse Practitioner

## 2018-10-03 ENCOUNTER — Encounter: Payer: Self-pay | Admitting: Nurse Practitioner

## 2018-10-03 VITALS — BP 139/83 | HR 72 | Temp 97.5°F | Ht 64.0 in | Wt 130.0 lb

## 2018-10-03 DIAGNOSIS — E034 Atrophy of thyroid (acquired): Secondary | ICD-10-CM | POA: Diagnosis not present

## 2018-10-03 DIAGNOSIS — R5383 Other fatigue: Secondary | ICD-10-CM | POA: Diagnosis not present

## 2018-10-03 NOTE — Progress Notes (Signed)
Subjective:    Patient ID: Kimberly Roth, female    DOB: Nov 26, 1945, 73 y.o.   MRN: [id number]   Chief Complaint: No energy (Worried that hgb may be low) and Stress   HPI Patient comes in today c/o feeling weak. She has a history of low hgb and thinks it may need to be checked. Last hgb was 11.3 and RBC were 3.3. she thinks this is coming from her lupus. She says she can sleep 10hours and wake up and feel like he never went to bed. She denies snoring. Her depression level is unchanged. Depression screen [facility] 2/9 10/03/2018 05/03/2018 01/10/2018  Decreased Interest 0 0 1  Down, Depressed, Hopeless 0 0 1  PHQ - 2 Score 0 0 2  Altered sleeping - - 0  Tired, decreased energy - - 0  Change in appetite - - 0  Feeling bad or failure about yourself  - - 0  Trouble concentrating - - 0  Moving slowly or fidgety/restless - - 0  Suicidal thoughts - - 0  PHQ-9 Score - - 2  Difficult doing work/chores - - -   *  She doe shave history of hypothyridism. Last tsh was 20.8 we increased by 39mcg her levothyroxin . She does not think that she is staking correct dose.  Review of Systems  Constitutional: Positive for fatigue.  HENT: Negative.   Respiratory: Negative.   Cardiovascular: Negative.   Gastrointestinal: Negative.   Skin: Negative.   Neurological: Negative.   Psychiatric/Behavioral: Negative.   All other systems reviewed and are negative.      Objective:   Physical Exam Vitals signs and nursing note reviewed.  Constitutional:      General: She is not in acute distress.    Appearance: Normal appearance. She is well-developed.  HENT:     Head: Normocephalic.     Nose: Nose normal.  Eyes:     Pupils: Pupils are equal, round, and reactive to light.  Neck:     Musculoskeletal: Normal range of motion and neck supple.     Vascular: No carotid bruit or JVD.  Cardiovascular:     Rate and Rhythm: Normal rate and regular rhythm.     Heart sounds: Normal heart sounds.  Pulmonary:   Effort: Pulmonary effort is normal. No respiratory distress.     Breath sounds: Normal breath sounds. No wheezing or rales.  Chest:     Chest wall: No tenderness.  Abdominal:     General: Bowel sounds are normal. There is no distension or abdominal bruit.     Palpations: Abdomen is soft. There is no hepatomegaly, splenomegaly, mass or pulsatile mass.     Tenderness: There is no abdominal tenderness.  Musculoskeletal: Normal range of motion.  Lymphadenopathy:     Cervical: No cervical adenopathy.  Skin:    General: Skin is warm and dry.  Neurological:     Mental Status: She is alert and oriented to person, place, and time.     Deep Tendon Reflexes: Reflexes are normal and symmetric.  Psychiatric:        Behavior: Behavior normal.        Thought Content: Thought content normal.        Judgment: Judgment normal.    BP 139/83   Pulse 72   Temp (!) 97.5 F (36.4 C) (Oral)   Ht 5\' 4"  (1.626 m)   Wt 130 lb (59 kg)   BMI 22.31 kg/m  Assessment & Plan:  Kimberly Roth in today with chief complaint of No energy (Worried that hgb may be low) and Stress   1. Fatigue, unspecified type Labs pending - CBC with Differential/Platelet  2. Hypothyroidism due to acquired atrophy of thyroid Will discuss lab results and levothyroxin doses once labs are done. - Thyroid Panel With TSH  [name] Done, FNP

## 2018-10-03 NOTE — Patient Instructions (Signed)
Hypothyroidism  Hypothyroidism is when the thyroid gland does not make enough of certain hormones (it is underactive). The thyroid gland is a small gland located in the lower front part of the neck, just in front of the windpipe (trachea). This gland makes hormones that help control how the body uses food for energy (metabolism) as well as how the heart and brain function. These hormones also play a role in keeping your bones strong. When the thyroid is underactive, it produces too little of the hormones thyroxine (T4) and triiodothyronine (T3). What are the causes? This condition may be caused by:  Hashimoto's disease. This is a disease in which the body's disease-fighting system (immune system) attacks the thyroid gland. This is the most common cause.  Viral infections.  Pregnancy.  Certain medicines.  Birth defects.  Past radiation treatments to the head or neck for cancer.  Past treatment with radioactive iodine.  Past exposure to radiation in the environment.  Past surgical removal of part or all of the thyroid.  Problems with a gland in the center of the brain (pituitary gland).  Lack of enough iodine in the diet. What increases the risk? You are more likely to develop this condition if:  You are female.  You have a family history of thyroid conditions.  You use a medicine called lithium.  You take medicines that affect the immune system (immunosuppressants). What are the signs or symptoms? Symptoms of this condition include:  Feeling as though you have no energy (lethargy).  Not being able to tolerate cold.  Weight gain that is not explained by a change in diet or exercise habits.  Lack of appetite.  Dry skin.  Coarse hair.  Menstrual irregularity.  Slowing of thought processes.  Constipation.  Sadness or depression. How is this diagnosed? This condition may be diagnosed based on:  Your symptoms, your medical history, and a physical exam.  Blood  tests. You may also have imaging tests, such as an ultrasound or MRI. How is this treated? This condition is treated with medicine that replaces the thyroid hormones that your body does not make. After you begin treatment, it may take several weeks for symptoms to go away. Follow these instructions at home:  Take over-the-counter and prescription medicines only as told by your health care provider.  If you start taking any new medicines, tell your health care provider.  Keep all follow-up visits as told by your health care provider. This is important. ? As your condition improves, your dosage of thyroid hormone medicine may change. ? You will need to have blood tests regularly so that your health care provider can monitor your condition. Contact a health care provider if:  Your symptoms do not get better with treatment.  You are taking thyroid replacement medicine and you: ? Sweat a lot. ? Have tremors. ? Feel anxious. ? Lose weight rapidly. ? Cannot tolerate heat. ? Have emotional swings. ? Have diarrhea. ? Feel weak. Get help right away if you have:  Chest pain.  An irregular heartbeat.  A rapid heartbeat.  Difficulty breathing. Summary  Hypothyroidism is when the thyroid gland does not make enough of certain hormones (it is underactive).  When the thyroid is underactive, it produces too little of the hormones thyroxine (T4) and triiodothyronine (T3).  The most common cause is Hashimoto's disease, a disease in which the body's disease-fighting system (immune system) attacks the thyroid gland. The condition can also be caused by viral infections, medicine, pregnancy, or past   radiation treatment to the head or neck.  Symptoms may include weight gain, dry skin, constipation, feeling as though you do not have energy, and not being able to tolerate cold.  This condition is treated with medicine to replace the thyroid hormones that your body does not make. This information  is not intended to replace advice given to you by your health care provider. Make sure you discuss any questions you have with your health care provider. Document Released: 07/17/2005 Document Revised: 06/27/2017 Document Reviewed: 06/27/2017 Elsevier Interactive Patient Education  2019 Elsevier Inc.  

## 2018-10-04 LAB — CBC WITH DIFFERENTIAL/PLATELET
BASOS ABS: 0 10*3/uL (ref 0.0–0.2)
Basos: 1 %
EOS (ABSOLUTE): 0.1 10*3/uL (ref 0.0–0.4)
EOS: 4 %
HEMATOCRIT: 32.2 % — AB (ref 34.0–46.6)
HEMOGLOBIN: 11 g/dL — AB (ref 11.1–15.9)
IMMATURE GRANS (ABS): 0 10*3/uL (ref 0.0–0.1)
Immature Granulocytes: 0 %
LYMPHS ABS: 1.9 10*3/uL (ref 0.7–3.1)
LYMPHS: 49 %
MCH: 33.4 pg — ABNORMAL HIGH (ref 26.6–33.0)
MCHC: 34.2 g/dL (ref 31.5–35.7)
MCV: 98 fL — ABNORMAL HIGH (ref 79–97)
MONOCYTES: 9 %
Monocytes Absolute: 0.4 10*3/uL (ref 0.1–0.9)
NEUTROS ABS: 1.4 10*3/uL (ref 1.4–7.0)
Neutrophils: 37 %
Platelets: 231 10*3/uL (ref 150–450)
RBC: 3.29 x10E6/uL — AB (ref 3.77–5.28)
RDW: 11.6 % — ABNORMAL LOW (ref 11.7–15.4)
WBC: 3.9 10*3/uL (ref 3.4–10.8)

## 2018-10-04 LAB — THYROID PANEL WITH TSH
FREE THYROXINE INDEX: 2.1 (ref 1.2–4.9)
T3 Uptake Ratio: 27 % (ref 24–39)
T4 TOTAL: 7.8 ug/dL (ref 4.5–12.0)
TSH: 0.231 u[IU]/mL — AB (ref 0.450–4.500)

## 2018-10-04 NOTE — Addendum Note (Signed)
Addended by: [name] on: 10/04/2018 11:03 AM   Modules accepted: Orders

## 2018-10-06 ENCOUNTER — Other Ambulatory Visit: Payer: Self-pay | Admitting: Nurse Practitioner

## 2018-10-06 DIAGNOSIS — F3342 Major depressive disorder, recurrent, in full remission: Secondary | ICD-10-CM

## 2018-10-15 ENCOUNTER — Other Ambulatory Visit: Payer: Self-pay | Admitting: Nurse Practitioner

## 2018-10-15 DIAGNOSIS — E034 Atrophy of thyroid (acquired): Secondary | ICD-10-CM

## 2018-10-17 ENCOUNTER — Telehealth: Payer: Self-pay | Admitting: Nurse Practitioner

## 2018-10-17 NOTE — Telephone Encounter (Signed)
Spoke with patient.  She has 200 mcg and 50 mcg tablets.  Wants to make sure she is taking the right dosage.  Advised per MMM last lab result note she is to be taking levothyroxine 200 mcg until her next labs are drawn 11/14/2018.  Patient expressed understanding.

## 2018-11-14 ENCOUNTER — Ambulatory Visit (INDEPENDENT_AMBULATORY_CARE_PROVIDER_SITE_OTHER): Payer: Medicare Other | Admitting: Nurse Practitioner

## 2018-11-14 ENCOUNTER — Encounter: Payer: Self-pay | Admitting: Nurse Practitioner

## 2018-11-14 ENCOUNTER — Other Ambulatory Visit: Payer: Self-pay

## 2018-11-14 DIAGNOSIS — R5383 Other fatigue: Secondary | ICD-10-CM

## 2018-11-14 DIAGNOSIS — E034 Atrophy of thyroid (acquired): Secondary | ICD-10-CM | POA: Diagnosis not present

## 2018-11-14 DIAGNOSIS — H00014 Hordeolum externum left upper eyelid: Secondary | ICD-10-CM | POA: Diagnosis not present

## 2018-11-14 MED ORDER — ERYTHROMYCIN 5 MG/GM OP OINT: 1.0000 "application " | TOPICAL_OINTMENT | Freq: Every day | OPHTHALMIC | 0 refills | Status: DC

## 2018-11-14 NOTE — Progress Notes (Signed)
Patient ID: Kimberly Roth, female   DOB: Oct 30, 1945, 73 y.o.   MRN: [id number]    Virtual Visit via telephone Note  I connected with [name] on 11/14/18 at 2:00 PM by telephone and verified that I am speaking with the correct person using two identifiers. PATIRICA Roth is currently located at home  and her husband is currently with her during visit. The provider, [name] Done, FNP is located in their office at time of visit.  I discussed the limitations, risks, security and privacy concerns of performing an evaluation and management service by telephone and the availability of in person appointments. I also discussed with the patient that there may be a patient responsible charge related to this service. The patient expressed understanding and agreed to proceed.   History and Present Illness:   Chief Complaint: Fatigue (follow up)   HPI Patient was seen in office on 10/03/18 c/o fatigue. She has a history of low hgb and she wanted it checked.  Her hgb was 11.0 and her tsh was 0.231. she was told to continue the 236mcg tablet daily and to hold 57mcg tablet that she was taking. Today she says that she is still tired, but she is having to take care of her husband. Says she sleeps at noght but wakes up tired.   * she is c/o stye in her eye. Has been there for 3 days and triple antibiotic ointment is not helping.  Review of Systems  Constitutional: Negative.  Negative for diaphoresis and weight loss.  Eyes: Positive for pain and redness (left upper eyelid). Negative for blurred vision and double vision.  Respiratory: Negative for shortness of breath.   Cardiovascular: Negative for chest pain, palpitations, orthopnea and leg swelling.  Gastrointestinal: Negative for abdominal pain.  Skin: Negative for rash.  Neurological: Negative for dizziness, sensory change, loss of consciousness, weakness and headaches.  Endo/Heme/Allergies: Negative for polydipsia. Does not bruise/bleed  easily.  Psychiatric/Behavioral: Negative for memory loss. The patient does not have insomnia.   All other systems reviewed and are negative.    Observations/Objective: Alert and orieneted No distress noted  Assessment and Plan: [name] in today with chief complaint of Fatigue (follow up)   1. Fatigue, unspecified type Will possibly do sleep study when things with corona settle down 2. Hypothyroidism due to acquired atrophy of thyroid Will check labs when things with corona settle down  3. Hordeolum externum of left upper eyelid Wash eye with baby shampoo - erythromycin ophthalmic ointment; Place 1 application into the left eye at bedtime.  Dispense: 3.5 g; Refill: 0   Follow Up Instructions: prn    I discussed the assessment and treatment plan with the patient. The patient was provided an opportunity to ask questions and all were answered. The patient agreed with the plan and demonstrated an understanding of the instructions.   The patient was advised to call back or seek an in-person evaluation if the symptoms worsen or if the condition fails to improve as anticipated.  The above assessment and management plan was discussed with the patient. The patient verbalized understanding of and has agreed to the management plan. Patient is aware to call the clinic if symptoms persist or worsen. Patient is aware when to return to the clinic for a follow-up visit. Patient educated on when it is appropriate to go to the emergency department.    I provided 12 minutes of non-face-to-face time during this encounter.    [name] Done, FNP

## 2018-11-16 ENCOUNTER — Other Ambulatory Visit: Payer: Self-pay | Admitting: Nurse Practitioner

## 2018-11-21 ENCOUNTER — Other Ambulatory Visit: Payer: Self-pay | Admitting: Nurse Practitioner

## 2018-11-21 MED ORDER — BUPROPION HCL ER (XL) 150 MG PO TB24: 150.0000 mg | ORAL_TABLET | Freq: Every day | ORAL | 0 refills | Status: DC

## 2018-12-18 ENCOUNTER — Other Ambulatory Visit: Payer: Self-pay | Admitting: Nurse Practitioner

## 2018-12-18 DIAGNOSIS — F3342 Major depressive disorder, recurrent, in full remission: Secondary | ICD-10-CM

## 2018-12-19 MED ORDER — CHLORDIAZEPOXIDE HCL 25 MG PO CAPS: ORAL_CAPSULE | ORAL | 0 refills | Status: DC

## 2019-01-26 ENCOUNTER — Other Ambulatory Visit: Payer: Self-pay | Admitting: Nurse Practitioner

## 2019-01-26 DIAGNOSIS — F3342 Major depressive disorder, recurrent, in full remission: Secondary | ICD-10-CM

## 2019-02-13 ENCOUNTER — Other Ambulatory Visit: Payer: Self-pay | Admitting: Nurse Practitioner

## 2019-02-16 ENCOUNTER — Other Ambulatory Visit: Payer: Self-pay | Admitting: Nurse Practitioner

## 2019-02-16 DIAGNOSIS — F3342 Major depressive disorder, recurrent, in full remission: Secondary | ICD-10-CM

## 2019-02-17 ENCOUNTER — Other Ambulatory Visit: Payer: Self-pay | Admitting: *Deleted

## 2019-02-17 DIAGNOSIS — F3342 Major depressive disorder, recurrent, in full remission: Secondary | ICD-10-CM

## 2019-02-17 MED ORDER — CHLORDIAZEPOXIDE HCL 25 MG PO CAPS: 25.0000 mg | ORAL_CAPSULE | Freq: Three times a day (TID) | ORAL | 0 refills | Status: DC

## 2019-03-24 ENCOUNTER — Other Ambulatory Visit: Payer: Self-pay | Admitting: Nurse Practitioner

## 2019-03-24 DIAGNOSIS — F3342 Major depressive disorder, recurrent, in full remission: Secondary | ICD-10-CM

## 2019-04-24 ENCOUNTER — Other Ambulatory Visit: Payer: Self-pay | Admitting: Nurse Practitioner

## 2019-04-24 DIAGNOSIS — F3342 Major depressive disorder, recurrent, in full remission: Secondary | ICD-10-CM

## 2019-04-24 NOTE — Telephone Encounter (Signed)
Apt scheduled.  

## 2019-05-01 ENCOUNTER — Telehealth: Payer: Self-pay | Admitting: Nurse Practitioner

## 2019-05-01 NOTE — Telephone Encounter (Signed)
Left detailed message for patient to go ahead and come in for appt as long as she doesn't have any Covid symptoms

## 2019-05-02 ENCOUNTER — Ambulatory Visit (INDEPENDENT_AMBULATORY_CARE_PROVIDER_SITE_OTHER): Payer: Medicare Other | Admitting: Nurse Practitioner

## 2019-05-02 ENCOUNTER — Other Ambulatory Visit: Payer: Self-pay

## 2019-05-02 ENCOUNTER — Encounter: Payer: Self-pay | Admitting: Nurse Practitioner

## 2019-05-02 VITALS — BP 134/82 | HR 73 | Temp 98.0°F | Resp 16 | Ht 64.0 in | Wt 131.0 lb

## 2019-05-02 DIAGNOSIS — F411 Generalized anxiety disorder: Secondary | ICD-10-CM

## 2019-05-02 DIAGNOSIS — E034 Atrophy of thyroid (acquired): Secondary | ICD-10-CM | POA: Diagnosis not present

## 2019-05-02 DIAGNOSIS — R42 Dizziness and giddiness: Secondary | ICD-10-CM

## 2019-05-02 DIAGNOSIS — M329 Systemic lupus erythematosus, unspecified: Secondary | ICD-10-CM | POA: Diagnosis not present

## 2019-05-02 DIAGNOSIS — Z23 Encounter for immunization: Secondary | ICD-10-CM

## 2019-05-02 DIAGNOSIS — F3342 Major depressive disorder, recurrent, in full remission: Secondary | ICD-10-CM

## 2019-05-02 DIAGNOSIS — E7800 Pure hypercholesterolemia, unspecified: Secondary | ICD-10-CM | POA: Diagnosis not present

## 2019-05-02 MED ORDER — CHLORDIAZEPOXIDE HCL 25 MG PO CAPS: ORAL_CAPSULE | ORAL | 5 refills | Status: DC

## 2019-05-02 MED ORDER — CHLORDIAZEPOXIDE HCL 25 MG PO CAPS: 25.0000 mg | ORAL_CAPSULE | Freq: Four times a day (QID) | ORAL | 5 refills | Status: DC | PRN

## 2019-05-02 MED ORDER — ATORVASTATIN CALCIUM 40 MG PO TABS: 40.0000 mg | ORAL_TABLET | Freq: Every day | ORAL | 5 refills | Status: DC

## 2019-05-02 MED ORDER — BUPROPION HCL ER (XL) 150 MG PO TB24: ORAL_TABLET | ORAL | 1 refills | Status: DC

## 2019-05-02 MED ORDER — MECLIZINE HCL 25 MG PO TABS: 25.0000 mg | ORAL_TABLET | Freq: Three times a day (TID) | ORAL | 0 refills | Status: DC | PRN

## 2019-05-02 NOTE — Progress Notes (Addendum)
Subjective:    Patient ID: Kimberly Roth, female    DOB: 03-18-1946, 73 y.o.   MRN: [id number]   Chief Complaint: No chief complaint on file.    HPI:  1. Pure hypercholesterolemia She triss to watch her diet. Stays active and does  dedicated exercise daily. Still takes lipitor daily Lab Results  Component Value Date   CHOL 301 (H) 05/03/2018   HDL 92 05/03/2018   LDLCALC 198 (H) 05/03/2018   TRIG 55 05/03/2018   CHOLHDL 3.3 05/03/2018     2. Recurrent major depressive disorder, in full remission [facility]) She is currently on librium and wellbutrin. Combination is working well for her. She is under a lot of stress at home due to the health of her husband. Depression screen [facility] 2/9 05/02/2019 10/03/2018 05/03/2018  Decreased Interest 1 0 0  Down, Depressed, Hopeless 1 0 0  PHQ - 2 Score 2 0 0  Altered sleeping 3 - -  Tired, decreased energy 3 - -  Change in appetite 1 - -  Feeling bad or failure about yourself  0 - -  Trouble concentrating 0 - -  Moving slowly or fidgety/restless 0 - -  Suicidal thoughts 0 - -  PHQ-9 Score 9 - -  Difficult doing work/chores Somewhat difficult - -   Having a lot of problems at her home as well as trying to take cre of a very sick husband who is not able to do much for hisself.   3. GAD (generalized anxiety disorder) stays anxious GAD 7 : Generalized Anxiety Score 05/02/2019 01/10/2018  Nervous, Anxious, on Edge 3 2  Control/stop worrying 3 3  Worry too much - different things 3 3  Trouble relaxing 3 3  Restless 3 2  Easily annoyed or irritable 3 0  Afraid - awful might happen 3 2  Total GAD 7 Score 21 15  Anxiety Difficulty - Somewhat difficult     4. Hypothyroidism Is currently taking synthroid 246mg daily. Lab Results  Component Value Date   TSH 0.231 (L) 10/03/2018     5. Systemic lupus erythematosus, unspecified SLE type, unspecified organ involvement status ([place] She is only on multivitamins and voltaren for bodiy aches.  [place] for care from him. She says that she feels tired a lot and often has a low grade fever, but otherwise is maintaining.    Outpatient Encounter Medications as of 05/02/2019  Medication Sig  . acetaminophen (TYLENOL) 325 MG tablet Take 650 mg by mouth every 4 (four) hours as needed.  . Ascorbic Acid (VITAMIN C) 1000 MG tablet Take 1,000 mg by mouth 2 (two) times daily.  .Marland Kitchenaspirin 325 MG EC tablet Take 325 mg by mouth 2 (two) times daily.  .Marland Kitchenatorvastatin (LIPITOR) 40 MG tablet Take 1 tablet (40 mg total) by mouth daily.  .Marland KitchenBIOTIN PO Take 1 tablet by mouth 2 (two) times daily.  .Marland KitchenbuPROPion (WELLBUTRIN XL) 150 MG 24 hr tablet TAKE ONE (1) TABLET EACH DAY  . calcium-vitamin D (OSCAL WITH D) 250-125 MG-UNIT per tablet Take 1 tablet by mouth 2 (two) times daily.  . chlordiazePOXIDE (LIBRIUM) 25 MG capsule TAKE ONE (1) CAPSULE THREE (3) TIMES EACH DAY  . cholecalciferol (VITAMIN D) 1000 UNITS tablet Take 1,000 Units by mouth 2 (two) times daily.  .Marland KitchenCRANBERRY EXTRACT PO Take 1 tablet by mouth 2 (two) times daily.  . diclofenac (VOLTAREN)  75 MG EC tablet Take 1 tablet (75 mg total) by mouth 2 (two) times daily. With food  . diphenhydrAMINE (BENADRYL) 25 MG tablet Take 50 mg by mouth at bedtime as needed for sleep.  Marland Kitchen erythromycin ophthalmic ointment Place 1 application into the left eye at bedtime.  Marland Kitchen levothyroxine (SYNTHROID) 200 MCG tablet TAKE ONE (1) TABLET EACH DAY  . Multiple Vitamins-Minerals (MULTIVITAMINS THER. W/MINERALS) TABS tablet Take 1 tablet by mouth 2 (two) times daily.  . multivitamin-lutein (OCUVITE-LUTEIN) CAPS capsule Take 1 capsule by mouth 2 (two) times daily.  . Omega-3 Fatty Acids (OMEGA 3 PO) Take 1 capsule by mouth 2 (two) times daily.  . Potassium Gluconate 595 MG CAPS Take 1 capsule by mouth daily.  . Thiamine HCl (VITAMIN B-1) 250 MG tablet Take 250 mg by mouth 2 (two) times daily.    Past  Surgical History:  Procedure Laterality Date  . ABDOMINAL HYSTERECTOMY  1976   no oopherectomy  . APPENDECTOMY    . BACK SURGERY    . BREAST IMPLANT REMOVAL Bilateral    Silicone allergy  . BREAST SURGERY Bilateral 1991   Mastectomy  . CATARACT EXTRACTION W/PHACO Right 10/30/2013   Procedure: CATARACT EXTRACTION PHACO AND INTRAOCULAR LENS PLACEMENT (IOC);  Surgeon: Tonny Branch, MD;  Location: [facility];  Service: Ophthalmology;  Laterality: Right;  CDE:10.13  . CATARACT EXTRACTION W/PHACO Left 11/10/2013   Procedure: CATARACT EXTRACTION PHACO AND INTRAOCULAR LENS PLACEMENT (IOC);  Surgeon: Tonny Branch, MD;  Location: [facility];  Service: Ophthalmology;  Laterality: Left;  CDE 9.71  . DILATION AND CURETTAGE OF UTERUS    . EYE SURGERY Bilateral 2015   Cataract - Dr Geoffry Paradise in [place]  . JOINT REPLACEMENT Left 2005   Knee x 2  . MASTECTOMY Bilateral   . PLACEMENT OF BREAST IMPLANTS Bilateral    Secondary to cancer  . RECONSTRUCTION / CORRECTION OF NIPPLE / AEROLA Bilateral   . TONSILLECTOMY    . WISDOM TOOTH EXTRACTION Bilateral    Extraction x 4  . WRIST SURGERY Right 20's   patient not exactly sure of reason    Family History  Problem Relation Age of Onset  . Heart failure Father   . Heart disease Father   . Heart attack Father 42  . Mental illness Mother   . Mental illness Sister        PTSD  . Osteoporosis Paternal Grandmother     New complaints: Has occasional ear pain with dizziness- started  several weeks ago. She was dizzy when she woke up this morning nad sh elaid down for awhile before sh egot up. Was a little unsteady on her feet this morning.  Social history: Lives with her aligning husband and takes care of 4 horses  Controlled substance contract: none    Review of Systems  Constitutional: Negative for activity change and appetite change.  HENT: Positive for ear pain (slight).   Eyes: Negative for pain.  Respiratory: Negative for shortness of breath.    Cardiovascular: Negative for chest pain, palpitations and leg swelling.  Gastrointestinal: Negative for abdominal pain.  Endocrine: Negative for polydipsia.  Genitourinary: Negative.   Skin: Negative for rash.  Neurological: Positive for dizziness. Negative for weakness and headaches.  Hematological: Does not bruise/bleed easily.  Psychiatric/Behavioral: Negative.   All other systems reviewed and are negative.      Objective:   Physical Exam Vitals signs and nursing note reviewed.  Constitutional:      General: She is not  in acute distress.    Appearance: Normal appearance. She is well-developed.  HENT:     Head: Normocephalic.     Nose: Nose normal.  Eyes:     Pupils: Pupils are equal, round, and reactive to light.  Neck:     Musculoskeletal: Normal range of motion and neck supple.     Vascular: No carotid bruit or JVD.  Cardiovascular:     Rate and Rhythm: Normal rate and regular rhythm.     Heart sounds: Normal heart sounds.  Pulmonary:     Effort: Pulmonary effort is normal. No respiratory distress.     Breath sounds: Normal breath sounds. No wheezing or rales.  Chest:     Chest wall: No tenderness.  Abdominal:     General: Bowel sounds are normal. There is no distension or abdominal bruit.     Palpations: Abdomen is soft. There is no hepatomegaly, splenomegaly, mass or pulsatile mass.     Tenderness: There is no abdominal tenderness.  Musculoskeletal: Normal range of motion.  Lymphadenopathy:     Cervical: No cervical adenopathy.  Skin:    General: Skin is warm and dry.  Neurological:     Mental Status: She is alert and oriented to person, place, and time.     Deep Tendon Reflexes: Reflexes are normal and symmetric.  Psychiatric:        Behavior: Behavior normal.        Thought Content: Thought content normal.        Judgment: Judgment normal.     BP 134/82 (BP Location: Left Arm)   Pulse 73   Temp 98 F (36.7 C) (Oral)   Resp 16   Ht _0  (1.626 m)    Wt 131 lb (59.4 kg)   SpO2 100%   BMI 22.49 kg/m       Assessment & Plan:  Kimberly Roth comes in today with chief complaint of Medical Management of Chronic Issues   Diagnosis and orders addressed:  1. Pure hypercholesterolemia Low fat diet - CBC with Differential/Platelet - CMP14+EGFR - Lipid panel - atorvastatin (LIPITOR) 40 MG tablet; Take 1 tablet (40 mg total) by mouth daily.  Dispense: 30 tablet; Refill: 5  2. Recurrent major depressive disorder, in full remission ([place]) Stress management encouraged Increased librium from TID to QID - chlordiazePOXIDE (LIBRIUM) 25 MG capsule; TAKE ONE (1) CAPSULE four (4) TIMES EACH DAY  Dispense: 120 capsule; Refill: 5 - buPROPion (WELLBUTRIN XL) 150 MG 24 hr tablet; TAKE ONE (1) TABLET EACH DAY  Dispense: 90 tablet; Refill: 1  3. GAD (generalized anxiety disorder)  4. Systemic lupus erythematosus, unspecified SLE type, unspecified organ involvement status ([place]) Keep follow up with specialist  5. Hypothyroidism due to acquired atrophy of thyroid Waiting on labs to see if we need to change dose of synthroid - Thyroid Panel With TSH  6. Dizziness Force fluids Rest Rise slowly from sitting to standing - meclizine (ANTIVERT) 25 MG tablet; Take 1 tablet (25 mg total) by mouth 3 (three) times daily as needed for dizziness.  Dispense: 30 tablet; Refill: 0   Labs pending Health Maintenance reviewed Diet and exercise encouraged  Follow up plan: 6 months   [name] Done, FNP

## 2019-05-02 NOTE — Patient Instructions (Signed)

## 2019-05-02 NOTE — Addendum Note (Signed)
Addended by: [name] on: 05/02/2019 12:42 PM   Modules accepted: Orders

## 2019-05-03 LAB — CBC WITH DIFFERENTIAL/PLATELET
Basophils Absolute: 0 10*3/uL (ref 0.0–0.2)
Basos: 1 %
EOS (ABSOLUTE): 0.2 10*3/uL (ref 0.0–0.4)
Eos: 5 %
Hematocrit: 33.8 % — ABNORMAL LOW (ref 34.0–46.6)
Hemoglobin: 12.1 g/dL (ref 11.1–15.9)
Immature Grans (Abs): 0 10*3/uL (ref 0.0–0.1)
Immature Granulocytes: 0 %
Lymphocytes Absolute: 1.8 10*3/uL (ref 0.7–3.1)
Lymphs: 45 %
MCH: 33 pg (ref 26.6–33.0)
MCHC: 35.8 g/dL — ABNORMAL HIGH (ref 31.5–35.7)
MCV: 92 fL (ref 79–97)
Monocytes Absolute: 0.4 10*3/uL (ref 0.1–0.9)
Monocytes: 10 %
Neutrophils Absolute: 1.6 10*3/uL (ref 1.4–7.0)
Neutrophils: 39 %
Platelets: 240 10*3/uL (ref 150–450)
RBC: 3.67 x10E6/uL — ABNORMAL LOW (ref 3.77–5.28)
RDW: 11.9 % (ref 11.7–15.4)
WBC: 4 10*3/uL (ref 3.4–10.8)

## 2019-05-03 LAB — CMP14+EGFR
ALT: 12 IU/L (ref 0–32)
AST: 23 IU/L (ref 0–40)
Albumin/Globulin Ratio: 2.3 — ABNORMAL HIGH (ref 1.2–2.2)
Albumin: 4.5 g/dL (ref 3.7–4.7)
Alkaline Phosphatase: 74 IU/L (ref 39–117)
BUN/Creatinine Ratio: 13 (ref 12–28)
BUN: 8 mg/dL (ref 8–27)
Bilirubin Total: 0.3 mg/dL (ref 0.0–1.2)
CO2: 25 mmol/L (ref 20–29)
Calcium: 9.4 mg/dL (ref 8.7–10.3)
Chloride: 96 mmol/L (ref 96–106)
Creatinine, Ser: 0.63 mg/dL (ref 0.57–1.00)
GFR calc Af Amer: 104 mL/min/{1.73_m2} (ref >59)
GFR calc non Af Amer: 90 mL/min/{1.73_m2} (ref >59)
Globulin, Total: 2 g/dL (ref 1.5–4.5)
Glucose: 79 mg/dL (ref 65–99)
Potassium: 4.4 mmol/L (ref 3.5–5.2)
Sodium: 135 mmol/L (ref 134–144)
Total Protein: 6.5 g/dL (ref 6.0–8.5)

## 2019-05-03 LAB — THYROID PANEL WITH TSH
Free Thyroxine Index: 3.7 (ref 1.2–4.9)
T3 Uptake Ratio: 31 % (ref 24–39)
T4, Total: 11.9 ug/dL (ref 4.5–12.0)
TSH: 0.064 u[IU]/mL — ABNORMAL LOW (ref 0.450–4.500)

## 2019-05-03 LAB — LIPID PANEL
Chol/HDL Ratio: 2.7 ratio (ref 0.0–4.4)
Cholesterol, Total: 239 mg/dL — ABNORMAL HIGH (ref 100–199)
HDL: 90 mg/dL (ref >39)
LDL Chol Calc (NIH): 140 mg/dL — ABNORMAL HIGH (ref 0–99)
Triglycerides: 56 mg/dL (ref 0–149)
VLDL Cholesterol Cal: 9 mg/dL (ref 5–40)

## 2019-05-05 MED ORDER — LEVOTHYROXINE SODIUM 88 MCG PO TABS: 176.0000 ug | ORAL_TABLET | Freq: Every day | ORAL | 1 refills | Status: DC

## 2019-05-05 MED ORDER — LEVOTHYROXINE SODIUM 150 MCG PO TABS: 150.0000 ug | ORAL_TABLET | Freq: Every day | ORAL | 1 refills | Status: DC

## 2019-05-05 NOTE — Addendum Note (Signed)
Addended by: [name] on: 05/05/2019 10:01 AM   Modules accepted: Orders

## 2019-05-08 ENCOUNTER — Other Ambulatory Visit: Payer: Self-pay | Admitting: *Deleted

## 2019-05-08 DIAGNOSIS — E034 Atrophy of thyroid (acquired): Secondary | ICD-10-CM

## 2019-10-21 ENCOUNTER — Telehealth: Payer: Self-pay | Admitting: Nurse Practitioner

## 2019-10-21 NOTE — Chronic Care Management (AMB) (Signed)
  Chronic Care Management   Outreach Note  10/21/2019 Name: Kimberly Roth MRN: [id number] DOB: [birth year]  Kimberly Roth is a 74 y.o. year old female who is a primary care patient of [name], FNP. I reached out to Kimberly Roth by phone today in response to a referral sent by Kimberly Roth's health plan.     An unsuccessful telephone outreach was attempted today. Left message with patient spouse to return call. The patient was referred to the case management team for assistance with care management and care coordination.   Follow Up Plan: A HIPPA compliant phone message was left for the patient providing contact information and requesting a return call. The care management team will reach out to the patient again over the next 7 days. If patient returns call to provider office, please advise to call [facility]* at [phone].  [place], [place] [address] Direct Dial: [phone] [email or web address]: [email or web address]

## 2019-10-23 NOTE — Chronic Care Management (AMB) (Signed)
  Chronic Care Management   Note  10/23/2019 Name: BIRDIE FETTY MRN: [id number] DOB: April 20, 1946  NAIMA VELDHUIZEN is a 74 y.o. year old female who is a primary care patient of [name], FNP. I reached out to [name] by phone today in response to a referral sent by Ms. Raynaldo Opitz Doenges's health plan.     Ms. Starry was given information about Chronic Care Management services today including:  1. CCM service includes personalized support from designated clinical staff supervised by her physician, including individualized plan of care and coordination with other care providers 2. 24/7 contact phone numbers for assistance for urgent and routine care needs. 3. Service will only be billed when office clinical staff spend 20 minutes or more in a month to coordinate care. 4. Only one practitioner may furnish and bill the service in a calendar month. 5. The patient may stop CCM services at any time (effective at the end of the month) by phone call to the office staff. 6. The patient will be responsible for cost sharing (co-pay) of up to 20% of the service fee (after annual deductible is met).  Patient agreed to services and verbal consent obtained.   Follow up plan: Telephone appointment with care management team member scheduled for:04/30/2020.  [place], [place] [address] Direct Dial: [phone] [email or web address]: [email or web address]

## 2019-11-12 DIAGNOSIS — R1012 Left upper quadrant pain: Secondary | ICD-10-CM | POA: Diagnosis not present

## 2019-11-12 DIAGNOSIS — K439 Ventral hernia without obstruction or gangrene: Secondary | ICD-10-CM | POA: Diagnosis not present

## 2019-11-17 DIAGNOSIS — R9389 Abnormal findings on diagnostic imaging of other specified body structures: Secondary | ICD-10-CM | POA: Diagnosis not present

## 2019-11-17 DIAGNOSIS — E279 Disorder of adrenal gland, unspecified: Secondary | ICD-10-CM | POA: Diagnosis not present

## 2019-11-17 DIAGNOSIS — K439 Ventral hernia without obstruction or gangrene: Secondary | ICD-10-CM | POA: Insufficient documentation

## 2019-11-17 DIAGNOSIS — K429 Umbilical hernia without obstruction or gangrene: Secondary | ICD-10-CM | POA: Diagnosis not present

## 2019-11-17 DIAGNOSIS — K5730 Diverticulosis of large intestine without perforation or abscess without bleeding: Secondary | ICD-10-CM | POA: Diagnosis not present

## 2019-11-24 DIAGNOSIS — E278 Other specified disorders of adrenal gland: Secondary | ICD-10-CM | POA: Diagnosis not present

## 2019-11-24 DIAGNOSIS — K439 Ventral hernia without obstruction or gangrene: Secondary | ICD-10-CM | POA: Diagnosis not present

## 2019-11-26 DIAGNOSIS — E278 Other specified disorders of adrenal gland: Secondary | ICD-10-CM | POA: Diagnosis not present

## 2019-11-28 ENCOUNTER — Encounter: Payer: Self-pay | Admitting: Family Medicine

## 2019-11-28 ENCOUNTER — Ambulatory Visit (INDEPENDENT_AMBULATORY_CARE_PROVIDER_SITE_OTHER): Payer: Medicare Other | Admitting: Family Medicine

## 2019-11-28 DIAGNOSIS — H9202 Otalgia, left ear: Secondary | ICD-10-CM | POA: Diagnosis not present

## 2019-11-28 MED ORDER — AMOXICILLIN 500 MG PO CAPS: 1000.0000 mg | ORAL_CAPSULE | Freq: Three times a day (TID) | ORAL | 0 refills | Status: DC

## 2019-11-28 NOTE — Progress Notes (Signed)
Virtual Visit via Telephone Note  I connected with Kimberly Roth on 11/28/19 at 3:06 PM by telephone and verified that I am speaking with the correct person using two identifiers. Kimberly Roth is currently located at home and nobody is currently with her during this visit. The provider, Loman Brooklyn, FNP is located in their home at time of visit.  I discussed the limitations, risks, security and privacy concerns of performing an evaluation and management service by telephone and the availability of in person appointments. I also discussed with the patient that there may be a patient responsible charge related to this service. The patient expressed understanding and agreed to proceed.  Subjective: PCP: [name], FNP  Chief Complaint  Patient presents with  . URI   Patient complains of runny nose, sore throat on the left side, left ear pain/pressure and swollen lymph nodes on the left side. Onset of symptoms was 1 week ago, gradually worsening since that time. She is drinking plenty of fluids. Evaluation to date: none. Treatment to date: peppermint & water nasal spray.  She does not smoke.    ROS: Per HPI  Current Outpatient Medications:  .  acetaminophen (TYLENOL) 325 MG tablet, Take 650 mg by mouth every 4 (four) hours as needed., Disp: , Rfl:  .  Ascorbic Acid (VITAMIN C) 1000 MG tablet, Take 1,000 mg by mouth 2 (two) times daily., Disp: , Rfl:  .  aspirin 325 MG EC tablet, Take 325 mg by mouth 2 (two) times daily., Disp: , Rfl:  .  atorvastatin (LIPITOR) 40 MG tablet, Take 1 tablet (40 mg total) by mouth daily., Disp: 30 tablet, Rfl: 5 .  BIOTIN PO, Take 1 tablet by mouth 2 (two) times daily., Disp: , Rfl:  .  buPROPion (WELLBUTRIN XL) 150 MG 24 hr tablet, TAKE ONE (1) TABLET EACH DAY, Disp: 90 tablet, Rfl: 1 .  calcium-vitamin D (OSCAL WITH D) 250-125 MG-UNIT per tablet, Take 1 tablet by mouth 2 (two) times daily., Disp: , Rfl:  .  chlordiazePOXIDE (LIBRIUM) 25 MG  capsule, Take 1 capsule (25 mg total) by mouth 4 (four) times daily as needed for anxiety., Disp: 120 capsule, Rfl: 5 .  cholecalciferol (VITAMIN D) 1000 UNITS tablet, Take 1,000 Units by mouth 2 (two) times daily., Disp: , Rfl:  .  CRANBERRY EXTRACT PO, Take 1 tablet by mouth 2 (two) times daily., Disp: , Rfl:  .  diclofenac (VOLTAREN) 75 MG EC tablet, Take 1 tablet (75 mg total) by mouth 2 (two) times daily. With food, Disp: 60 tablet, Rfl: 0 .  diphenhydrAMINE (BENADRYL) 25 MG tablet, Take 50 mg by mouth at bedtime as needed for sleep., Disp: , Rfl:  .  levothyroxine (SYNTHROID) 88 MCG tablet, Take 2 tablets (176 mcg total) by mouth daily., Disp: 180 tablet, Rfl: 1 .  meclizine (ANTIVERT) 25 MG tablet, Take 1 tablet (25 mg total) by mouth 3 (three) times daily as needed for dizziness., Disp: 30 tablet, Rfl: 0 .  Multiple Vitamins-Minerals (MULTIVITAMINS THER. W/MINERALS) TABS tablet, Take 1 tablet by mouth 2 (two) times daily., Disp: , Rfl:  .  multivitamin-lutein (OCUVITE-LUTEIN) CAPS capsule, Take 1 capsule by mouth 2 (two) times daily., Disp: , Rfl:  .  Omega-3 Fatty Acids (OMEGA 3 PO), Take 1 capsule by mouth 2 (two) times daily., Disp: , Rfl:  .  Potassium Gluconate 595 MG CAPS, Take 1 capsule by mouth daily., Disp: , Rfl:  .  Thiamine HCl (VITAMIN  B-1) 250 MG tablet, Take 250 mg by mouth 2 (two) times daily., Disp: , Rfl:   Allergies  Allergen Reactions  . Codeine     Horrible headaches, nightmares   . Contrast Media [Iodinated Diagnostic Agents]     Respiratory arrest.   . Plaquenil [Hydroxychloroquine Sulfate]     Headaches/night terrors   . Tape     "can tolerate paper tape for a short time"  . Morphine And Related     Respiratory arrest.    Past Medical History:  Diagnosis Date  . Anxiety   . Breast cancer ([place])    breast bilateral  . Cataract   . History of shingles   . Hyperlipidemia   . PTSD (post-traumatic stress disorder)    related to childhood trauma  .  Systemic lupus ([place])   . Uterine cancer ([place])     Observations/Objective: A&O  No respiratory distress or wheezing audible over the phone Mood, judgement, and thought processes all WNL  Assessment and Plan: 1. Left ear pain - Treating as acute otitis media.  - amoxicillin (AMOXIL) 500 MG capsule; Take 2 capsules (1,000 mg total) by mouth 3 (three) times daily for 7 days.  Dispense: 42 capsule; Refill: 0   Follow Up Instructions:  I discussed the assessment and treatment plan with the patient. The patient was provided an opportunity to ask questions and all were answered. The patient agreed with the plan and demonstrated an understanding of the instructions.   The patient was advised to call back or seek an in-person evaluation if the symptoms worsen or if the condition fails to improve as anticipated.  The above assessment and management plan was discussed with the patient. The patient verbalized understanding of and has agreed to the management plan. Patient is aware to call the clinic if symptoms persist or worsen. Patient is aware when to return to the clinic for a follow-up visit. Patient educated on when it is appropriate to go to the emergency department.   Time call ended: 3:16 PM  I provided 12 minutes of non-face-to-face time during this encounter.  Hendricks Limes, MSN, APRN, FNP-C [facility] 11/28/19

## 2019-11-29 ENCOUNTER — Encounter: Payer: Self-pay | Admitting: Nurse Practitioner

## 2019-11-29 DIAGNOSIS — E278 Other specified disorders of adrenal gland: Secondary | ICD-10-CM | POA: Diagnosis not present

## 2019-12-02 ENCOUNTER — Encounter: Payer: Self-pay | Admitting: Nurse Practitioner

## 2019-12-02 ENCOUNTER — Ambulatory Visit (INDEPENDENT_AMBULATORY_CARE_PROVIDER_SITE_OTHER): Payer: Medicare Other | Admitting: Nurse Practitioner

## 2019-12-02 DIAGNOSIS — M329 Systemic lupus erythematosus, unspecified: Secondary | ICD-10-CM

## 2019-12-02 DIAGNOSIS — E7800 Pure hypercholesterolemia, unspecified: Secondary | ICD-10-CM | POA: Diagnosis not present

## 2019-12-02 DIAGNOSIS — F411 Generalized anxiety disorder: Secondary | ICD-10-CM

## 2019-12-02 DIAGNOSIS — F3342 Major depressive disorder, recurrent, in full remission: Secondary | ICD-10-CM

## 2019-12-02 DIAGNOSIS — E034 Atrophy of thyroid (acquired): Secondary | ICD-10-CM | POA: Diagnosis not present

## 2019-12-02 MED ORDER — BUPROPION HCL ER (XL) 150 MG PO TB24: ORAL_TABLET | ORAL | 1 refills | Status: DC

## 2019-12-02 MED ORDER — CHLORDIAZEPOXIDE HCL 25 MG PO CAPS: 25.0000 mg | ORAL_CAPSULE | Freq: Four times a day (QID) | ORAL | 5 refills | Status: DC | PRN

## 2019-12-02 MED ORDER — ATORVASTATIN CALCIUM 40 MG PO TABS: 40.0000 mg | ORAL_TABLET | Freq: Every day | ORAL | 5 refills | Status: DC

## 2019-12-02 MED ORDER — LEVOTHYROXINE SODIUM 88 MCG PO TABS: 176.0000 ug | ORAL_TABLET | Freq: Every day | ORAL | 1 refills | Status: DC

## 2019-12-02 NOTE — Progress Notes (Signed)
Virtual Visit via telephone Note Due to COVID-19 pandemic this visit was conducted virtually. This visit type was conducted due to national recommendations for restrictions regarding the COVID-19 Pandemic (e.g. social distancing, sheltering in place) in an effort to limit this patient's exposure and mitigate transmission in our community. All issues noted in this document were discussed and addressed.  A physical exam was not performed with this format.  I connected with Kimberly Roth on 12/02/19 at 11:45 by telephone and verified that I am speaking with the correct person using two identifiers. Kimberly Roth is currently located at home and her huband is currently with her during visit. The provider, Kimberly Hassell Done, FNP is located in their office at time of visit.  I discussed the limitations, risks, security and privacy concerns of performing an evaluation and management service by telephone and the availability of in person appointments. I also discussed with the patient that there may be a patient responsible charge related to this service. The patient expressed understanding and agreed to proceed.   History and Present Illness:   Chief Complaint: Medical Management of Chronic Issues    HPI:  1. Hypothyroidism due to acquired atrophy of thyroid No problems that she I aware of. Lab Results  Component Value Date   TSH 0.064 (L) 05/02/2019     2. Pure hypercholesterolemia Does watch diet and doe very little exercise. Is currently om lipitor. Lab Results  Component Value Date   CHOL 239 (H) 05/02/2019   HDL 90 05/02/2019   LDLCALC 140 (H) 05/02/2019   TRIG 56 05/02/2019   CHOLHDL 2.7 05/02/2019     3. Recurrent major depressive disorder, in full remission ([place]) I on wellbutrin and librium and I ok. Her brother pased away 3 week ok and she is till grieving. Depression screen [facility] 2/9 12/02/2019 05/02/2019 10/03/2018  Decreased Interest 2 1 0  Down, Depressed, Hopeless 1 1  0  PHQ - 2 Score 3 2 0  Altered sleeping 1 3 -  Tired, decreased energy 1 3 -  Change in appetite 2 1 -  Feeling bad or failure about yourself  0 0 -  Trouble concentrating 0 0 -  Moving slowly or fidgety/restless 1 0 -  Suicidal thoughts 0 0 -  PHQ-9 Score 8 9 -  Difficult doing work/chores Somewhat difficult Somewhat difficult -     4. GAD (generalized anxiety disorder) Her librium helps keep her calm. Waiting on test results he has had run to make sure she does not have cancer has her worried.  5. Systemic lupus erythematosus, unspecified SLE type, unspecified organ involvement status ([place]) Her on in law treat her. She is currentlyy not taking any medication    Outpatient Encounter Medications as of 12/02/2019  Medication Sig  . acetaminophen (TYLENOL) 325 MG tablet Take 650 mg by mouth every 4 (four) hours as needed.  Marland Kitchen amoxicillin (AMOXIL) 500 MG capsule Take 2 capsules (1,000 mg total) by mouth 3 (three) times daily for 7 days.  . Ascorbic Acid (VITAMIN C) 1000 MG tablet Take 1,000 mg by mouth 2 (two) times daily.  Marland Kitchen aspirin 325 MG EC tablet Take 325 mg by mouth 2 (two) times daily.  Marland Kitchen atorvastatin (LIPITOR) 40 MG tablet Take 1 tablet (40 mg total) by mouth daily.  Marland Kitchen BIOTIN PO Take 1 tablet by mouth 2 (two) times daily.  Marland Kitchen buPROPion (WELLBUTRIN XL) 150 MG 24 hr tablet TAKE ONE (1) TABLET EACH DAY  . calcium-vitamin  D (OSCAL WITH D) 250-125 MG-UNIT per tablet Take 1 tablet by mouth 2 (two) times daily.  . chlordiazePOXIDE (LIBRIUM) 25 MG capsule Take 1 capsule (25 mg total) by mouth 4 (four) times daily as needed for anxiety.  . cholecalciferol (VITAMIN D) 1000 UNITS tablet Take 1,000 Units by mouth 2 (two) times daily.  Marland Kitchen CRANBERRY EXTRACT PO Take 1 tablet by mouth 2 (two) times daily.  . diclofenac (VOLTAREN) 75 MG EC tablet Take 1 tablet (75 mg total) by mouth 2 (two) times daily. With food  . diphenhydrAMINE (BENADRYL) 25 MG tablet Take 50 mg by mouth at bedtime as needed  for sleep.  Marland Kitchen levothyroxine (SYNTHROID) 88 MCG tablet Take 2 tablets (176 mcg total) by mouth daily.  . meclizine (ANTIVERT) 25 MG tablet Take 1 tablet (25 mg total) by mouth 3 (three) times daily as needed for dizziness.  . Multiple Vitamins-Minerals (MULTIVITAMINS THER. W/MINERALS) TABS tablet Take 1 tablet by mouth 2 (two) times daily.  . multivitamin-lutein (OCUVITE-LUTEIN) CAPS capsule Take 1 capsule by mouth 2 (two) times daily.  . Omega-3 Fatty Acids (OMEGA 3 PO) Take 1 capsule by mouth 2 (two) times daily.  . Potassium Gluconate 595 MG CAPS Take 1 capsule by mouth daily.  . Thiamine HCl (VITAMIN B-1) 250 MG tablet Take 250 mg by mouth 2 (two) times daily.    Past Surgical History:  Procedure Laterality Date  . ABDOMINAL HYSTERECTOMY  1976   no oopherectomy  . APPENDECTOMY    . BACK SURGERY    . BREAST IMPLANT REMOVAL Bilateral    Silicone allergy  . BREAST SURGERY Bilateral 1991   Mastectomy  . CATARACT EXTRACTION W/PHACO Right 10/30/2013   Procedure: CATARACT EXTRACTION PHACO AND INTRAOCULAR LENS PLACEMENT (IOC);  Surgeon: Tonny Branch, MD;  Location: [facility];  Service: Ophthalmology;  Laterality: Right;  CDE:10.13  . CATARACT EXTRACTION W/PHACO Left 11/10/2013   Procedure: CATARACT EXTRACTION PHACO AND INTRAOCULAR LENS PLACEMENT (IOC);  Surgeon: Tonny Branch, MD;  Location: [facility];  Service: Ophthalmology;  Laterality: Left;  CDE 9.71  . DILATION AND CURETTAGE OF UTERUS    . EYE SURGERY Bilateral 2015   Cataract - Dr Geoffry Paradise in [place]  . JOINT REPLACEMENT Left 2005   Knee x 2  . MASTECTOMY Bilateral   . PLACEMENT OF BREAST IMPLANTS Bilateral    Secondary to cancer  . RECONSTRUCTION / CORRECTION OF NIPPLE / AEROLA Bilateral   . TONSILLECTOMY    . WISDOM TOOTH EXTRACTION Bilateral    Extraction x 4  . WRIST SURGERY Right 20's   patient not exactly sure of reason    Family History  Problem Relation Age of Onset  . Heart failure Father   . Heart disease Father   . Heart  attack Father 70  . Mental illness Mother   . Mental illness Sister        PTSD  . Osteoporosis Paternal Grandmother     New complaints: Had a lump in upper abdomen  A month or so ago. Has been getting larger.dx with hiatal hernia. She had CT scan Roth at [facility]. He is waiting on report so they can decide where to proceed from there. They do not think this Is cancer related though. She has developed back pain in the mean time. Rates pain 8/10. She is taking white willow burch which help alleviate her pain so she can function.  Social history: Lives with her husband who Is  sick.  Controlled substance  contract: n/a    Review of Systems  Constitutional: Negative for diaphoresis and weight loss.  Eyes: Negative for blurred vision, double vision and pain.  Respiratory: Negative for shortness of breath.   Cardiovascular: Negative for chest pain, palpitations, orthopnea and leg swelling.  Gastrointestinal: Negative for abdominal pain.       Abdominal mass  Skin: Negative for rash.  Neurological: Negative for dizziness, sensory change, loss of consciousness, weakness and headaches.  Endo/Heme/Allergies: Negative for polydipsia. Does not bruise/bleed easily.  Psychiatric/Behavioral: Negative for memory loss. The patient does not have insomnia.   All other systems reviewed and are negative.    Observations/Objective: Alert and oriented- answers all questions appropriately No distress Very talkative   Assessment and Plan: MANDEEP MATZA comes in today with chief complaint of Medical Management of Chronic Issues   Diagnosis and orders addressed:  1. Hypothyroidism due to acquired atrophy of thyroid Will [facility] to get lab [facility]  2. Pure hypercholesterolemia Low fat diet encouraged - atorvastatin (LIPITOR) 40 MG tablet; Take 1 tablet (40 mg total) by mouth daily.  Dispense: 30 tablet; Refill: 5  3. Recurrent major depressive disorder, in full  remission ([facility]) Stress mnagament - chlordiazePOXIDE (LIBRIUM) 25 MG capsule; Take 1 capsule (25 mg total) by mouth 4 (four) times daily as needed for anxiety.  Dispense: 120 capsule; Refill: 5 - buPROPion (WELLBUTRIN XL) 150 MG 24 hr tablet; TAKE ONE (1) TABLET EACH DAY  Dispense: 90 tablet; Refill: 1  4. GAD (generalized anxiety disorder) Continue current med Continue grief couneling  5. Systemic lupus erythematosus, unspecified SLE type, unspecified organ involvement status ([place]) Keep follow up with specialist  patient will let me know what urgery he will have and what tet reukt are.  Labs pending Health Maintenance reviewed Diet and exercise encouraged  Follow up plan: 6 months    I discussed the assessment and treatment plan with the patient. The patient was provided an opportunity to ask questions and all were answered. The patient agreed with the plan and demonstrated an understanding of the instructions.   The patient was advised to call back or seek an in-person evaluation if the symptoms worsen or if the condition fails to improve as anticipated.  The above assessment and management plan was discussed with the patient. The patient verbalized understanding of and has agreed to the management plan. Patient is aware to call the clinic if symptoms persist or worsen. Patient is aware when to return to the clinic for a follow-up visit. Patient educated on when it is appropriate to go to the emergency department.   Time call ended:  12:20  I provided 35 minutes of non-face-to-face time during this encounter.    Kimberly Hassell Done, FNP

## 2019-12-22 DIAGNOSIS — Z01812 Encounter for preprocedural laboratory examination: Secondary | ICD-10-CM | POA: Diagnosis not present

## 2019-12-22 DIAGNOSIS — K439 Ventral hernia without obstruction or gangrene: Secondary | ICD-10-CM | POA: Diagnosis not present

## 2019-12-23 DIAGNOSIS — Z01818 Encounter for other preprocedural examination: Secondary | ICD-10-CM | POA: Diagnosis not present

## 2019-12-25 DIAGNOSIS — Z853 Personal history of malignant neoplasm of breast: Secondary | ICD-10-CM | POA: Diagnosis not present

## 2019-12-25 DIAGNOSIS — Z79899 Other long term (current) drug therapy: Secondary | ICD-10-CM | POA: Diagnosis not present

## 2019-12-25 DIAGNOSIS — Z9010 Acquired absence of unspecified breast and nipple: Secondary | ICD-10-CM | POA: Diagnosis not present

## 2019-12-25 DIAGNOSIS — F419 Anxiety disorder, unspecified: Secondary | ICD-10-CM | POA: Diagnosis not present

## 2019-12-25 DIAGNOSIS — Z91041 Radiographic dye allergy status: Secondary | ICD-10-CM | POA: Diagnosis not present

## 2019-12-25 DIAGNOSIS — K439 Ventral hernia without obstruction or gangrene: Secondary | ICD-10-CM | POA: Diagnosis not present

## 2019-12-25 DIAGNOSIS — M329 Systemic lupus erythematosus, unspecified: Secondary | ICD-10-CM | POA: Diagnosis not present

## 2019-12-25 DIAGNOSIS — E785 Hyperlipidemia, unspecified: Secondary | ICD-10-CM | POA: Diagnosis not present

## 2019-12-25 DIAGNOSIS — E039 Hypothyroidism, unspecified: Secondary | ICD-10-CM | POA: Diagnosis not present

## 2019-12-25 DIAGNOSIS — Z885 Allergy status to narcotic agent status: Secondary | ICD-10-CM | POA: Diagnosis not present

## 2019-12-25 DIAGNOSIS — F329 Major depressive disorder, single episode, unspecified: Secondary | ICD-10-CM | POA: Diagnosis not present

## 2019-12-25 DIAGNOSIS — R1013 Epigastric pain: Secondary | ICD-10-CM | POA: Diagnosis not present

## 2020-01-02 DIAGNOSIS — R350 Frequency of micturition: Secondary | ICD-10-CM | POA: Diagnosis not present

## 2020-01-05 ENCOUNTER — Telehealth: Payer: Self-pay | Admitting: Nurse Practitioner

## 2020-01-05 ENCOUNTER — Ambulatory Visit (INDEPENDENT_AMBULATORY_CARE_PROVIDER_SITE_OTHER): Payer: Medicare Other | Admitting: Family Medicine

## 2020-01-05 ENCOUNTER — Encounter: Payer: Self-pay | Admitting: Family Medicine

## 2020-01-05 DIAGNOSIS — N3000 Acute cystitis without hematuria: Secondary | ICD-10-CM | POA: Diagnosis not present

## 2020-01-05 NOTE — Progress Notes (Signed)
Virtual Visit via telephone Note  I connected with Kimberly Roth on 01/05/20 at 0906 by telephone and verified that I am speaking with the correct person using two identifiers. Kimberly Roth is currently located at home and no other people are currently with her during visit. The provider, Fransisca Kaufmann Takyah Ciaramitaro, MD is located in their office at time of visit.  Call ended at 0915  I discussed the limitations, risks, security and privacy concerns of performing an evaluation and management service by telephone and the availability of in person appointments. I also discussed with the patient that there may be a patient responsible charge related to this service. The patient expressed understanding and agreed to proceed.   History and Present Illness: Patient has been having symptoms of UTI that started recently.  She went to [facility] clinic and did a U/a and got keflex and has been taking that for 3 days and symptoms have improved.  She had dysuria and frequency and it developed after the surgery.  She is starting to feel a lot better and she has been doing well with this and improving since the surgery.  She feels the   No diagnosis found.  Outpatient Encounter Medications as of 01/05/2020  Medication Sig  . acetaminophen (TYLENOL) 325 MG tablet Take 650 mg by mouth every 4 (four) hours as needed.  . Ascorbic Acid (VITAMIN C) 1000 MG tablet Take 1,000 mg by mouth 2 (two) times daily.  Marland Kitchen aspirin 325 MG EC tablet Take 325 mg by mouth 2 (two) times daily.  Marland Kitchen atorvastatin (LIPITOR) 40 MG tablet Take 1 tablet (40 mg total) by mouth daily.  Marland Kitchen BIOTIN PO Take 1 tablet by mouth 2 (two) times daily.  Marland Kitchen buPROPion (WELLBUTRIN XL) 150 MG 24 hr tablet TAKE ONE (1) TABLET EACH DAY  . calcium-vitamin D (OSCAL WITH D) 250-125 MG-UNIT per tablet Take 1 tablet by mouth 2 (two) times daily.  . chlordiazePOXIDE (LIBRIUM) 25 MG capsule Take 1 capsule (25 mg total) by mouth 4 (four) times daily as needed for anxiety.  .  cholecalciferol (VITAMIN D) 1000 UNITS tablet Take 1,000 Units by mouth 2 (two) times daily.  Marland Kitchen CRANBERRY EXTRACT PO Take 1 tablet by mouth 2 (two) times daily.  . diclofenac (VOLTAREN) 75 MG EC tablet Take 1 tablet (75 mg total) by mouth 2 (two) times daily. With food  . diphenhydrAMINE (BENADRYL) 25 MG tablet Take 50 mg by mouth at bedtime as needed for sleep.  Marland Kitchen levothyroxine (SYNTHROID) 88 MCG tablet Take 2 tablets (176 mcg total) by mouth daily.  . meclizine (ANTIVERT) 25 MG tablet Take 1 tablet (25 mg total) by mouth 3 (three) times daily as needed for dizziness.  . Multiple Vitamins-Minerals (MULTIVITAMINS THER. W/MINERALS) TABS tablet Take 1 tablet by mouth 2 (two) times daily.  . multivitamin-lutein (OCUVITE-LUTEIN) CAPS capsule Take 1 capsule by mouth 2 (two) times daily.  . Omega-3 Fatty Acids (OMEGA 3 PO) Take 1 capsule by mouth 2 (two) times daily.  . Potassium Gluconate 595 MG CAPS Take 1 capsule by mouth daily.  . Thiamine HCl (VITAMIN B-1) 250 MG tablet Take 250 mg by mouth 2 (two) times daily.   No facility-administered encounter medications on file as of 01/05/2020.    Review of Systems  Constitutional: Negative for chills and fever.  Eyes: Negative for visual disturbance.  Respiratory: Negative for chest tightness and shortness of breath.   Cardiovascular: Negative for chest pain and leg swelling.  Gastrointestinal:  Negative for abdominal pain.  Genitourinary: Positive for dysuria, frequency and urgency. Negative for difficulty urinating, hematuria, vaginal bleeding, vaginal discharge and vaginal pain.  Musculoskeletal: Negative for back pain and gait problem.  Skin: Negative for rash.  Neurological: Negative for light-headedness and headaches.  Psychiatric/Behavioral: Negative for agitation and behavioral problems.  All other systems reviewed and are negative.   Observations/Objective: Patient sounds comfortable and in no acute distress  Assessment and Plan: Problem  List Items Addressed This Visit    None    Visit Diagnoses    Acute cystitis without hematuria    -  Primary       Follow up plan: Return if symptoms worsen or fail to improve.     I discussed the assessment and treatment plan with the patient. The patient was provided an opportunity to ask questions and all were answered. The patient agreed with the plan and demonstrated an understanding of the instructions.   The patient was advised to call back or seek an in-person evaluation if the symptoms worsen or if the condition fails to improve as anticipated.  The above assessment and management plan was discussed with the patient. The patient verbalized understanding of and has agreed to the management plan. Patient is aware to call the clinic if symptoms persist or worsen. Patient is aware when to return to the clinic for a follow-up visit. Patient educated on when it is appropriate to go to the emergency department.    I provided 9 minutes of non-face-to-face time during this encounter.    Worthy Rancher, MD

## 2020-01-05 NOTE — Telephone Encounter (Signed)
That would be fine 

## 2020-01-07 NOTE — Telephone Encounter (Signed)
Left message to please call our office to decide about provider change.  Details from Dr . Marjean Donna  message left on her voice mail.

## 2020-01-07 NOTE — Telephone Encounter (Signed)
She's on Librium.  This is controlled.  Would she be willing to come off if she switches?  Otherwise, would reocmmend sticking with current PCP.

## 2020-01-12 ENCOUNTER — Encounter (INDEPENDENT_AMBULATORY_CARE_PROVIDER_SITE_OTHER): Payer: Medicare Other | Admitting: Ophthalmology

## 2020-01-14 ENCOUNTER — Other Ambulatory Visit: Payer: Self-pay | Admitting: *Deleted

## 2020-01-14 ENCOUNTER — Ambulatory Visit (INDEPENDENT_AMBULATORY_CARE_PROVIDER_SITE_OTHER): Payer: Medicare Other | Admitting: Nurse Practitioner

## 2020-01-14 ENCOUNTER — Other Ambulatory Visit: Payer: Self-pay

## 2020-01-14 ENCOUNTER — Encounter: Payer: Self-pay | Admitting: Nurse Practitioner

## 2020-01-14 VITALS — BP 154/87 | HR 78 | Temp 98.1°F | Resp 20 | Ht 64.0 in | Wt 129.0 lb

## 2020-01-14 DIAGNOSIS — R300 Dysuria: Secondary | ICD-10-CM

## 2020-01-14 DIAGNOSIS — H9202 Otalgia, left ear: Secondary | ICD-10-CM

## 2020-01-14 LAB — URINALYSIS, COMPLETE
Bilirubin, UA: NEGATIVE
Glucose, UA: NEGATIVE
Ketones, UA: NEGATIVE
Leukocytes,UA: NEGATIVE
Nitrite, UA: NEGATIVE
Protein,UA: NEGATIVE
RBC, UA: NEGATIVE
Specific Gravity, UA: 1.015 (ref 1.005–1.030)
Urobilinogen, Ur: 0.2 mg/dL (ref 0.2–1.0)
pH, UA: 7 (ref 5.0–7.5)

## 2020-01-14 LAB — MICROSCOPIC EXAMINATION
RBC, Urine: NONE SEEN /hpf (ref 0–2)
Renal Epithel, UA: NONE SEEN /hpf

## 2020-01-14 MED ORDER — FLUCONAZOLE 150 MG PO TABS: 150.0000 mg | ORAL_TABLET | Freq: Once | ORAL | 0 refills | Status: AC

## 2020-01-14 NOTE — Progress Notes (Signed)
   Subjective:    Patient ID: Kimberly Roth, female    DOB: 1946-06-17, 74 y.o.   MRN: [id number]   Chief Complaint: Dysuria   HPI Patient come sin c/o dysuria. Started 2 weeks ago. She had a virtual visit with DR. Dettinger on 01/05/20 and was given keflex 500mg  TID. She had a ventral hernia repair on 01/07/20. Her hernia repair dehisced slightl at top and bottom. They told her to tape it up and it will heal. She is complaining of still burning when she pees. Has som eurgency. She is drinking lots of water and that is not helping.   Review of Systems  Genitourinary: Positive for dysuria, frequency and urgency. Negative for pelvic pain.  All other systems reviewed and are negative.      Objective:   Physical Exam Vitals and nursing note reviewed.  Constitutional:      Appearance: Normal appearance.  HENT:     Left Ear: Tympanic membrane, ear canal and external ear normal.  Cardiovascular:     Rate and Rhythm: Regular rhythm.     Heart sounds: Normal heart sounds.  Pulmonary:     Effort: Pulmonary effort is normal.     Breath sounds: Normal breath sounds.  Abdominal:     Tenderness: There is no right CVA tenderness or left CVA tenderness.  Skin:    General: Skin is warm.  Neurological:     General: No focal deficit present.     Mental Status: She is alert and oriented to person, place, and time.  Psychiatric:        Mood and Affect: Mood normal.        Behavior: Behavior normal.   BP (!) 154/87   Pulse 78   Temp 98.1 F (36.7 C) (Temporal)   Resp 20   Ht 5\' 4"  (1.626 m)   Wt 129 lb (58.5 kg)   BMI 22.14 kg/m          Assessment & Plan:  [name] in today with chief complaint of Dysuria   1. Dysuria Force fluids- cranberry juice Burning may be coming form yeast- will treat and see if helps  - Urinalysis, Complete - fluconazole (DIFLUCAN) 150 MG tablet; Take 1 tablet (150 mg total) by mouth once for 1 dose.  Dispense: 1 tablet; Refill: 0 - Urine  Culture    The above assessment and management plan was discussed with the patient. The patient verbalized understanding of and has agreed to the management plan. Patient is aware to call the clinic if symptoms persist or worsen. Patient is aware when to return to the clinic for a follow-up visit. Patient educated on when it is appropriate to go to the emergency department.   [name] Done, FNP

## 2020-01-17 LAB — URINE CULTURE

## 2020-01-21 ENCOUNTER — Other Ambulatory Visit: Payer: Self-pay

## 2020-01-21 ENCOUNTER — Encounter (INDEPENDENT_AMBULATORY_CARE_PROVIDER_SITE_OTHER): Payer: Medicare Other | Admitting: Ophthalmology

## 2020-01-21 DIAGNOSIS — H5340 Unspecified visual field defects: Secondary | ICD-10-CM

## 2020-01-21 DIAGNOSIS — H43813 Vitreous degeneration, bilateral: Secondary | ICD-10-CM

## 2020-01-21 DIAGNOSIS — G453 Amaurosis fugax: Secondary | ICD-10-CM

## 2020-02-06 ENCOUNTER — Other Ambulatory Visit: Payer: Self-pay | Admitting: Nurse Practitioner

## 2020-02-18 ENCOUNTER — Ambulatory Visit (INDEPENDENT_AMBULATORY_CARE_PROVIDER_SITE_OTHER): Payer: Medicare Other | Admitting: Family Medicine

## 2020-02-18 ENCOUNTER — Encounter: Payer: Self-pay | Admitting: Family Medicine

## 2020-02-18 DIAGNOSIS — J40 Bronchitis, not specified as acute or chronic: Secondary | ICD-10-CM | POA: Diagnosis not present

## 2020-02-18 DIAGNOSIS — J329 Chronic sinusitis, unspecified: Secondary | ICD-10-CM | POA: Diagnosis not present

## 2020-02-18 MED ORDER — AMOXICILLIN-POT CLAVULANATE 875-125 MG PO TABS
1.0000 | ORAL_TABLET | Freq: Two times a day (BID) | ORAL | 0 refills | Status: DC
Start: 2020-02-18 — End: 2020-02-18

## 2020-02-18 MED ORDER — AMOXICILLIN-POT CLAVULANATE 875-125 MG PO TABS: 1.0000 | ORAL_TABLET | Freq: Two times a day (BID) | ORAL | 0 refills | Status: DC

## 2020-02-18 NOTE — Progress Notes (Signed)
Subjective:    Patient ID: Kimberly Roth, female    DOB: 1946/03/02, 74 y.o.   MRN: [id number]   HPI: Kimberly Roth is a 74 y.o. female presenting for 6 days of 5/10 pain  starting at the ears goes down to throat. Throat feels sore and froggy. Goes to the neck next. No relief with gargling. Nothing helps. Fever up to 101. At night has a lot of cough. Denies dyspnea. Denies loss of taste or smell.    Depression screen [facility] 2/9 01/14/2020 12/02/2019 05/02/2019 10/03/2018 05/03/2018  Decreased Interest 0 2 1 0 0  Down, Depressed, Hopeless 0 1 1 0 0  PHQ - 2 Score 0 3 2 0 0  Altered sleeping - 1 3 - -  Tired, decreased energy - 1 3 - -  Change in appetite - 2 1 - -  Feeling bad or failure about yourself  - 0 0 - -  Trouble concentrating - 0 0 - -  Moving slowly or fidgety/restless - 1 0 - -  Suicidal thoughts - 0 0 - -  PHQ-9 Score - 8 9 - -  Difficult doing work/chores - Somewhat difficult Somewhat difficult - -     Relevant past medical, surgical, family and social history reviewed and updated as indicated.  Interim medical history since our last visit reviewed. Allergies and medications reviewed and updated.  ROS:  Review of Systems   Social History   Tobacco Use  Smoking Status Never Smoker  Smokeless Tobacco Never Used       Objective:     Wt Readings from Last 3 Encounters:  01/14/20 129 lb (58.5 kg)  05/02/19 131 lb (59.4 kg)  10/03/18 130 lb (59 kg)     Exam deferred. Pt. Harboring due to COVID 19. Phone visit performed.   Assessment & Plan:   1. Sinobronchitis     Meds ordered this encounter  Medications  . DISCONTD: amoxicillin-clavulanate (AUGMENTIN) 875-125 MG tablet    Sig: Take 1 tablet by mouth 2 (two) times daily. Take all of this medication    Dispense:  20 tablet    Refill:  0  . DISCONTD: amoxicillin-clavulanate (AUGMENTIN) 875-125 MG tablet    Sig: Take 1 tablet by mouth 2 (two) times daily. Take all of this medication    Dispense:  20  tablet    Refill:  0  . amoxicillin-clavulanate (AUGMENTIN) 875-125 MG tablet    Sig: Take 1 tablet by mouth 2 (two) times daily. Take all of this medication    Dispense:  20 tablet    Refill:  0    Ms. Hegna says ahe knows Venancio Poisson asked that I tell him she will be there within 1/2 hour. Thanks, WS    No orders of the defined types were placed in this encounter.     Diagnoses and all orders for this visit:  Sinobronchitis  Other orders -     Discontinue: amoxicillin-clavulanate (AUGMENTIN) 875-125 MG tablet; Take 1 tablet by mouth 2 (two) times daily. Take all of this medication -     Discontinue: amoxicillin-clavulanate (AUGMENTIN) 875-125 MG tablet; Take 1 tablet by mouth 2 (two) times daily. Take all of this medication -     amoxicillin-clavulanate (AUGMENTIN) 875-125 MG tablet; Take 1 tablet by mouth 2 (two) times daily. Take all of this medication    Virtual Visit via telephone Note  I discussed the limitations, risks, security and privacy concerns of performing an evaluation  and management service by telephone and the availability of in person appointments. The patient was identified with two identifiers. Pt.expressed understanding and agreed to proceed. Pt. Is at home. Dr. Livia Snellen is in his office.  Follow Up Instructions:   I discussed the assessment and treatment plan with the patient. The patient was provided an opportunity to ask questions and all were answered. The patient agreed with the plan and demonstrated an understanding of the instructions.   The patient was advised to call back or seek an in-person evaluation if the symptoms worsen or if the condition fails to improve as anticipated.   Total minutes including chart review and phone contact time: 14   Follow up plan: No follow-ups on file.  Claretta Fraise, MD [place]

## 2020-02-29 ENCOUNTER — Other Ambulatory Visit: Payer: Self-pay | Admitting: Nurse Practitioner

## 2020-02-29 DIAGNOSIS — F3342 Major depressive disorder, recurrent, in full remission: Secondary | ICD-10-CM

## 2020-03-01 ENCOUNTER — Telehealth: Payer: Self-pay | Admitting: Nurse Practitioner

## 2020-03-01 MED ORDER — CHLORDIAZEPOXIDE HCL 5 MG PO CAPS: 25.0000 mg | ORAL_CAPSULE | Freq: Four times a day (QID) | ORAL | 0 refills | Status: DC

## 2020-03-01 NOTE — Telephone Encounter (Signed)
Spoke with pharmacy. Cannot get 25mg  librium - can ony get 5mg  she will have to take 5 pills qid UNTIL COME SOFF BACK PODER.

## 2020-03-09 DIAGNOSIS — H547 Unspecified visual loss: Secondary | ICD-10-CM | POA: Diagnosis not present

## 2020-03-19 ENCOUNTER — Encounter: Payer: Self-pay | Admitting: Family

## 2020-03-19 ENCOUNTER — Ambulatory Visit (INDEPENDENT_AMBULATORY_CARE_PROVIDER_SITE_OTHER): Payer: Medicare Other | Admitting: Family

## 2020-03-19 ENCOUNTER — Other Ambulatory Visit: Payer: Self-pay

## 2020-03-19 ENCOUNTER — Ambulatory Visit (INDEPENDENT_AMBULATORY_CARE_PROVIDER_SITE_OTHER): Payer: Medicare Other

## 2020-03-19 VITALS — BP 142/86 | HR 99 | Temp 98.1°F | Ht 64.0 in | Wt 130.4 lb

## 2020-03-19 DIAGNOSIS — Z1211 Encounter for screening for malignant neoplasm of colon: Secondary | ICD-10-CM

## 2020-03-19 DIAGNOSIS — M542 Cervicalgia: Secondary | ICD-10-CM

## 2020-03-19 DIAGNOSIS — S161XXA Strain of muscle, fascia and tendon at neck level, initial encounter: Secondary | ICD-10-CM

## 2020-03-19 DIAGNOSIS — W19XXXA Unspecified fall, initial encounter: Secondary | ICD-10-CM | POA: Diagnosis not present

## 2020-03-19 DIAGNOSIS — S199XXA Unspecified injury of neck, initial encounter: Secondary | ICD-10-CM | POA: Diagnosis not present

## 2020-03-19 MED ORDER — PREDNISONE 10 MG (21) PO TBPK: ORAL_TABLET | ORAL | 0 refills | Status: DC

## 2020-03-19 MED ORDER — DICLOFENAC SODIUM 75 MG PO TBEC: 75.0000 mg | DELAYED_RELEASE_TABLET | Freq: Two times a day (BID) | ORAL | 0 refills | Status: DC

## 2020-03-19 NOTE — Patient Instructions (Signed)

## 2020-03-19 NOTE — Progress Notes (Signed)
Subjective:    Patient ID: Kimberly Roth, female    DOB: [birth year], 74 y.o.   MRN: [id number]  Chief Complaint  Patient presents with  . Neck Pain    Fell 6 weeks ago.   Pt presents to the office today with neck pain that started after he fall.  Neck Pain  This is a new problem. The current episode started more than 1 month ago. The problem occurs constantly. The problem has been waxing and waning. The pain is associated with a fall. The pain is present in the right side, left side and occipital region. The quality of the pain is described as aching. The pain is at a severity of 10/10. The pain is moderate. The symptoms are aggravated by twisting. Associated symptoms include headaches and photophobia. Pertinent negatives include no visual change or weakness. Treatments tried: otc oils. The treatment provided mild relief.  Fall The accident occurred more than 1 week ago (8 weeks ago). Fall occurred: bed of rocks. The volume of blood lost was minimal. The point of impact was the face and right shoulder. The pain is moderate. Associated symptoms include headaches. Pertinent negatives include no visual change.      Review of Systems  Eyes: Positive for photophobia.  Musculoskeletal: Positive for neck pain.  Neurological: Positive for headaches. Negative for weakness.  All other systems reviewed and are negative.      Objective:   Physical Exam Vitals reviewed.  Constitutional:      General: She is not in acute distress.    Appearance: She is well-developed.  HENT:     Head: Normocephalic and atraumatic.  Eyes:     Pupils: Pupils are equal, round, and reactive to light.  Neck:     Thyroid: No thyromegaly.     Comments: Unable to rotate head greater than 45 degrees, pain with flexion and extension Cardiovascular:     Rate and Rhythm: Normal rate and regular rhythm.     Heart sounds: Normal heart sounds. No murmur heard.   Pulmonary:     Effort: Pulmonary effort is normal. No  respiratory distress.     Breath sounds: Normal breath sounds. No wheezing.  Abdominal:     General: Bowel sounds are normal. There is no distension.     Palpations: Abdomen is soft.     Tenderness: There is no abdominal tenderness.  Musculoskeletal:        General: No tenderness. Normal range of motion.  Skin:    General: Skin is warm and dry.  Neurological:     Mental Status: She is alert and oriented to person, place, and time.     Cranial Nerves: No cranial nerve deficit.     Deep Tendon Reflexes: Reflexes are normal and symmetric.  Psychiatric:        Behavior: Behavior normal.        Thought Content: Thought content normal.        Judgment: Judgment normal.       BP (!) 142/86   Pulse 99   Temp 98.1 F (36.7 C) (Temporal)   Ht 5\' 4"  (1.626 m)   Wt 130 lb 6.4 oz (59.1 kg)   BMI 22.38 kg/m      Assessment & Plan:  Kimberly Roth comes in today with chief complaint of Neck Pain (Fell 6 weeks ago.)   Diagnosis and orders addressed:  1. Neck pain Rest ROM exercises  No other NSAID's  - predniSONE (STERAPRED UNI-PAK 21  TAB) 10 MG (21) TBPK tablet; Use as directed  Dispense: 21 tablet; Refill: 0 - DG Cervical Spine Complete; Future - diclofenac (VOLTAREN) 75 MG EC tablet; Take 1 tablet (75 mg total) by mouth 2 (two) times daily. With food  Dispense: 60 tablet; Refill: 0  2. Strain of neck muscle, initial encounter - predniSONE (STERAPRED UNI-PAK 21 TAB) 10 MG (21) TBPK tablet; Use as directed  Dispense: 21 tablet; Refill: 0 - DG Cervical Spine Complete; Future - diclofenac (VOLTAREN) 75 MG EC tablet; Take 1 tablet (75 mg total) by mouth 2 (two) times daily. With food  Dispense: 60 tablet; Refill: 0  3. Fall, initial encounter  4. Colon cancer screening - Cologuard   Labs pending Health Maintenance reviewed Diet and exercise encouraged   Evelina Dun, FNP

## 2020-03-22 ENCOUNTER — Telehealth: Payer: Self-pay | Admitting: Nurse Practitioner

## 2020-03-22 MED ORDER — CHLORDIAZEPOXIDE HCL 5 MG PO CAPS: 25.0000 mg | ORAL_CAPSULE | Freq: Four times a day (QID) | ORAL | 0 refills | Status: DC

## 2020-03-22 NOTE — Telephone Encounter (Signed)
Prescription sent to pharmacy.

## 2020-03-22 NOTE — Telephone Encounter (Signed)
Resent to [facility]

## 2020-03-22 NOTE — Telephone Encounter (Signed)
Pt's husband is aware rx sent to pharmacy.

## 2020-03-23 ENCOUNTER — Other Ambulatory Visit: Payer: Self-pay | Admitting: Family

## 2020-03-23 DIAGNOSIS — M542 Cervicalgia: Secondary | ICD-10-CM

## 2020-03-26 DIAGNOSIS — Z1211 Encounter for screening for malignant neoplasm of colon: Secondary | ICD-10-CM | POA: Diagnosis not present

## 2020-03-29 ENCOUNTER — Telehealth: Payer: Self-pay | Admitting: Nurse Practitioner

## 2020-03-29 NOTE — Addendum Note (Signed)
Addended by: [name] on: 03/29/2020 02:20 PM   Modules accepted: Orders

## 2020-03-29 NOTE — Telephone Encounter (Signed)
These are capsules and will not be able to get to 25mg  dose with 10mg  tablets- call pharmacy and see what they suggest.

## 2020-03-29 NOTE — Telephone Encounter (Signed)
I know they have 10mg  in stock but she takes 25mg  and the pills are capsules and cannot get to patients dose with 10mg  capsules.

## 2020-03-29 NOTE — Telephone Encounter (Signed)
Pharmacy does have 10mg  in stock

## 2020-03-29 NOTE — Telephone Encounter (Signed)
Pt states that she is needing 10 MG chlordiazePOXIDE (LIBRIUM) called into [facility] in [place] as they are out of the 5 MG and do not have the 25 MG. Please advise.

## 2020-03-30 ENCOUNTER — Telehealth: Payer: Self-pay | Admitting: Nurse Practitioner

## 2020-03-30 MED ORDER — CHLORDIAZEPOXIDE HCL 10 MG PO CAPS: 20.0000 mg | ORAL_CAPSULE | Freq: Four times a day (QID) | ORAL | 1 refills | Status: DC

## 2020-03-30 NOTE — Telephone Encounter (Signed)
Change librium 10mg  2 po 4x a day- will change back to rgular dose once can get it.

## 2020-03-30 NOTE — Telephone Encounter (Signed)
Kimberly Roth called--chlordiazePOXIDE (LIBRIUM) 10 MG capsule is on back order and he says that [facility] has it. He will cancel the rx but he cannot transfer because its a controlled substance. Please call in to [facility].

## 2020-03-30 NOTE — Telephone Encounter (Signed)
Please let patient know that her libruim rx had to be sent to [facility]

## 2020-03-30 NOTE — Addendum Note (Signed)
Addended by: [name] on: 03/30/2020 12:11 PM   Modules accepted: Orders

## 2020-04-01 ENCOUNTER — Encounter: Payer: Self-pay | Admitting: Physical Therapy

## 2020-04-01 ENCOUNTER — Other Ambulatory Visit: Payer: Self-pay

## 2020-04-01 ENCOUNTER — Ambulatory Visit: Payer: Medicare Other | Attending: Family | Admitting: Physical Therapy

## 2020-04-01 DIAGNOSIS — R293 Abnormal posture: Secondary | ICD-10-CM | POA: Insufficient documentation

## 2020-04-01 DIAGNOSIS — M542 Cervicalgia: Secondary | ICD-10-CM | POA: Diagnosis not present

## 2020-04-01 NOTE — Therapy (Signed)
[facility] [place], [place], [address] Phone: [phone]   Fax:  [phone]  Physical Therapy Evaluation  Patient Details  Name: Kimberly Roth MRN: [id number] Date of Birth: 1945-08-10 Referring Provider (PT): Evelina Dun   Encounter Date: 04/01/2020   PT End of Session - 04/01/20 1832    Visit Number 1    Number of Visits 12    Date for PT Re-Evaluation 05/13/20    PT Start Time 0145    PT Stop Time 0237    PT Time Calculation (min) 52 min    Activity Tolerance Patient tolerated treatment well    Behavior During Therapy [facility] for tasks assessed/performed           Past Medical History:  Diagnosis Date  . Anxiety   . Breast cancer ([place])    breast bilateral  . Cataract   . History of shingles   . Hyperlipidemia   . PTSD (post-traumatic stress disorder)    related to childhood trauma  . Systemic lupus ([place])   . Uterine cancer [facility])     Past Surgical History:  Procedure Laterality Date  . ABDOMINAL HYSTERECTOMY  1976   no oopherectomy  . APPENDECTOMY    . BACK SURGERY    . BREAST IMPLANT REMOVAL Bilateral    Silicone allergy  . BREAST SURGERY Bilateral 1991   Mastectomy  . CATARACT EXTRACTION W/PHACO Right 10/30/2013   Procedure: CATARACT EXTRACTION PHACO AND INTRAOCULAR LENS PLACEMENT (IOC);  Surgeon: Tonny Branch, MD;  Location: [facility];  Service: Ophthalmology;  Laterality: Right;  CDE:10.13  . CATARACT EXTRACTION W/PHACO Left 11/10/2013   Procedure: CATARACT EXTRACTION PHACO AND INTRAOCULAR LENS PLACEMENT (IOC);  Surgeon: Tonny Branch, MD;  Location: [facility];  Service: Ophthalmology;  Laterality: Left;  CDE 9.71  . DILATION AND CURETTAGE OF UTERUS    . EYE SURGERY Bilateral 2015   Cataract - Dr Geoffry Paradise in [place]  . JOINT REPLACEMENT Left 2005   Knee x 2  . MASTECTOMY Bilateral   . PLACEMENT OF BREAST IMPLANTS Bilateral    Secondary to cancer  . RECONSTRUCTION / CORRECTION OF NIPPLE / AEROLA Bilateral    . TONSILLECTOMY    . WISDOM TOOTH EXTRACTION Bilateral    Extraction x 4  . WRIST SURGERY Right 20's   patient not exactly sure of reason    There were no vitals filed for this visit.    Subjective Assessment - 04/01/20 1807    Subjective COVID-19 screen performed prior to patient entering clinic.  The patient presents to the clinic today with c/o neck pain and headaches as the result of falling from a horse about a month ago.  Her pain is rated at a 8/10 today.  Her pain increases with movement of her neck and it sounds like "popcorn."  She also experiences pain into her left jaw region and shoulder.    Pertinent History Right shoulder surgery, wrist surgery, back surgery.    Patient Stated Goals Get out of pain.    Currently in Pain? Yes    Pain Score 8     Pain Location Neck    Pain Orientation Right;Left    Pain Descriptors / Indicators Aching;Sharp    Pain Onset 1 to 4 weeks ago    Pain Frequency Constant    Aggravating Factors  Moving neck.    Pain Relieving Factors Rest.              [facility] PT Assessment -  04/01/20 0001      Assessment   Medical Diagnosis Neck pain.    Referring Provider (PT) Evelina Dun    Onset Date/Surgical Date --   ~one month.     Precautions   Precautions Fall      Restrictions   Weight Bearing Restrictions No      Balance Screen   Has the patient fallen in the past 6 months Yes    How many times? --   "Dozens."   Has the patient had a decrease in activity level because of a fear of falling?  Yes      [place] residence      Prior Function   Level of Independence Independent      Posture/Postural Control   Posture/Postural Control Postural limitations    Postural Limitations Rounded Shoulders;Forward head      Deep Tendon Reflexes   DTR Assessment Site Biceps;Brachioradialis;Triceps    Biceps DTR 2+    Brachioradialis DTR 2+    Triceps DTR 2+      ROM / Strength   AROM / PROM /  Strength AROM;Strength      AROM   Overall AROM Comments Active right cervical rotation= 26 degrees and left= 23 degrees.  Full left shoulder range of motion.      Strength   Overall Strength Comments Left shoulder IR/ER= 4+/5.      Palpation   Palpation comment Tender to palpation over right suboccipital region.  "Very" tender to left UT and Levator Scapulae.      Ambulation/Gait   Gait Comments WNL.                      Objective measurements completed on examination: See above findings.       [facility] Adult PT Treatment/Exercise - 04/01/20 0001      Modalities   Modalities Electrical Stimulation;Moist Heat      Moist Heat Therapy   Number Minutes Moist Heat 20 Minutes    Moist Heat Location --   Left UT.     Acupuncturist Location Left UT    Electrical Stimulation Action Pre-mod.    Electrical Stimulation Parameters 80-150 Hz x 20 minutes (5 sec on and 5 sec off).    Electrical Stimulation Goals Tone;Pain                       PT Long Term Goals - 04/01/20 1828      PT LONG TERM GOAL #1   Title Independent with an HEP.    Time 6    Period Weeks    Status New      PT LONG TERM GOAL #2   Title Increase active cervical rotation to 65 degrees+ so patient can turn head more easily while driving.    Time 6    Period Weeks    Status New      PT LONG TERM GOAL #3   Title Eliminate headaches.    Time 6    Period Weeks    Status New                  Plan - 04/01/20 1823    Clinical Impression Statement The patient presents to OPPT with c/o neck pain as the result of falling from a horse about a month ago.  She has very limited cervical range of motion currently.  She is  experiencing headaches and pain radiationg into her left shoulder and jaw.  She is tender to palpation in her right suboccipital region, left UT and Levator Scapulae.  Patient will benefit from skilled physical therapy intervention to  address deficits and pain.    Personal Factors and Comorbidities Comorbidity 1;Comorbidity 2    Comorbidities Right shoulder surgery, wrist surgery, back surgery.    Examination-Activity Limitations Other    Examination-Participation Restrictions Other    Stability/Clinical Decision Making Evolving/Moderate complexity    Clinical Decision Making Low    Rehab Potential Good    PT Frequency 2x / week    PT Treatment/Interventions ADLs/Self Care Home Management;Cryotherapy;Electrical Stimulation;Ultrasound;Moist Heat;Therapeutic activities;Therapeutic exercise;Manual techniques;Patient/family education;Passive range of motion    PT Next Visit Plan Combo e'stim/US and STW/M to affected cervical musculature, postural exercise progression.    Consulted and Agree with Plan of Care Patient           Patient will benefit from skilled therapeutic intervention in order to improve the following deficits and impairments:  Pain, Postural dysfunction, Increased muscle spasms, Decreased activity tolerance  Visit Diagnosis: Cervicalgia - Plan: PT plan of care cert/re-cert  Abnormal posture - Plan: PT plan of care cert/re-cert     Problem List Patient Active Problem List   Diagnosis Date Noted  . Traumatic partial tear of right biceps tendon 06/29/2016  . Spondylosis of cervical region without myelopathy or radiculopathy 06/29/2016  . Systemic lupus ([place]) 09/02/2014  . Hyperlipidemia 09/02/2014  . Hypothyroidism 04/09/2013  . Depression 04/09/2013  . GAD (generalized anxiety disorder) 04/09/2013    Juline Sanderford, [place] MPT 04/01/2020, 6:34 PM  [facility] [address] [place], [place], [address] Phone: [phone]   Fax:  [phone]  Name: KLYNN LINNEMANN MRN: [id number] Date of Birth: [birth year]

## 2020-04-08 ENCOUNTER — Ambulatory Visit: Payer: Medicare Other | Admitting: Physical Therapy

## 2020-04-09 LAB — COLOGUARD: Cologuard: NEGATIVE

## 2020-04-15 ENCOUNTER — Ambulatory Visit: Payer: Medicare Other | Admitting: Physical Therapy

## 2020-04-15 ENCOUNTER — Other Ambulatory Visit: Payer: Self-pay

## 2020-04-15 DIAGNOSIS — R293 Abnormal posture: Secondary | ICD-10-CM | POA: Diagnosis not present

## 2020-04-15 DIAGNOSIS — M542 Cervicalgia: Secondary | ICD-10-CM | POA: Diagnosis not present

## 2020-04-15 NOTE — Therapy (Addendum)
[facility] [place], [place], [address] Phone: [phone]   Fax:  [phone]  Physical Therapy Treatment  Patient Details  Name: Kimberly Roth MRN: [id number] Date of Birth: [birth year] Referring Provider (PT): Evelina Dun   Encounter Date: 04/15/2020   PT End of Session - 04/15/20 1512     Visit Number 2    Number of Visits 12    PT Start Time 0145    PT Stop Time 0238    PT Time Calculation (min) 53 min    Activity Tolerance Patient tolerated treatment well    Behavior During Therapy [facility] for tasks assessed/performed             Past Medical History:  Diagnosis Date   Anxiety    Breast cancer ([place])    breast bilateral   Cataract    History of shingles    Hyperlipidemia    PTSD (post-traumatic stress disorder)    related to childhood trauma   Systemic lupus ([place])    Uterine cancer ([place])     Past Surgical History:  Procedure Laterality Date   ABDOMINAL HYSTERECTOMY  1976   no oopherectomy   APPENDECTOMY     BACK SURGERY     BREAST IMPLANT REMOVAL Bilateral    Silicone allergy   BREAST SURGERY Bilateral 1991   Mastectomy   CATARACT EXTRACTION W/PHACO Right 10/30/2013   Procedure: CATARACT EXTRACTION PHACO AND INTRAOCULAR LENS PLACEMENT ([place]);  Surgeon: Tonny Branch, MD;  Location: [facility];  Service: Ophthalmology;  Laterality: Right;  CDE:10.13   CATARACT EXTRACTION W/PHACO Left 11/10/2013   Procedure: CATARACT EXTRACTION PHACO AND INTRAOCULAR LENS PLACEMENT (IOC);  Surgeon: Tonny Branch, MD;  Location: [facility];  Service: Ophthalmology;  Laterality: Left;  CDE 9.71   DILATION AND CURETTAGE OF UTERUS     EYE SURGERY Bilateral 2015   Cataract - Dr Geoffry Paradise in [place] Left 2005   Knee x 2   MASTECTOMY Bilateral    PLACEMENT OF BREAST IMPLANTS Bilateral    Secondary to cancer   RECONSTRUCTION / CORRECTION OF NIPPLE / AEROLA Bilateral    TONSILLECTOMY     WISDOM TOOTH EXTRACTION  Bilateral    Extraction x 4   WRIST SURGERY Right 20's   patient not exactly sure of reason    There were no vitals filed for this visit.   Subjective Assessment - 04/15/20 1421     Subjective COVID-19 screen performed prior to patient entering clinic.  No new conmplaints.    Pertinent History Right shoulder surgery, wrist surgery, back surgery.    Patient Stated Goals Get out of pain.    Currently in Pain? Yes    Pain Score 8     Pain Location Neck    Pain Orientation Right;Left    Pain Descriptors / Indicators Aching;Sharp    Pain Onset 1 to 4 weeks ago                               [facility] Adult PT Treatment/Exercise - 04/15/20 0001       Modalities   Modalities Electrical Stimulation;Moist Heat;Ultrasound      Moist Heat Therapy   Number Minutes Moist Heat 15 Minutes    Moist Heat Location --   LT UT.     Theme park manager LT and RT UT    Electrical Stimulation Action Pre-mod.  Electrical Stimulation Parameters 80-150 Hz x 15 minutes (5 sec on and 5 sec off).    Electrical Stimulation Goals Tone;Pain      Ultrasound   Ultrasound Location RT UT.    Ultrasound Parameters Combo e'stim/US at 1.50 W/CM2 x 12 minutes.    Ultrasound Goals Pain      Manual Therapy   Manual Therapy Soft tissue mobilization    Soft tissue mobilization Very light STW/M x 11 minutes to patient's left UT.                         PT Long Term Goals - 04/01/20 1828       PT LONG TERM GOAL #1   Title Independent with an HEP.    Time 6    Period Weeks    Status New      PT LONG TERM GOAL #2   Title Increase active cervical rotation to 65 degrees+ so patient can turn head more easily while driving.    Time 6    Period Weeks    Status New      PT LONG TERM GOAL #3   Title Eliminate headaches.    Time 6    Period Weeks    Status New                   Plan - 04/15/20 1423     Clinical Impression  Statement Patient very tender over left UT and levator scapulae today.  She was also found to be tender over her right UT though the left side is worse.    Personal Factors and Comorbidities Comorbidity 1;Comorbidity 2    Comorbidities Right shoulder surgery, wrist surgery, back surgery.    Examination-Activity Limitations Other    Stability/Clinical Decision Making Evolving/Moderate complexity             Patient will benefit from skilled therapeutic intervention in order to improve the following deficits and impairments:     Visit Diagnosis: Cervicalgia  Abnormal posture     Problem List Patient Active Problem List   Diagnosis Date Noted   Traumatic partial tear of right biceps tendon 06/29/2016   Spondylosis of cervical region without myelopathy or radiculopathy 06/29/2016   Systemic lupus ([place]) 09/02/2014   Hyperlipidemia 09/02/2014   Hypothyroidism 04/09/2013   Depression 04/09/2013   GAD (generalized anxiety disorder) 04/09/2013    , [place] MPT 04/15/2020, 3:14 PM  [facility] [address] [place], [place], [address] Phone: [phone]   Fax:  [phone]  Name: Kimberly Roth MRN: [id number] Date of Birth: [birth year]  PHYSICAL THERAPY DISCHARGE SUMMARY  Visits from Start of Care: 2.  Current functional level related to goals / functional outcomes: See above.   Remaining deficits: See below.   Education / Equipment:    Patient agrees to discharge. Patient goals were not met. Patient is being discharged due to not returning since the last visit.     [place]  MPT

## 2020-04-19 ENCOUNTER — Ambulatory Visit: Payer: Medicare Other | Admitting: Orthopedic Surgery

## 2020-04-22 ENCOUNTER — Ambulatory Visit: Payer: Medicare Other | Admitting: Physical Therapy

## 2020-04-29 ENCOUNTER — Ambulatory Visit: Payer: Medicare Other | Admitting: Nurse Practitioner

## 2020-04-30 ENCOUNTER — Ambulatory Visit: Payer: Medicare Other | Admitting: *Deleted

## 2020-04-30 DIAGNOSIS — F3342 Major depressive disorder, recurrent, in full remission: Secondary | ICD-10-CM

## 2020-04-30 DIAGNOSIS — M329 Systemic lupus erythematosus, unspecified: Secondary | ICD-10-CM

## 2020-04-30 DIAGNOSIS — E034 Atrophy of thyroid (acquired): Secondary | ICD-10-CM

## 2020-04-30 NOTE — Patient Instructions (Signed)
Kimberly Roth was given information about Chronic Care Management services during initial outreach for scheduling by CCM Care Guide:  1. CCM service includes personalized support from designated clinical staff supervised by her physician, including individualized plan of care and coordination with other care providers 2. 24/7 contact phone numbers for assistance for urgent and routine care needs. 3. Service will only be billed when office clinical staff spend 20 minutes or more in a month to coordinate care. 4. Only one practitioner may furnish and bill the service in a calendar month. 5. The patient may stop CCM services at any time (effective at the end of the month) by phone call to the office staff. 6. The patient will be responsible for cost sharing (co-pay) of up to 20% of the service fee (after annual deductible is met).  Patient wishes to consider information provided and/or speak with a member of the care team before deciding about enrollment in care management services.    Follow up plan: CCM enrollment status changed to "in progress" Mailing out CCM information for patient/husband to review Will await contact from patient as they prefer to reach back out themselves  Chong Sicilian, BSN, RN-BC [place] / [place]: [phone]

## 2020-04-30 NOTE — Chronic Care Management (AMB) (Signed)
  Chronic Care Management   Initial Visit Note  04/30/2020 Name: Kimberly Roth MRN: [id number] DOB: 1946/01/13  Kimberly Roth was referred to the embedded Chronic Care Management team for care management and care coordination in relation to Hypothyroidism, PTSD, anxiety, depression, hyperlipidemia, lupus, and a hx of uterine and breast CA.  I reached out to Kimberly Roth today to discuss CCM services and management of her chronic medical conditions. My first call attempt was unsuccessful. Kimberly Roth answered the phone, but it was disconnected before we could speak. My second call was successful and I spoke with her husband. He thought the appointment had been cancelled and stated that she didn't feel like talking with anyone today. I gave a brief overview of our services and offered to mail out some more information. He was agreeable to that. I provided my direct telephone number and asked that they review the material and reach back out to me if they are interested in services or if any needs arise. He appreciated the outreach and was agreeable to this.  Follow up plan: CCM enrollment status changed to "in progress" Mailing out CCM information for patient/husband to review Will await contact from patient as they prefer to reach back out themselves  Chong Sicilian, BSN, RN-BC [place] / [place]: [phone]

## 2020-05-05 ENCOUNTER — Telehealth: Payer: Medicare Other

## 2020-05-12 ENCOUNTER — Other Ambulatory Visit: Payer: Self-pay | Admitting: Nurse Practitioner

## 2020-05-27 DIAGNOSIS — R933 Abnormal findings on diagnostic imaging of other parts of digestive tract: Secondary | ICD-10-CM | POA: Diagnosis not present

## 2020-05-27 DIAGNOSIS — Z853 Personal history of malignant neoplasm of breast: Secondary | ICD-10-CM | POA: Diagnosis not present

## 2020-05-27 DIAGNOSIS — K838 Other specified diseases of biliary tract: Secondary | ICD-10-CM | POA: Diagnosis not present

## 2020-05-27 DIAGNOSIS — E278 Other specified disorders of adrenal gland: Secondary | ICD-10-CM | POA: Diagnosis not present

## 2020-05-27 DIAGNOSIS — D3502 Benign neoplasm of left adrenal gland: Secondary | ICD-10-CM | POA: Diagnosis not present

## 2020-06-07 ENCOUNTER — Other Ambulatory Visit: Payer: Self-pay | Admitting: Nurse Practitioner

## 2020-06-07 DIAGNOSIS — F3342 Major depressive disorder, recurrent, in full remission: Secondary | ICD-10-CM

## 2020-06-09 DIAGNOSIS — E278 Other specified disorders of adrenal gland: Secondary | ICD-10-CM | POA: Diagnosis not present

## 2020-06-18 ENCOUNTER — Other Ambulatory Visit: Payer: Self-pay | Admitting: Nurse Practitioner

## 2020-06-18 ENCOUNTER — Ambulatory Visit (INDEPENDENT_AMBULATORY_CARE_PROVIDER_SITE_OTHER): Payer: Medicare Other | Admitting: Nurse Practitioner

## 2020-06-18 ENCOUNTER — Encounter: Payer: Self-pay | Admitting: Nurse Practitioner

## 2020-06-18 ENCOUNTER — Other Ambulatory Visit: Payer: Self-pay

## 2020-06-18 VITALS — BP 142/88 | HR 75 | Temp 98.0°F | Resp 20 | Ht 64.0 in | Wt 123.0 lb

## 2020-06-18 DIAGNOSIS — F411 Generalized anxiety disorder: Secondary | ICD-10-CM

## 2020-06-18 DIAGNOSIS — N63 Unspecified lump in unspecified breast: Secondary | ICD-10-CM

## 2020-06-18 DIAGNOSIS — N6310 Unspecified lump in the right breast, unspecified quadrant: Secondary | ICD-10-CM | POA: Diagnosis not present

## 2020-06-18 DIAGNOSIS — F3342 Major depressive disorder, recurrent, in full remission: Secondary | ICD-10-CM

## 2020-06-18 DIAGNOSIS — N649 Disorder of breast, unspecified: Secondary | ICD-10-CM

## 2020-06-18 DIAGNOSIS — E7800 Pure hypercholesterolemia, unspecified: Secondary | ICD-10-CM | POA: Diagnosis not present

## 2020-06-18 DIAGNOSIS — E034 Atrophy of thyroid (acquired): Secondary | ICD-10-CM

## 2020-06-18 MED ORDER — BUPROPION HCL ER (XL) 150 MG PO TB24: 150.0000 mg | ORAL_TABLET | Freq: Every day | ORAL | 1 refills | Status: DC

## 2020-06-18 MED ORDER — CHLORDIAZEPOXIDE HCL 10 MG PO CAPS: 20.0000 mg | ORAL_CAPSULE | Freq: Four times a day (QID) | ORAL | 1 refills | Status: DC

## 2020-06-18 MED ORDER — ATORVASTATIN CALCIUM 40 MG PO TABS: 40.0000 mg | ORAL_TABLET | Freq: Every day | ORAL | 1 refills | Status: DC

## 2020-06-18 MED ORDER — LEVOTHYROXINE SODIUM 88 MCG PO TABS: 176.0000 ug | ORAL_TABLET | Freq: Every day | ORAL | 1 refills | Status: DC

## 2020-06-18 NOTE — Patient Instructions (Signed)
Stress, Adult Stress is a normal reaction to life events. Stress is what you feel when life demands more than you are used to, or more than you think you can handle. Some stress can be useful, such as studying for a test or meeting a deadline at work. Stress that occurs too often or for too long can cause problems. It can affect your emotional health and interfere with relationships and normal daily activities. Too much stress can weaken your body's defense system (immune system) and increase your risk for physical illness. If you already have a medical problem, stress can make it worse. What are the causes? All sorts of life events can cause stress. An event that causes stress for one person may not be stressful for another person. Major life events, whether positive or negative, commonly cause stress. Examples include:  Losing a job or starting a new job.  Losing a loved one.  Moving to a new town or home.  Getting married or divorced.  Having a baby.  Getting injured or sick. Less obvious life events can also cause stress, especially if they occur day after day or in combination with each other. Examples include:  Working long hours.  Driving in traffic.  Caring for children.  Being in debt.  Being in a difficult relationship. What are the signs or symptoms? Stress can cause emotional symptoms, including:  Anxiety. This is feeling worried, afraid, on edge, overwhelmed, or out of control.  Anger, including irritation or impatience.  Depression. This is feeling sad, down, helpless, or guilty.  Trouble focusing, remembering, or making decisions. Stress can cause physical symptoms, including:  Aches and pains. These may affect your head, neck, back, stomach, or other areas of your body.  Tight muscles or a clenched jaw.  Low energy.  Trouble sleeping. Stress can cause unhealthy behaviors, including:  Eating to feel better (overeating) or skipping meals.  Working too  much or putting off tasks.  Smoking, drinking alcohol, or using drugs to feel better. How is this diagnosed? Stress is diagnosed through an assessment by your health care provider. He or she may diagnose this condition based on:  Your symptoms and any stressful life events.  Your medical history.  Tests to rule out other causes of your symptoms. Depending on your condition, your health care provider may refer you to a specialist for further evaluation. How is this treated?  Stress management techniques are the recommended treatment for stress. Medicine is not typically recommended for the treatment of stress. Techniques to reduce your reaction to stressful life events include:  Stress identification. Monitor yourself for symptoms of stress and identify what causes stress for you. These skills may help you to avoid or prepare for stressful events.  Time management. Set your priorities, keep a calendar of events, and learn to say no. Taking these actions can help you avoid making too many commitments. Techniques for coping with stress include:  Rethinking the problem. Try to think realistically about stressful events rather than ignoring them or overreacting. Try to find the positives in a stressful situation rather than focusing on the negatives.  Exercise. Physical exercise can release both physical and emotional tension. The key is to find a form of exercise that you enjoy and do it regularly.  Relaxation techniques. These relax the body and mind. The key is to find one or more that you enjoy and use the techniques regularly. Examples include: ? Meditation, deep breathing, or progressive relaxation techniques. ? Yoga or   tai chi. ? Biofeedback, mindfulness techniques, or journaling. ? Listening to music, being out in nature, or participating in other hobbies.  Practicing a healthy lifestyle. Eat a balanced diet, drink plenty of water, limit or avoid caffeine, and get plenty of  sleep.  Having a strong support network. Spend time with family, friends, or other people you enjoy being around. Express your feelings and talk things over with someone you trust. Counseling or talk therapy with a mental health professional may be helpful if you are having trouble managing stress on your own. Follow these instructions at home: Lifestyle   Avoid drugs.  Do not use any products that contain nicotine or tobacco, such as cigarettes, e-cigarettes, and chewing tobacco. If you need help quitting, ask your health care provider.  Limit alcohol intake to no more than 1 drink a day for nonpregnant women and 2 drinks a day for men. One drink equals 12 oz of beer, 5 oz of wine, or 1 oz of hard liquor  Do not use alcohol or drugs to relax.  Eat a balanced diet that includes fresh fruits and vegetables, whole grains, lean meats, fish, eggs, and beans, and low-fat dairy. Avoid processed foods and foods high in added fat, sugar, and salt.  Exercise at least 30 minutes on 5 or more days each week.  Get 7-8 hours of sleep each night. General instructions   Practice stress management techniques as discussed with your health care provider.  Drink enough fluid to keep your urine clear or pale yellow.  Take over-the-counter and prescription medicines only as told by your health care provider.  Keep all follow-up visits as told by your health care provider. This is important. Contact a health care provider if:  Your symptoms get worse.  You have new symptoms.  You feel overwhelmed by your problems and can no longer manage them on your own. Get help right away if:  You have thoughts of hurting yourself or others. If you ever feel like you may hurt yourself or others, or have thoughts about taking your own life, get help right away. You can go to your nearest emergency department or call:  Your local emergency services (911 in the [place]  A suicide crisis helpline, such as the  [place] at (316) 250-6172. This is open 24 hours a day. Summary  Stress is a normal reaction to life events. It can cause problems if it happens too often or for too long.  Practicing stress management techniques is the best way to treat stress.  Counseling or talk therapy with a mental health professional may be helpful if you are having trouble managing stress on your own. This information is not intended to replace advice given to you by your health care provider. Make sure you discuss any questions you have with your health care provider. Document Revised: 02/14/2019 Document Reviewed: 09/06/2016 Elsevier Patient Education  [place].

## 2020-06-18 NOTE — Addendum Note (Signed)
Addended by: [name] on: 06/18/2020 12:47 PM   Modules accepted: Orders

## 2020-06-18 NOTE — Addendum Note (Signed)
Addended by: [name] on: 06/18/2020 12:41 PM   Modules accepted: Orders
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
## 2020-06-19 LAB — CBC WITH DIFFERENTIAL/PLATELET
Basophils Absolute: 0 10*3/uL (ref 0.0–0.2)
Basos: 1 %
EOS (ABSOLUTE): 0.1 10*3/uL (ref 0.0–0.4)
Eos: 2 %
Hematocrit: 36.4 % (ref 34.0–46.6)
Hemoglobin: 12.6 g/dL (ref 11.1–15.9)
Immature Grans (Abs): 0 10*3/uL (ref 0.0–0.1)
Immature Granulocytes: 0 %
Lymphocytes Absolute: 1.5 10*3/uL (ref 0.7–3.1)
Lymphs: 29 %
MCH: 33.4 pg — ABNORMAL HIGH (ref 26.6–33.0)
MCHC: 34.6 g/dL (ref 31.5–35.7)
MCV: 97 fL (ref 79–97)
Monocytes Absolute: 0.4 10*3/uL (ref 0.1–0.9)
Monocytes: 7 %
Neutrophils Absolute: 3.2 10*3/uL (ref 1.4–7.0)
Neutrophils: 61 %
Platelets: 251 10*3/uL (ref 150–450)
RBC: 3.77 x10E6/uL (ref 3.77–5.28)
RDW: 12.1 % (ref 11.7–15.4)
WBC: 5.2 10*3/uL (ref 3.4–10.8)

## 2020-06-19 LAB — LIPID PANEL
Chol/HDL Ratio: 3.1 ratio (ref 0.0–4.4)
Cholesterol, Total: 250 mg/dL — ABNORMAL HIGH (ref 100–199)
HDL: 81 mg/dL (ref >39)
LDL Chol Calc (NIH): 152 mg/dL — ABNORMAL HIGH (ref 0–99)
Triglycerides: 98 mg/dL (ref 0–149)
VLDL Cholesterol Cal: 17 mg/dL (ref 5–40)

## 2020-06-19 LAB — CMP14+EGFR
ALT: 17 IU/L (ref 0–32)
AST: 16 IU/L (ref 0–40)
Albumin/Globulin Ratio: 2.4 — ABNORMAL HIGH (ref 1.2–2.2)
Albumin: 4.7 g/dL (ref 3.7–4.7)
Alkaline Phosphatase: 76 IU/L (ref 44–121)
BUN/Creatinine Ratio: 13 (ref 12–28)
BUN: 9 mg/dL (ref 8–27)
Bilirubin Total: 0.4 mg/dL (ref 0.0–1.2)
CO2: 27 mmol/L (ref 20–29)
Calcium: 10.3 mg/dL (ref 8.7–10.3)
Chloride: 99 mmol/L (ref 96–106)
Creatinine, Ser: 0.68 mg/dL (ref 0.57–1.00)
GFR calc Af Amer: 100 mL/min/{1.73_m2} (ref >59)
GFR calc non Af Amer: 87 mL/min/{1.73_m2} (ref >59)
Globulin, Total: 2 g/dL (ref 1.5–4.5)
Glucose: 97 mg/dL (ref 65–99)
Potassium: 4.4 mmol/L (ref 3.5–5.2)
Sodium: 137 mmol/L (ref 134–144)
Total Protein: 6.7 g/dL (ref 6.0–8.5)

## 2020-06-19 LAB — THYROID PANEL WITH TSH
Free Thyroxine Index: 4.1 (ref 1.2–4.9)
T3 Uptake Ratio: 33 % (ref 24–39)
T4, Total: 12.3 ug/dL — ABNORMAL HIGH (ref 4.5–12.0)
TSH: 0.015 u[IU]/mL — ABNORMAL LOW (ref 0.450–4.500)

## 2020-06-22 ENCOUNTER — Telehealth: Payer: Self-pay

## 2020-06-22 ENCOUNTER — Inpatient Hospital Stay (HOSPITAL_COMMUNITY): Admission: RE | Admit: 2020-06-22 | Payer: Medicare Other | Source: Ambulatory Visit

## 2020-06-22 ENCOUNTER — Ambulatory Visit (HOSPITAL_COMMUNITY): Payer: Medicare Other

## 2020-06-22 NOTE — Telephone Encounter (Signed)
Called patient to inform of appt at [facility] today - patient has another appt today and will not be able to come. R/s appt through [facility] and will notify patient

## 2020-06-28 ENCOUNTER — Telehealth: Payer: Self-pay

## 2020-06-28 NOTE — Telephone Encounter (Signed)
Called patient to notify of appt for breast imaging - notified patient of lab results also per note in chart  Cbc normal Kidney and liver function stable Ldl are still elevated- we may need to change lipitor to crestor to see if can get LDL down- does she want to try? ...  Patient would like to wait until after the holidays to change medications.

## 2020-07-02 ENCOUNTER — Other Ambulatory Visit: Payer: Self-pay

## 2020-07-02 ENCOUNTER — Ambulatory Visit (HOSPITAL_COMMUNITY)
Admission: RE | Admit: 2020-07-02 | Discharge: 2020-07-02 | Disposition: A | Discharge status: Home / Self Care | Payer: Medicare Other | Source: Ambulatory Visit | Attending: Nurse Practitioner | Admitting: Nurse Practitioner

## 2020-07-02 ENCOUNTER — Ambulatory Visit (HOSPITAL_COMMUNITY): Payer: Medicare Other

## 2020-07-02 DIAGNOSIS — N649 Disorder of breast, unspecified: Secondary | ICD-10-CM | POA: Insufficient documentation

## 2020-07-02 DIAGNOSIS — N6489 Other specified disorders of breast: Secondary | ICD-10-CM | POA: Diagnosis not present

## 2020-07-02 DIAGNOSIS — N63 Unspecified lump in unspecified breast: Secondary | ICD-10-CM | POA: Insufficient documentation

## 2020-07-02 DIAGNOSIS — R928 Other abnormal and inconclusive findings on diagnostic imaging of breast: Secondary | ICD-10-CM | POA: Diagnosis not present

## 2020-07-07 ENCOUNTER — Telehealth: Payer: Self-pay

## 2020-07-07 NOTE — Telephone Encounter (Signed)
Spouse says that chlordiazePOXIDE (LIBRIUM) 10 MG capsule is not available. Can something else be called in? [facility]

## 2020-07-07 NOTE — Telephone Encounter (Signed)
Please advise 

## 2020-07-08 NOTE — Telephone Encounter (Signed)
Spoke with Kimberly Roth at the drug store- states that they have no doses of libruim. They are all on back order and he does not know when or if he will get any in stock.

## 2020-07-08 NOTE — Telephone Encounter (Signed)
Please find out what doses of librium are available- we had this trouble last time

## 2020-07-08 NOTE — Telephone Encounter (Signed)
Patient is okay with changing medication or increasing Wellbutrin if you think that will help.  Her main concern is that she does not want to take any medication that will cause weight gain.  Please advise.

## 2020-07-08 NOTE — Telephone Encounter (Signed)
Please call patient and see what she wants to do- she has been on this along time

## 2020-07-09 MED ORDER — BUPROPION HCL ER (XL) 300 MG PO TB24: 300.0000 mg | ORAL_TABLET | Freq: Every day | ORAL | 1 refills | Status: DC

## 2020-07-09 NOTE — Telephone Encounter (Signed)
Spoke to pt's husband and he is aware of increase in dosage of medication and states the pt has been off Librium for 2 months now so not concerned about withdrawals.

## 2020-07-09 NOTE — Telephone Encounter (Signed)
In order to prevent  weight gain, we will just have to increase welllbutirn. I will send in new prescription. My only concern is may have withdrawals from stopping librium.

## 2020-07-27 ENCOUNTER — Ambulatory Visit: Payer: Medicare Other | Admitting: Nurse Practitioner

## 2020-07-28 ENCOUNTER — Encounter: Payer: Self-pay | Admitting: Nurse Practitioner

## 2020-07-28 ENCOUNTER — Ambulatory Visit (INDEPENDENT_AMBULATORY_CARE_PROVIDER_SITE_OTHER): Payer: Medicare Other | Admitting: Nurse Practitioner

## 2020-07-28 DIAGNOSIS — J029 Acute pharyngitis, unspecified: Secondary | ICD-10-CM

## 2020-07-28 DIAGNOSIS — H9203 Otalgia, bilateral: Secondary | ICD-10-CM

## 2020-07-28 MED ORDER — AMOXICILLIN 875 MG PO TABS: 875.0000 mg | ORAL_TABLET | Freq: Two times a day (BID) | ORAL | 0 refills | Status: DC

## 2020-07-28 NOTE — Progress Notes (Signed)
   Virtual Visit via telephone Note Due to COVID-19 pandemic this visit was conducted virtually. This visit type was conducted due to national recommendations for restrictions regarding the COVID-19 Pandemic (e.g. social distancing, sheltering in place) in an effort to limit this patient's exposure and mitigate transmission in our community. All issues noted in this document were discussed and addressed.  A physical exam was not performed with this format.  I connected with Kimberly Roth on 07/28/20 at 8:50 by telephone and verified that I am speaking with the correct person using two identifiers. Kimberly Roth is currently located at home and her husband is currently with her during visit. The provider, [name] Daphine Deutscher, FNP is located in their office at time of visit.  I discussed the limitations, risks, security and privacy concerns of performing an evaluation and management service by telephone and the availability of in person appointments. I also discussed with the patient that there may be a patient responsible charge related to this service. The patient expressed understanding and agreed to proceed.   History and Present Illness:  Patient husband cause is stating that patient has had a sore throat  And ear pain for greated then 5 days. She has no voice at all. Has some dizziness when she stands. She has a cough but not SOB or fever.    Review of Systems  Constitutional: Negative for chills and fever.  HENT: Positive for congestion and sore throat. Negative for ear discharge, ear pain and sinus pain.   Respiratory: Positive for cough. Negative for sputum production.   Neurological: Positive for dizziness and headaches (slight).  Psychiatric/Behavioral: Negative.   All other systems reviewed and are negative.    Observations/Objective: Could not speak with patient because she has no voice   Assessment and Plan: Kimberly Roth in today with chief complaint of Sore  Throat   1. Pharyngitis, unspecified etiology 2. Otalgia of both ears Force fluids Motrin or tylenol OTC OTC decongestant Throat lozenges if help New toothbrush in 3 days Meds ordered this encounter  Medications  . amoxicillin (AMOXIL) 875 MG tablet    Sig: Take 1 tablet (875 mg total) by mouth 2 (two) times daily. 1 po BID    Dispense:  20 tablet    Refill:  0    Order Specific Question:   Supervising Provider    Answer:   Arville Care A [id number]   * told patient to be aware that this is more likely viral and antibiotic may not help. Husband insisted on antibiotic.      Follow Up Instructions: prn    I discussed the assessment and treatment plan with the patient. The patient was provided an opportunity to ask questions and all were answered. The patient agreed with the plan and demonstrated an understanding of the instructions.   The patient was advised to call back or seek an in-person evaluation if the symptoms worsen or if the condition fails to improve as anticipated.  The above assessment and management plan was discussed with the patient. The patient verbalized understanding of and has agreed to the management plan. Patient is aware to call the clinic if symptoms persist or worsen. Patient is aware when to return to the clinic for a follow-up visit. Patient educated on when it is appropriate to go to the emergency department.   Time call ended:  9:04  I provided 14 minutes of non-face-to-face time during this encounter.    [name] Daphine Deutscher, FNP

## 2020-08-09 ENCOUNTER — Other Ambulatory Visit: Payer: Self-pay | Admitting: Nurse Practitioner

## 2020-08-09 NOTE — Telephone Encounter (Signed)
  Prescription Request  08/09/2020  What is the name of the medication or equipment? Pt had televisit a couple weeks ago and mmm put her on antibiotics. She is a little better but still has sorethroat,headache, cough, fever.  Have you contacted your pharmacy to request a refill? (if applicable) no  Which pharmacy would you like this sent to? The drug store   Patient notified that their request is being sent to the clinical staff for review and that they should receive a response within 2 business days.

## 2020-08-10 NOTE — Telephone Encounter (Signed)
Aware refill sent to pharmacy ?

## 2020-09-13 ENCOUNTER — Ambulatory Visit (INDEPENDENT_AMBULATORY_CARE_PROVIDER_SITE_OTHER): Payer: Medicare Other | Admitting: Nurse Practitioner

## 2020-09-13 ENCOUNTER — Other Ambulatory Visit: Payer: Self-pay | Admitting: Nurse Practitioner

## 2020-09-13 ENCOUNTER — Other Ambulatory Visit: Payer: Self-pay

## 2020-09-13 ENCOUNTER — Encounter: Payer: Self-pay | Admitting: Nurse Practitioner

## 2020-09-13 VITALS — BP 142/78 | HR 81 | Temp 98.3°F | Resp 20 | Ht 64.0 in | Wt 125.0 lb

## 2020-09-13 DIAGNOSIS — E034 Atrophy of thyroid (acquired): Secondary | ICD-10-CM

## 2020-09-13 DIAGNOSIS — F411 Generalized anxiety disorder: Secondary | ICD-10-CM | POA: Diagnosis not present

## 2020-09-13 DIAGNOSIS — M542 Cervicalgia: Secondary | ICD-10-CM

## 2020-09-13 DIAGNOSIS — M329 Systemic lupus erythematosus, unspecified: Secondary | ICD-10-CM | POA: Diagnosis not present

## 2020-09-13 DIAGNOSIS — F3342 Major depressive disorder, recurrent, in full remission: Secondary | ICD-10-CM | POA: Diagnosis not present

## 2020-09-13 DIAGNOSIS — E7800 Pure hypercholesterolemia, unspecified: Secondary | ICD-10-CM

## 2020-09-13 MED ORDER — CLONAZEPAM 0.5 MG PO TABS: 0.5000 mg | ORAL_TABLET | Freq: Two times a day (BID) | ORAL | 1 refills | Status: DC | PRN

## 2020-09-13 MED ORDER — ATORVASTATIN CALCIUM 40 MG PO TABS: 40.0000 mg | ORAL_TABLET | Freq: Every day | ORAL | 1 refills | Status: AC

## 2020-09-13 MED ORDER — LEVOTHYROXINE SODIUM 75 MCG PO TABS: 75.0000 ug | ORAL_TABLET | Freq: Every day | ORAL | 1 refills | Status: AC

## 2020-09-13 MED ORDER — DICLOFENAC SODIUM 75 MG PO TBEC: 75.0000 mg | DELAYED_RELEASE_TABLET | Freq: Two times a day (BID) | ORAL | 0 refills | Status: AC

## 2020-09-13 NOTE — Progress Notes (Signed)
Subjective:    Patient ID: Kimberly Roth, female    DOB: [birth year], 75 y.o.   MRN: [id number]   Chief Complaint: Neck Pain (Not sleeping/Eyes swelling/Wants to restart librium)    HPI:  1. Pure hypercholesterolemia Does try to watch diet but does no dedicated exercise right now. She recently started lipitor. The 10-year ASCVD risk score [name] DC [name]., et al., 2013) is: 20.3%   Values used to calculate the score:     Age: 62 years     Sex: Female     Is Non-Hispanic African American: No     Diabetic: No     Tobacco smoker: No     Systolic Blood Pressure: 633 mmHg     Is BP treated: No     HDL Cholesterol: 81 mg/dL     Total Cholesterol: 250 mg/dL  Lab Results  Component Value Date   CHOL 250 (H) 06/18/2020   HDL 81 06/18/2020   LDLCALC 152 (H) 06/18/2020   TRIG 98 06/18/2020   CHOLHDL 3.1 06/18/2020     2. Hypothyroidism due to acquired atrophy of thyroid Is on levothyroxin 87mcg daily. Is not having any problems that she is aware of. Lab Results  Component Value Date   TSH 0.015 (L) 06/18/2020     3. Recurrent major depressive disorder, in full remission ([place]) Is on wellbutrin now and is doing well.  Depression screen [facility] 2/9 09/13/2020 06/18/2020 06/18/2020  Decreased Interest 0 0 0  Down, Depressed, Hopeless 1 0 0  PHQ - 2 Score 1 0 0  Altered sleeping - 1 -  Tired, decreased energy - 0 -  Change in appetite - 0 -  Feeling bad or failure about yourself  - 0 -  Trouble concentrating - 0 -  Moving slowly or fidgety/restless - 0 -  Suicidal thoughts - 0 -  PHQ-9 Score - 1 -  Difficult doing work/chores - Not difficult at all -  Some recent data might be hidden     4. GAD (generalized anxiety disorder) Was on librium but we were having trouble getting it. She stopped taking it about 6 months ago. Says she stays anxious and cannot sleep.   GAD 7 : Generalized Anxiety Score 09/13/2020 12/02/2019 05/02/2019 01/10/2018  Nervous, Anxious, on Edge 1 2 3 2    Control/stop worrying 1 2 3 3   Worry too much - different things 1 2 3 3   Trouble relaxing 1 1 3 3   Restless 1 1 3 2   Easily annoyed or irritable 0 0 3 0  Afraid - awful might happen 2 2 3 2   Total GAD 7 Score 7 10 21 15   Anxiety Difficulty Not difficult at all Somewhat difficult - Somewhat difficult     5. Systemic lupus erythematosus, unspecified SLE type, unspecified organ involvement status ([place]) SHe sees a specialist for her lupus.    Outpatient Encounter Medications as of 09/13/2020  Medication Sig  . acetaminophen (TYLENOL) 325 MG tablet Take 650 mg by mouth every 4 (four) hours as needed.  . Ascorbic Acid (VITAMIN C) 1000 MG tablet Take 1,000 mg by mouth 2 (two) times daily.  Marland Kitchen aspirin 325 MG EC tablet Take 325 mg by mouth 2 (two) times daily.  Marland Kitchen atorvastatin (LIPITOR) 40 MG tablet Take 1 tablet (40 mg total) by mouth daily.  Marland Kitchen BIOTIN PO Take 1 tablet by mouth 2 (two) times daily.  Marland Kitchen buPROPion (WELLBUTRIN XL) 300 MG 24 hr tablet Take 1 tablet (  300 mg total) by mouth daily.  . calcium-vitamin D (OSCAL WITH D) 250-125 MG-UNIT per tablet Take 1 tablet by mouth 2 (two) times daily.  . cholecalciferol (VITAMIN D) 1000 UNITS tablet Take 1,000 Units by mouth 2 (two) times daily.  Marland Kitchen CRANBERRY EXTRACT PO Take 1 tablet by mouth 2 (two) times daily.  Marland Kitchen levothyroxine (SYNTHROID) 88 MCG tablet Take 2 tablets (176 mcg total) by mouth daily.  . Multiple Vitamins-Minerals (MULTIVITAMINS THER. W/MINERALS) TABS tablet Take 1 tablet by mouth 2 (two) times daily.  . multivitamin-lutein (OCUVITE-LUTEIN) CAPS capsule Take 1 capsule by mouth 2 (two) times daily.  . Omega-3 Fatty Acids (OMEGA 3 PO) Take 1 capsule by mouth 2 (two) times daily.  . Potassium Gluconate 595 MG CAPS Take 1 capsule by mouth daily.  . Thiamine HCl (VITAMIN B-1) 250 MG tablet Take 250 mg by mouth 2 (two) times daily.  . chlordiazePOXIDE (LIBRIUM) 10 MG capsule Take 2 capsules (20 mg total) by mouth 4 (four) times daily.  (Patient not taking: Reported on 09/13/2020)  . diclofenac (VOLTAREN) 75 MG EC tablet Take 1 tablet (75 mg total) by mouth 2 (two) times daily. With food (Patient not taking: No sig reported)  . diphenhydrAMINE (BENADRYL) 25 MG tablet Take 50 mg by mouth at bedtime as needed for sleep. (Patient not taking: Reported on 09/13/2020)  . meclizine (ANTIVERT) 25 MG tablet Take 1 tablet (25 mg total) by mouth 3 (three) times daily as needed for dizziness. (Patient not taking: Reported on 09/13/2020)    Past Surgical History:  Procedure Laterality Date  . ABDOMINAL HYSTERECTOMY  1976   no oopherectomy  . APPENDECTOMY    . BACK SURGERY    . BREAST IMPLANT REMOVAL Bilateral    Silicone allergy  . BREAST SURGERY Bilateral 1991   Mastectomy  . CATARACT EXTRACTION W/PHACO Right 10/30/2013   Procedure: CATARACT EXTRACTION PHACO AND INTRAOCULAR LENS PLACEMENT (IOC);  Surgeon: Tonny Branch, MD;  Location: [facility];  Service: Ophthalmology;  Laterality: Right;  CDE:10.13  . CATARACT EXTRACTION W/PHACO Left 11/10/2013   Procedure: CATARACT EXTRACTION PHACO AND INTRAOCULAR LENS PLACEMENT (IOC);  Surgeon: Tonny Branch, MD;  Location: [facility];  Service: Ophthalmology;  Laterality: Left;  CDE 9.71  . DILATION AND CURETTAGE OF UTERUS    . EYE SURGERY Bilateral 2015   Cataract - Dr Geoffry Paradise in [place]  . JOINT REPLACEMENT Left 2005   Knee x 2  . MASTECTOMY Bilateral   . PLACEMENT OF BREAST IMPLANTS Bilateral    Secondary to cancer  . RECONSTRUCTION / CORRECTION OF NIPPLE / AEROLA Bilateral   . TONSILLECTOMY    . WISDOM TOOTH EXTRACTION Bilateral    Extraction x 4  . WRIST SURGERY Right 20's   patient not exactly sure of reason    Family History  Problem Relation Age of Onset  . Heart failure Father   . Heart disease Father   . Heart attack Father 4  . Mental illness Mother   . Mental illness Sister        PTSD  . Osteoporosis Paternal Grandmother     New complaints: Her husband is very ill. She worries  about him all the time. She thinks this is why she does  not sleep. She has tried tylenol PM, and brandy which did not help with her sleep.  Social history: Lives with husband who is very ill.  Controlled substance contract: 09/13/20    Review of Systems  Constitutional: Negative for diaphoresis.  Eyes: Negative for pain.  Respiratory: Negative for shortness of breath.   Cardiovascular: Negative for chest pain, palpitations and leg swelling.  Gastrointestinal: Negative for abdominal pain.  Endocrine: Negative for polydipsia.  Skin: Negative for rash.  Neurological: Negative for dizziness, weakness and headaches.  Hematological: Does not bruise/bleed easily.  Psychiatric/Behavioral: Positive for sleep disturbance. The patient is nervous/anxious.   All other systems reviewed and are negative.      Objective:   Physical Exam Vitals and nursing note reviewed.  Constitutional:      General: She is not in acute distress.    Appearance: Normal appearance. She is well-developed and well-nourished.  HENT:     Head: Normocephalic.     Nose: Nose normal.     Mouth/Throat:     Mouth: Oropharynx is clear and moist.  Eyes:     Extraocular Movements: EOM normal.     Pupils: Pupils are equal, round, and reactive to light.  Neck:     Vascular: No carotid bruit or JVD.  Cardiovascular:     Rate and Rhythm: Normal rate and regular rhythm.     Pulses: Intact distal pulses.     Heart sounds: Normal heart sounds.  Pulmonary:     Effort: Pulmonary effort is normal. No respiratory distress.     Breath sounds: Normal breath sounds. No wheezing or rales.  Chest:     Chest wall: No tenderness.  Abdominal:     General: Bowel sounds are normal. There is no distension or abdominal bruit. Aorta is normal.     Palpations: Abdomen is soft. There is no hepatomegaly, splenomegaly, mass or pulsatile mass.     Tenderness: There is no abdominal tenderness.  Musculoskeletal:        General: No edema.  Normal range of motion.     Cervical back: Normal range of motion and neck supple.  Lymphadenopathy:     Cervical: No cervical adenopathy.  Skin:    General: Skin is warm and dry.  Neurological:     Mental Status: She is alert and oriented to person, place, and time.     Deep Tendon Reflexes: Reflexes are normal and symmetric.  Psychiatric:        Mood and Affect: Mood and affect normal.        Behavior: Behavior normal.        Thought Content: Thought content normal.        Judgment: Judgment normal.     BP (!) 142/78   Pulse 81   Temp 98.3 F (36.8 C) (Temporal)   Resp 20   Ht 5\' 4"  (1.626 m)   Wt 125 lb (56.7 kg)   BMI 21.46 kg/m    EKG- NSR with PVC-[name] Done, FNP      Assessment & Plan:  TYKISHA Roth comes in today with chief complaint of Neck Pain (Not sleeping/Eyes swelling/Wants to restart librium)   Diagnosis and orders addressed:  1. Pure hypercholesterolemia Low fat diet - atorvastatin (LIPITOR) 40 MG tablet; Take 1 tablet (40 mg total) by mouth daily.  Dispense: 90 tablet; Refill: 1 - EKG 12-Lead - CBC with Differential/Platelet - CMP14+EGFR - Lipid panel  2. Hypothyroidism due to acquired atrophy of thyroid Decreased levothyroxin to 75MCG while in office today - levothyroxine (SYNTHROID) 75 MCG tablet; Take 1 tablet (75 mcg total) by mouth daily.  Dispense: 90 tablet; Refill: 1 - Thyroid Panel With TSH  3. Recurrent major depressive disorder, in full remission ([place]) continue wellbutrin  as prescribed  4. GAD (generalized anxiety disorder) Added klonopin Contacted pharmacy and librium is no longer available. - clonazePAM (KLONOPIN) 0.5 MG tablet; Take 1 tablet (0.5 mg total) by mouth 2 (two) times daily as needed for anxiety.  Dispense: 20 tablet; Refill: 1  5. Systemic lupus erythematosus, unspecified SLE type, unspecified organ involvement status ([place]) Keep follow up with specialist  6. Neck pain Moist heat - diclofenac  (VOLTAREN) 75 MG EC tablet; Take 1 tablet (75 mg total) by mouth 2 (two) times daily. With food  Dispense: 60 tablet; Refill: 0  Labs pending Health Maintenance reviewed Diet and exercise encouraged  Follow up plan: 6 months   [name] Done, FNP

## 2020-09-13 NOTE — Patient Instructions (Signed)

## 2020-09-14 LAB — CBC WITH DIFFERENTIAL/PLATELET
Basophils Absolute: 0 10*3/uL (ref 0.0–0.2)
Basos: 1 %
EOS (ABSOLUTE): 0.2 10*3/uL (ref 0.0–0.4)
Eos: 4 %
Hematocrit: 37.1 % (ref 34.0–46.6)
Hemoglobin: 13 g/dL (ref 11.1–15.9)
Immature Grans (Abs): 0 10*3/uL (ref 0.0–0.1)
Immature Granulocytes: 0 %
Lymphocytes Absolute: 1.5 10*3/uL (ref 0.7–3.1)
Lymphs: 28 %
MCH: 34 pg — ABNORMAL HIGH (ref 26.6–33.0)
MCHC: 35 g/dL (ref 31.5–35.7)
MCV: 97 fL (ref 79–97)
Monocytes Absolute: 0.5 10*3/uL (ref 0.1–0.9)
Monocytes: 9 %
Neutrophils Absolute: 3.1 10*3/uL (ref 1.4–7.0)
Neutrophils: 58 %
Platelets: 262 10*3/uL (ref 150–450)
RBC: 3.82 x10E6/uL (ref 3.77–5.28)
RDW: 11.2 % — ABNORMAL LOW (ref 11.7–15.4)
WBC: 5.4 10*3/uL (ref 3.4–10.8)

## 2020-09-14 LAB — LIPID PANEL
Chol/HDL Ratio: 3.2 ratio (ref 0.0–4.4)
Cholesterol, Total: 275 mg/dL — ABNORMAL HIGH (ref 100–199)
HDL: 87 mg/dL (ref >39)
LDL Chol Calc (NIH): 176 mg/dL — ABNORMAL HIGH (ref 0–99)
Triglycerides: 74 mg/dL (ref 0–149)
VLDL Cholesterol Cal: 12 mg/dL (ref 5–40)

## 2020-09-14 LAB — THYROID PANEL WITH TSH
Free Thyroxine Index: 3.6 (ref 1.2–4.9)
T3 Uptake Ratio: 33 % (ref 24–39)
T4, Total: 10.9 ug/dL (ref 4.5–12.0)
TSH: 0.021 u[IU]/mL — ABNORMAL LOW (ref 0.450–4.500)

## 2020-09-14 LAB — CMP14+EGFR
ALT: 16 IU/L (ref 0–32)
AST: 15 IU/L (ref 0–40)
Albumin/Globulin Ratio: 2.4 — ABNORMAL HIGH (ref 1.2–2.2)
Albumin: 4.8 g/dL — ABNORMAL HIGH (ref 3.7–4.7)
Alkaline Phosphatase: 79 IU/L (ref 44–121)
BUN/Creatinine Ratio: 9 — ABNORMAL LOW (ref 12–28)
BUN: 6 mg/dL — ABNORMAL LOW (ref 8–27)
Bilirubin Total: 0.4 mg/dL (ref 0.0–1.2)
CO2: 23 mmol/L (ref 20–29)
Calcium: 10.1 mg/dL (ref 8.7–10.3)
Chloride: 96 mmol/L (ref 96–106)
Creatinine, Ser: 0.64 mg/dL (ref 0.57–1.00)
GFR calc Af Amer: 102 mL/min/{1.73_m2} (ref >59)
GFR calc non Af Amer: 89 mL/min/{1.73_m2} (ref >59)
Globulin, Total: 2 g/dL (ref 1.5–4.5)
Glucose: 100 mg/dL — ABNORMAL HIGH (ref 65–99)
Potassium: 4 mmol/L (ref 3.5–5.2)
Sodium: 134 mmol/L (ref 134–144)
Total Protein: 6.8 g/dL (ref 6.0–8.5)

## 2020-09-22 ENCOUNTER — Telehealth: Payer: Self-pay

## 2020-09-22 ENCOUNTER — Other Ambulatory Visit: Payer: Self-pay | Admitting: Nurse Practitioner

## 2020-09-22 DIAGNOSIS — F411 Generalized anxiety disorder: Secondary | ICD-10-CM

## 2020-09-22 NOTE — Telephone Encounter (Signed)
Please review and advise.

## 2020-09-23 ENCOUNTER — Other Ambulatory Visit: Payer: Self-pay | Admitting: Nurse Practitioner

## 2020-09-23 MED ORDER — BELSOMRA 10 MG PO TABS: 1.0000 | ORAL_TABLET | Freq: Every day | ORAL | 1 refills | Status: AC

## 2020-09-23 NOTE — Telephone Encounter (Signed)
The only things I have to offer is Belsomra resperidol

## 2020-09-24 MED ORDER — RISPERIDONE 2 MG PO TABS: 2.0000 mg | ORAL_TABLET | Freq: Every day | ORAL | 1 refills | Status: DC

## 2020-09-24 NOTE — Telephone Encounter (Signed)
Pt aware rx sent into pharmacy and she has already picked it up.

## 2020-09-24 NOTE — Telephone Encounter (Signed)
Risperadol prescription sent to pharmacy

## 2020-09-24 NOTE — Telephone Encounter (Signed)
Spoke with pt and she says the Belsomra is going to cost her $400 and she can't afford that. She would like you to send in the Resperidol to see how much that would be.

## 2020-10-03 ENCOUNTER — Other Ambulatory Visit: Payer: Self-pay | Admitting: Nurse Practitioner

## 2020-11-02 ENCOUNTER — Emergency Department (HOSPITAL_COMMUNITY): Payer: Medicare Other

## 2020-11-02 ENCOUNTER — Inpatient Hospital Stay (HOSPITAL_COMMUNITY)
Admission: EM | Admit: 2020-11-02 | Discharge: 2020-11-05 | DRG: 605 | Disposition: A | Discharge status: Home Health | Payer: Medicare Other | Attending: Internal Medicine | Admitting: Internal Medicine

## 2020-11-02 ENCOUNTER — Other Ambulatory Visit: Payer: Self-pay

## 2020-11-02 DIAGNOSIS — Z8249 Family history of ischemic heart disease and other diseases of the circulatory system: Secondary | ICD-10-CM

## 2020-11-02 DIAGNOSIS — I10 Essential (primary) hypertension: Secondary | ICD-10-CM | POA: Diagnosis not present

## 2020-11-02 DIAGNOSIS — Z8542 Personal history of malignant neoplasm of other parts of uterus: Secondary | ICD-10-CM

## 2020-11-02 DIAGNOSIS — W19XXXA Unspecified fall, initial encounter: Secondary | ICD-10-CM

## 2020-11-02 DIAGNOSIS — Z7982 Long term (current) use of aspirin: Secondary | ICD-10-CM

## 2020-11-02 DIAGNOSIS — S7002XA Contusion of left hip, initial encounter: Secondary | ICD-10-CM | POA: Diagnosis present

## 2020-11-02 DIAGNOSIS — D539 Nutritional anemia, unspecified: Secondary | ICD-10-CM | POA: Diagnosis present

## 2020-11-02 DIAGNOSIS — Z9841 Cataract extraction status, right eye: Secondary | ICD-10-CM

## 2020-11-02 DIAGNOSIS — Y92009 Unspecified place in unspecified non-institutional (private) residence as the place of occurrence of the external cause: Secondary | ICD-10-CM

## 2020-11-02 DIAGNOSIS — R52 Pain, unspecified: Secondary | ICD-10-CM | POA: Diagnosis present

## 2020-11-02 DIAGNOSIS — M329 Systemic lupus erythematosus, unspecified: Secondary | ICD-10-CM | POA: Diagnosis present

## 2020-11-02 DIAGNOSIS — Z885 Allergy status to narcotic agent status: Secondary | ICD-10-CM

## 2020-11-02 DIAGNOSIS — Z96652 Presence of left artificial knee joint: Secondary | ICD-10-CM | POA: Diagnosis present

## 2020-11-02 DIAGNOSIS — W109XXA Fall (on) (from) unspecified stairs and steps, initial encounter: Secondary | ICD-10-CM | POA: Diagnosis present

## 2020-11-02 DIAGNOSIS — Z66 Do not resuscitate: Secondary | ICD-10-CM | POA: Diagnosis present

## 2020-11-02 DIAGNOSIS — Z79890 Hormone replacement therapy (postmenopausal): Secondary | ICD-10-CM

## 2020-11-02 DIAGNOSIS — E039 Hypothyroidism, unspecified: Secondary | ICD-10-CM | POA: Diagnosis present

## 2020-11-02 DIAGNOSIS — T148XXA Other injury of unspecified body region, initial encounter: Secondary | ICD-10-CM

## 2020-11-02 DIAGNOSIS — Z961 Presence of intraocular lens: Secondary | ICD-10-CM | POA: Diagnosis present

## 2020-11-02 DIAGNOSIS — Z91041 Radiographic dye allergy status: Secondary | ICD-10-CM

## 2020-11-02 DIAGNOSIS — I160 Hypertensive urgency: Secondary | ICD-10-CM | POA: Diagnosis present

## 2020-11-02 DIAGNOSIS — Z20822 Contact with and (suspected) exposure to covid-19: Secondary | ICD-10-CM | POA: Diagnosis present

## 2020-11-02 DIAGNOSIS — R609 Edema, unspecified: Secondary | ICD-10-CM | POA: Diagnosis not present

## 2020-11-02 DIAGNOSIS — E785 Hyperlipidemia, unspecified: Secondary | ICD-10-CM | POA: Diagnosis present

## 2020-11-02 DIAGNOSIS — M25552 Pain in left hip: Secondary | ICD-10-CM | POA: Diagnosis not present

## 2020-11-02 DIAGNOSIS — Z9842 Cataract extraction status, left eye: Secondary | ICD-10-CM

## 2020-11-02 DIAGNOSIS — Z79899 Other long term (current) drug therapy: Secondary | ICD-10-CM

## 2020-11-02 DIAGNOSIS — Z91048 Other nonmedicinal substance allergy status: Secondary | ICD-10-CM

## 2020-11-02 DIAGNOSIS — Z8262 Family history of osteoporosis: Secondary | ICD-10-CM

## 2020-11-02 DIAGNOSIS — Z853 Personal history of malignant neoplasm of breast: Secondary | ICD-10-CM

## 2020-11-02 DIAGNOSIS — F4310 Post-traumatic stress disorder, unspecified: Secondary | ICD-10-CM | POA: Diagnosis present

## 2020-11-02 DIAGNOSIS — Z888 Allergy status to other drugs, medicaments and biological substances status: Secondary | ICD-10-CM

## 2020-11-02 DIAGNOSIS — E876 Hypokalemia: Secondary | ICD-10-CM | POA: Diagnosis present

## 2020-11-02 DIAGNOSIS — Z9013 Acquired absence of bilateral breasts and nipples: Secondary | ICD-10-CM

## 2020-11-02 MED ORDER — TRAMADOL HCL 50 MG PO TABS
50.0000 mg | ORAL_TABLET | Freq: Once | ORAL | Status: AC
  Administered 2020-11-02: 50 mg via ORAL
  Filled 2020-11-02: qty 1

## 2020-11-02 NOTE — ED Triage Notes (Signed)
Pt BIB EMS for fall around 2pm, denies hitting head or LOC. Pain to L hip radiating down LLE. Denies blood thinners.

## 2020-11-02 NOTE — ED Provider Notes (Signed)
[facility] EMERGENCY DEPARTMENT Provider Note   CSN: [id number] Arrival date & time: 11/02/20  2315     History Chief Complaint  Patient presents with  . Fall    Kimberly Roth is a 75 y.o. female.  Patient with a history of systemic lupus, history of cancer, anxiety, chronic neck pain here with a fall and hip and back pain.  States she went to see the chiropractor today for her neck pain.  Upon returning home she slipped in the driveway while adjusting her shoe and fell backwards down 3 steps.  Landed on her backside and her left hip and buttock.  Did not hit her head.  Not lose consciousness.  She states she was able to get up on her own without a problem initially.  Several hours later she developed sharp buttock pain rating down her left leg.  She took tramadol without relief.  Denies any weakness, numbness, tingling, bowel or bladder incontinence.  No fever.  No chest pain or shortness of breath.  No blood thinner use.  Denies hitting her head.  States her neck pain is the same as always in the injury from the fall. Did not have pain when she initially fell but happened about 6 hours later after she does taking care of her husband.  The history is provided by the patient and the EMS personnel.  Fall Pertinent negatives include no chest pain, no abdominal pain, no headaches and no shortness of breath.       Past Medical History:  Diagnosis Date  . Anxiety   . Breast cancer ([place])    breast bilateral  . Cataract   . History of shingles   . Hyperlipidemia   . PTSD (post-traumatic stress disorder)    related to childhood trauma  . Systemic lupus ([place])   . Uterine cancer [facility])     Patient Active Problem List   Diagnosis Date Noted  . Neck pain 09/13/2020  . Traumatic partial tear of right biceps tendon 06/29/2016  . Spondylosis of cervical region without myelopathy or radiculopathy 06/29/2016  . Systemic lupus ([place]) 09/02/2014  . Hyperlipidemia 09/02/2014  .  Hypothyroidism 04/09/2013  . Depression 04/09/2013  . GAD (generalized anxiety disorder) 04/09/2013    Past Surgical History:  Procedure Laterality Date  . ABDOMINAL HYSTERECTOMY  1976   no oopherectomy  . APPENDECTOMY    . BACK SURGERY    . BREAST IMPLANT REMOVAL Bilateral    Silicone allergy  . BREAST SURGERY Bilateral 1991   Mastectomy  . CATARACT EXTRACTION W/PHACO Right 10/30/2013   Procedure: CATARACT EXTRACTION PHACO AND INTRAOCULAR LENS PLACEMENT (IOC);  Surgeon: Tonny Branch, MD;  Location: [facility] ORS;  Service: Ophthalmology;  Laterality: Right;  CDE:10.13  . CATARACT EXTRACTION W/PHACO Left 11/10/2013   Procedure: CATARACT EXTRACTION PHACO AND INTRAOCULAR LENS PLACEMENT (IOC);  Surgeon: Tonny Branch, MD;  Location: [facility];  Service: Ophthalmology;  Laterality: Left;  CDE 9.71  . DILATION AND CURETTAGE OF UTERUS    . EYE SURGERY Bilateral 2015   Cataract - Dr Geoffry Paradise in [place]  . JOINT REPLACEMENT Left 2005   Knee x 2  . MASTECTOMY Bilateral   . PLACEMENT OF BREAST IMPLANTS Bilateral    Secondary to cancer  . RECONSTRUCTION / CORRECTION OF NIPPLE / AEROLA Bilateral   . TONSILLECTOMY    . WISDOM TOOTH EXTRACTION Bilateral    Extraction x 4  . WRIST SURGERY Right 20's   patient not exactly sure of reason  OB History   No obstetric history on file.     Family History  Problem Relation Age of Onset  . Heart failure Father   . Heart disease Father   . Heart attack Father 39  . Mental illness Mother   . Mental illness Sister        PTSD  . Osteoporosis Paternal Grandmother     Social History   Tobacco Use  . Smoking status: Never Smoker  . Smokeless tobacco: Never Used  Vaping Use  . Vaping Use: Never used  Substance Use Topics  . Alcohol use: Yes    Alcohol/week: 1.0 standard drink    Types: 1 Glasses of wine per week    Comment: 1 or 2 times per year  . Drug use: No    Home Medications Prior to Admission medications   Medication Sig Start Date End  Date Taking? Authorizing Provider  acetaminophen (TYLENOL) 325 MG tablet Take 650 mg by mouth every 4 (four) hours as needed.    [provider]  Ascorbic Acid (VITAMIN C) 1000 MG tablet Take 1,000 mg by mouth 2 (two) times daily.    [provider]  aspirin 325 MG EC tablet Take 325 mg by mouth 2 (two) times daily.    [provider]  atorvastatin (LIPITOR) 40 MG tablet Take 1 tablet (40 mg total) by mouth daily. 09/13/20   Hassell Done, [name], FNP  BIOTIN PO Take 1 tablet by mouth 2 (two) times daily.    [provider]  buPROPion (WELLBUTRIN XL) 300 MG 24 hr tablet TAKE ONE (1) TABLET EACH DAY 10/04/20   Hassell Done, [name], FNP  calcium-vitamin D (OSCAL WITH D) 250-125 MG-UNIT per tablet Take 1 tablet by mouth 2 (two) times daily.    [provider]  cholecalciferol (VITAMIN D) 1000 UNITS tablet Take 1,000 Units by mouth 2 (two) times daily.    [provider]  CRANBERRY EXTRACT PO Take 1 tablet by mouth 2 (two) times daily.    [provider]  diclofenac (VOLTAREN) 75 MG EC tablet Take 1 tablet (75 mg total) by mouth 2 (two) times daily. With food 09/13/20   [name], FNP  diphenhydrAMINE (BENADRYL) 25 MG tablet Take 50 mg by mouth at bedtime as needed for sleep. Patient not taking: Reported on 09/13/2020    [provider]  levothyroxine (SYNTHROID) 75 MCG tablet Take 1 tablet (75 mcg total) by mouth daily. 09/13/20   Hassell Done [name], FNP  meclizine (ANTIVERT) 25 MG tablet Take 1 tablet (25 mg total) by mouth 3 (three) times daily as needed for dizziness. Patient not taking: Reported on 09/13/2020 05/02/19   [name], FNP  Multiple Vitamins-Minerals (MULTIVITAMINS THER. W/MINERALS) TABS tablet Take 1 tablet by mouth 2 (two) times daily.    [provider]  multivitamin-lutein (OCUVITE-LUTEIN) CAPS capsule Take 1 capsule by mouth 2 (two) times daily.    [provider]  Omega-3  Fatty Acids (OMEGA 3 PO) Take 1 capsule by mouth 2 (two) times daily.    [provider]  Potassium Gluconate 595 MG CAPS Take 1 capsule by mouth daily.    [provider]  risperiDONE (RISPERDAL) 2 MG tablet Take 1 tablet (2 mg total) by mouth at bedtime. 09/24/20   Hassell Done, [name], FNP  Suvorexant (BELSOMRA) 10 MG TABS Take 1 tablet by mouth daily. 09/23/20   Hassell Done, [name], FNP  Thiamine HCl (VITAMIN B-1) 250 MG tablet Take 250 mg by mouth 2 (two) times  daily.    [provider]    Allergies    Codeine, Contrast media [iodinated diagnostic agents], Iodine, Other, Plaquenil [hydroxychloroquine sulfate], Tape, and Morphine and related  Review of Systems   Review of Systems  Constitutional: Negative for activity change, appetite change and fever.  HENT: Negative for congestion and rhinorrhea.   Respiratory: Negative for cough, chest tightness and shortness of breath.   Cardiovascular: Negative for chest pain.  Gastrointestinal: Negative for abdominal pain, nausea and vomiting.  Genitourinary: Negative for dysuria and hematuria.  Musculoskeletal: Positive for arthralgias, myalgias and neck pain.  Skin: Negative for rash and wound.  Neurological: Negative for dizziness, weakness and headaches.   all other systems are negative except as noted in the HPI and PMH.   Physical Exam Updated Vital Signs BP (!) 207/109 (BP Location: Left Arm)   Pulse 81   Temp 98.3 F (36.8 C) (Oral)   Resp 18   Ht 5' 4.5" (1.638 m)   Wt 56.1 kg   SpO2 98%   BMI 20.89 kg/m   Physical Exam Vitals and nursing note reviewed.  Constitutional:      General: She is not in acute distress.    Appearance: She is well-developed.  HENT:     Head: Normocephalic and atraumatic.     Mouth/Throat:     Pharynx: No oropharyngeal exudate.  Eyes:     Conjunctiva/sclera: Conjunctivae normal.     Pupils: Pupils are equal, round, and reactive to light.  Neck:     Comments:  Diffuse paraspinal C-spine tenderness Cardiovascular:     Rate and Rhythm: Normal rate and regular rhythm.     Heart sounds: Normal heart sounds. No murmur heard.   Pulmonary:     Effort: Pulmonary effort is normal. No respiratory distress.     Breath sounds: Normal breath sounds.  Abdominal:     Palpations: Abdomen is soft.     Tenderness: There is no abdominal tenderness. There is no guarding or rebound.  Musculoskeletal:        General: Swelling and tenderness present.     Cervical back: Normal range of motion.     Comments: No midline T or L-spine tenderness.  Hematoma ecchymosis overlying left lateral hip and buttock. Very tender to palpation. Pain with range of motion of hip There is no shortening or external rotation.  She is holding knee in flexed position. Able to wiggle toes. Intact DP and PT pulses bilaterally.  Intact ankle flexion extension at bilaterally. Pelvis is stable  Skin:    General: Skin is warm.  Neurological:     Mental Status: She is alert and oriented to person, place, and time.     Cranial Nerves: No cranial nerve deficit.     Motor: No abnormal muscle tone.     Coordination: Coordination normal.     Comments:  5/5 strength throughout. CN 2-12 intact.Equal grip strength.   Psychiatric:        Behavior: Behavior normal.     ED Results / Procedures / Treatments   Labs (all labs ordered are listed, but only abnormal results are displayed) Labs Reviewed  CBC WITH DIFFERENTIAL/PLATELET - Abnormal; Notable for the following components:      Result Value   RBC 3.57 (*)    HCT 35.7 (*)    All other components within normal limits  BASIC METABOLIC PANEL - Abnormal; Notable for the following components:   Sodium 134 (*)    Potassium 3.4 (*)  Glucose, Bld 131 (*)    All other components within normal limits  RESP PANEL BY RT-PCR (FLU A&B, COVID) ARPGX2  CBC    EKG None  Radiology CT Cervical Spine Wo Contrast  Result Date:  11/03/2020 CLINICAL DATA:  Neck soreness after a fall at 2 p.m. No loss of consciousness. EXAM: CT CERVICAL SPINE WITHOUT CONTRAST TECHNIQUE: Multidetector CT imaging of the cervical spine was performed without intravenous contrast. Multiplanar CT image reconstructions were also generated. COMPARISON:  Cervical spine radiographs 03/19/2020 FINDINGS: Alignment: Slight anterior subluxations at C2-3 and C7-T1 are unchanged since prior study, likely degenerative. Otherwise normal alignment of the cervical spine and posterior elements. Skull base and vertebrae: Skull base appears intact. No vertebral compression deformities. No focal bone lesion or bone destruction. Bone cortex appears intact. Soft tissues and spinal canal: No prevertebral soft tissue swelling. No abnormal soft tissue mass or infiltration. Calcification in the cervical carotid arteries. Disc levels: Degenerative changes throughout with narrowed interspaces and endplate hypertrophic change. Prominent posterior disc osteophyte complexes may cause encroachment upon the anterior thecal sac at C3-4, C4-5, C5-6, and C6-7 levels. Degenerative changes in the facet joints. Upper chest: Lung apices are clear. Other: None. IMPRESSION: 1. No acute displaced fractures identified in the cervical spine. 2. Diffuse degenerative changes throughout the cervical spine. Slight anterior subluxations at C2-3 and C7-T1 are unchanged since prior study, likely degenerative. Electronically Signed   By: Lucienne Capers M.D.   On: 11/03/2020 02:30   CT PELVIS WO CONTRAST  Result Date: 11/03/2020 CLINICAL DATA:  Pain to the left hip and left lower extremity after a fall. Left buttock pain and bruising. EXAM: CT PELVIS WITHOUT CONTRAST TECHNIQUE: Multidetector CT imaging of the pelvis was performed following the standard protocol without intravenous contrast. COMPARISON:  11/17/2019 FINDINGS: Urinary Tract: Bladder is normal. No wall thickening, stone, or filling defect. Bowel:  Visualized colon is diffusely stool-filled. Visualized small bowel are not abnormally distended. Appendix is not identified. Vascular/Lymphatic: Iliofemoral arterial vascular calcifications. Surgical clips in the left groin. Reproductive: Uterus is surgically absent. No abnormal adnexal masses. Other:  No free air or free fluid in the pelvis. Musculoskeletal: Degenerative changes demonstrated in the lower lumbar spine and in both hips. No focal bone lesion or bone destruction. Bone cortex appears intact. No acute displaced fractures identified. No significant effusion or bursal collection. Soft tissue infiltration in the subcutaneous fat over the left hip. Large posterior hematoma demonstrated in the inferior left gluteus maximus muscle posterior to the left hip, measuring 5.9 x 8.1 cm. IMPRESSION: 1. Large hematoma in the inferior left gluteus maximus muscle posterior to the left hip, measuring 5.9 x 8.1 cm. 2. Soft tissue infiltration in the subcutaneous fat over the left hip consistent with contusion. 3. Degenerative changes in the lower lumbar spine and hips. No acute fracture or dislocation. Electronically Signed   By: Lucienne Capers M.D.   On: 11/03/2020 02:19   DG Hip Unilat W or Wo Pelvis 2-3 Views Left  Result Date: 11/03/2020 CLINICAL DATA:  Fall, left hip and leg pain EXAM: DG HIP (WITH OR WITHOUT PELVIS) 2-3V LEFT COMPARISON:  None. FINDINGS: Degenerative changes in the hips bilaterally. No acute bony abnormality. Specifically, no fracture, subluxation, or dislocation. IMPRESSION: No acute bony abnormality. Electronically Signed   By: Rolm Baptise M.D.   On: 11/03/2020 00:42   DG Femur Min 2 Views Left  Result Date: 11/03/2020 CLINICAL DATA:  Fall, left leg pain EXAM: LEFT FEMUR 2 VIEWS COMPARISON:  None. FINDINGS: Changes of left knee replacement. No hardware complicating feature. No acute bony abnormality. Specifically, no fracture, subluxation, or dislocation. IMPRESSION: Left knee  replacement. No acute bony abnormality. Electronically Signed   By: Rolm Baptise M.D.   On: 11/03/2020 00:47    Procedures Procedures   Medications Ordered in ED Medications  traMADol (ULTRAM) tablet 50 mg (has no administration in time range)    ED Course  I have reviewed the triage vital signs and the nursing notes.  Pertinent labs & imaging results that were available during my care of the patient were reviewed by me and considered in my medical decision making (see chart for details).    MDM Rules/Calculators/A&P                         Fall with back and hip injury.  Denies head injury.  Neurovascularly intact. Hematoma ecchymosis to left hip and buttock.  No blood thinner use  Left hip and femur x-ray are negative.  No back pain.  Patient states tramadol ineffective.  She was multiple allergies to pain medication including codeine which causes headaches and nightmares.  States morphine causes "respiratory arrest" in [place].  Discussed that morphine can cause respiratory arrest in anyone if given in a big enough dose. She is never had fentanyl or Dilaudid, Percocet or Vicodin.  She is willing to try small dose of fentanyl.  Narcan is available at the bedside.  Patient able to tolerate fentanyl without difficulty. CT scan shows no fractures but does show left thigh hematoma  Patient would like to try to go home if possible.  Will attempt ambulation after additional pain control.  He states fentanyl is effective.  No issues with reaction to medication. She is willing to try p.o. medication and to attempt pain control for home  After additional pain medication, patient is still unable to stand the side of the bed or ambulate.Marland Kitchen  She will need admission because she is unable to walk or stand.  She is agreeable.  Admission discussed with Dr. Clearence Ped.  Final Clinical Impression(s) / ED Diagnoses Final diagnoses:  None    Rx / DC Orders ED Discharge Orders    None        Rylin Saez, Annie Main, MD 11/03/20 [phone]

## 2020-11-02 NOTE — ED Notes (Addendum)
ED Provider at bedside. Made aware that pt reports she has a Do Not Rescuitate (DNR) form but does not have it with her in-person.

## 2020-11-03 ENCOUNTER — Emergency Department (HOSPITAL_COMMUNITY): Payer: Medicare Other

## 2020-11-03 DIAGNOSIS — W109XXA Fall (on) (from) unspecified stairs and steps, initial encounter: Secondary | ICD-10-CM

## 2020-11-03 DIAGNOSIS — Y92009 Unspecified place in unspecified non-institutional (private) residence as the place of occurrence of the external cause: Secondary | ICD-10-CM | POA: Diagnosis not present

## 2020-11-03 DIAGNOSIS — E785 Hyperlipidemia, unspecified: Secondary | ICD-10-CM | POA: Diagnosis present

## 2020-11-03 DIAGNOSIS — R52 Pain, unspecified: Secondary | ICD-10-CM | POA: Diagnosis not present

## 2020-11-03 DIAGNOSIS — E039 Hypothyroidism, unspecified: Secondary | ICD-10-CM | POA: Diagnosis present

## 2020-11-03 DIAGNOSIS — Z7982 Long term (current) use of aspirin: Secondary | ICD-10-CM | POA: Diagnosis not present

## 2020-11-03 DIAGNOSIS — M7981 Nontraumatic hematoma of soft tissue: Secondary | ICD-10-CM | POA: Diagnosis not present

## 2020-11-03 DIAGNOSIS — Z7401 Bed confinement status: Secondary | ICD-10-CM | POA: Diagnosis not present

## 2020-11-03 DIAGNOSIS — Z79890 Hormone replacement therapy (postmenopausal): Secondary | ICD-10-CM | POA: Diagnosis not present

## 2020-11-03 DIAGNOSIS — Z20822 Contact with and (suspected) exposure to covid-19: Secondary | ICD-10-CM | POA: Diagnosis present

## 2020-11-03 DIAGNOSIS — Z96652 Presence of left artificial knee joint: Secondary | ICD-10-CM | POA: Diagnosis present

## 2020-11-03 DIAGNOSIS — Z8542 Personal history of malignant neoplasm of other parts of uterus: Secondary | ICD-10-CM | POA: Diagnosis not present

## 2020-11-03 DIAGNOSIS — Z9841 Cataract extraction status, right eye: Secondary | ICD-10-CM | POA: Diagnosis not present

## 2020-11-03 DIAGNOSIS — Z961 Presence of intraocular lens: Secondary | ICD-10-CM | POA: Diagnosis present

## 2020-11-03 DIAGNOSIS — M329 Systemic lupus erythematosus, unspecified: Secondary | ICD-10-CM | POA: Diagnosis present

## 2020-11-03 DIAGNOSIS — I160 Hypertensive urgency: Secondary | ICD-10-CM | POA: Diagnosis present

## 2020-11-03 DIAGNOSIS — Z885 Allergy status to narcotic agent status: Secondary | ICD-10-CM | POA: Diagnosis not present

## 2020-11-03 DIAGNOSIS — S3993XA Unspecified injury of pelvis, initial encounter: Secondary | ICD-10-CM | POA: Diagnosis not present

## 2020-11-03 DIAGNOSIS — F4310 Post-traumatic stress disorder, unspecified: Secondary | ICD-10-CM | POA: Diagnosis present

## 2020-11-03 DIAGNOSIS — M160 Bilateral primary osteoarthritis of hip: Secondary | ICD-10-CM | POA: Diagnosis not present

## 2020-11-03 DIAGNOSIS — S7002XD Contusion of left hip, subsequent encounter: Secondary | ICD-10-CM | POA: Diagnosis not present

## 2020-11-03 DIAGNOSIS — Z91041 Radiographic dye allergy status: Secondary | ICD-10-CM | POA: Diagnosis not present

## 2020-11-03 DIAGNOSIS — M79605 Pain in left leg: Secondary | ICD-10-CM | POA: Diagnosis not present

## 2020-11-03 DIAGNOSIS — Z9013 Acquired absence of bilateral breasts and nipples: Secondary | ICD-10-CM | POA: Diagnosis not present

## 2020-11-03 DIAGNOSIS — T148XXA Other injury of unspecified body region, initial encounter: Secondary | ICD-10-CM | POA: Diagnosis not present

## 2020-11-03 DIAGNOSIS — M1612 Unilateral primary osteoarthritis, left hip: Secondary | ICD-10-CM | POA: Diagnosis not present

## 2020-11-03 DIAGNOSIS — Z66 Do not resuscitate: Secondary | ICD-10-CM | POA: Diagnosis present

## 2020-11-03 DIAGNOSIS — I10 Essential (primary) hypertension: Secondary | ICD-10-CM | POA: Diagnosis present

## 2020-11-03 DIAGNOSIS — D539 Nutritional anemia, unspecified: Secondary | ICD-10-CM | POA: Diagnosis present

## 2020-11-03 DIAGNOSIS — Z79899 Other long term (current) drug therapy: Secondary | ICD-10-CM | POA: Diagnosis not present

## 2020-11-03 DIAGNOSIS — Z853 Personal history of malignant neoplasm of breast: Secondary | ICD-10-CM | POA: Diagnosis not present

## 2020-11-03 DIAGNOSIS — Z888 Allergy status to other drugs, medicaments and biological substances status: Secondary | ICD-10-CM | POA: Diagnosis not present

## 2020-11-03 DIAGNOSIS — Z9842 Cataract extraction status, left eye: Secondary | ICD-10-CM | POA: Diagnosis not present

## 2020-11-03 DIAGNOSIS — I6523 Occlusion and stenosis of bilateral carotid arteries: Secondary | ICD-10-CM | POA: Diagnosis not present

## 2020-11-03 DIAGNOSIS — S7002XA Contusion of left hip, initial encounter: Secondary | ICD-10-CM | POA: Diagnosis present

## 2020-11-03 DIAGNOSIS — M47812 Spondylosis without myelopathy or radiculopathy, cervical region: Secondary | ICD-10-CM | POA: Diagnosis not present

## 2020-11-03 DIAGNOSIS — R404 Transient alteration of awareness: Secondary | ICD-10-CM | POA: Diagnosis not present

## 2020-11-03 DIAGNOSIS — S300XXA Contusion of lower back and pelvis, initial encounter: Secondary | ICD-10-CM | POA: Diagnosis not present

## 2020-11-03 DIAGNOSIS — E876 Hypokalemia: Secondary | ICD-10-CM | POA: Diagnosis present

## 2020-11-03 LAB — BASIC METABOLIC PANEL
Anion gap: 10 (ref 5–15)
BUN: 9 mg/dL (ref 8–23)
CO2: 25 mmol/L (ref 22–32)
Calcium: 8.9 mg/dL (ref 8.9–10.3)
Chloride: 99 mmol/L (ref 98–111)
Creatinine, Ser: 0.61 mg/dL (ref 0.44–1.00)
GFR, Estimated: >60 mL/min (ref >60)
Glucose, Bld: 131 mg/dL — ABNORMAL HIGH (ref 70–99)
Potassium: 3.4 mmol/L — ABNORMAL LOW (ref 3.5–5.1)
Sodium: 134 mmol/L — ABNORMAL LOW (ref 135–145)

## 2020-11-03 LAB — CBC WITH DIFFERENTIAL/PLATELET
Abs Immature Granulocytes: 0.02 10*3/uL (ref 0.00–0.07)
Basophils Absolute: 0 10*3/uL (ref 0.0–0.1)
Basophils Relative: 0 %
Eosinophils Absolute: 0.1 10*3/uL (ref 0.0–0.5)
Eosinophils Relative: 2 %
HCT: 35.7 % — ABNORMAL LOW (ref 36.0–46.0)
Hemoglobin: 12 g/dL (ref 12.0–15.0)
Immature Granulocytes: 0 %
Lymphocytes Relative: 18 %
Lymphs Abs: 1.4 10*3/uL (ref 0.7–4.0)
MCH: 33.6 pg (ref 26.0–34.0)
MCHC: 33.6 g/dL (ref 30.0–36.0)
MCV: 100 fL (ref 80.0–100.0)
Monocytes Absolute: 0.5 10*3/uL (ref 0.1–1.0)
Monocytes Relative: 6 %
Neutro Abs: 5.7 10*3/uL (ref 1.7–7.7)
Neutrophils Relative %: 74 %
Platelets: 243 10*3/uL (ref 150–400)
RBC: 3.57 MIL/uL — ABNORMAL LOW (ref 3.87–5.11)
RDW: 12.9 % (ref 11.5–15.5)
WBC: 7.7 10*3/uL (ref 4.0–10.5)
nRBC: 0 % (ref 0.0–0.2)

## 2020-11-03 LAB — CBC
HCT: 35.6 % — ABNORMAL LOW (ref 36.0–46.0)
Hemoglobin: 11.9 g/dL — ABNORMAL LOW (ref 12.0–15.0)
MCH: 33.7 pg (ref 26.0–34.0)
MCHC: 33.4 g/dL (ref 30.0–36.0)
MCV: 100.8 fL — ABNORMAL HIGH (ref 80.0–100.0)
Platelets: 267 10*3/uL (ref 150–400)
RBC: 3.53 MIL/uL — ABNORMAL LOW (ref 3.87–5.11)
RDW: 13.1 % (ref 11.5–15.5)
WBC: 6.9 10*3/uL (ref 4.0–10.5)
nRBC: 0 % (ref 0.0–0.2)

## 2020-11-03 LAB — RESP PANEL BY RT-PCR (FLU A&B, COVID) ARPGX2
Influenza A by PCR: NEGATIVE
Influenza B by PCR: NEGATIVE
SARS Coronavirus 2 by RT PCR: NEGATIVE

## 2020-11-03 MED ORDER — FENTANYL CITRATE (PF) 100 MCG/2ML IJ SOLN
12.5000 ug | INTRAMUSCULAR | Status: DC | PRN
  Administered 2020-11-03: 50 ug via INTRAVENOUS
  Filled 2020-11-03: qty 2

## 2020-11-03 MED ORDER — ONDANSETRON HCL 4 MG/2ML IJ SOLN: 4.0000 mg | Freq: Four times a day (QID) | INTRAMUSCULAR | Status: DC | PRN

## 2020-11-03 MED ORDER — LEVOTHYROXINE SODIUM 50 MCG PO TABS: 75.0000 ug | ORAL_TABLET | Freq: Every day | ORAL | Status: DC

## 2020-11-03 MED ORDER — SUVOREXANT 10 MG PO TABS: 1.0000 | ORAL_TABLET | Freq: Every day | ORAL | Status: DC

## 2020-11-03 MED ORDER — HYDROCODONE-ACETAMINOPHEN 5-325 MG PO TABS
1.0000 | ORAL_TABLET | Freq: Once | ORAL | Status: AC
  Administered 2020-11-03: 1 via ORAL
  Filled 2020-11-03: qty 1

## 2020-11-03 MED ORDER — POTASSIUM CHLORIDE 20 MEQ PO PACK
20.0000 meq | PACK | Freq: Once | ORAL | Status: AC
  Administered 2020-11-03: 20 meq via ORAL
  Filled 2020-11-03: qty 1

## 2020-11-03 MED ORDER — ATORVASTATIN CALCIUM 40 MG PO TABS
40.0000 mg | ORAL_TABLET | Freq: Every day | ORAL | Status: DC
  Administered 2020-11-03 – 2020-11-05 (×3): 40 mg via ORAL
  Filled 2020-11-03 (×3): qty 1

## 2020-11-03 MED ORDER — ACETAMINOPHEN 325 MG PO TABS: 650.0000 mg | ORAL_TABLET | Freq: Four times a day (QID) | ORAL | Status: DC | PRN

## 2020-11-03 MED ORDER — ONDANSETRON HCL 4 MG PO TABS
4.0000 mg | ORAL_TABLET | Freq: Four times a day (QID) | ORAL | Status: DC | PRN
  Administered 2020-11-03: 4 mg via ORAL

## 2020-11-03 MED ORDER — FENTANYL CITRATE (PF) 100 MCG/2ML IJ SOLN
25.0000 ug | Freq: Once | INTRAMUSCULAR | Status: AC
  Administered 2020-11-03: 25 ug via INTRAVENOUS
  Filled 2020-11-03: qty 2

## 2020-11-03 MED ORDER — FENTANYL CITRATE (PF) 100 MCG/2ML IJ SOLN
12.5000 ug | INTRAMUSCULAR | Status: DC | PRN
  Administered 2020-11-03: 25 ug via INTRAVENOUS
  Administered 2020-11-04: 12.5 ug via INTRAVENOUS
  Filled 2020-11-03 (×2): qty 2

## 2020-11-03 MED ORDER — BUPROPION HCL ER (XL) 300 MG PO TB24
300.0000 mg | ORAL_TABLET | Freq: Every day | ORAL | Status: DC
  Administered 2020-11-04: 300 mg via ORAL
  Filled 2020-11-03 (×3): qty 1

## 2020-11-03 MED ORDER — ACETAMINOPHEN 650 MG RE SUPP: 650.0000 mg | Freq: Four times a day (QID) | RECTAL | Status: DC | PRN

## 2020-11-03 MED ORDER — HYDROCODONE-ACETAMINOPHEN 5-325 MG PO TABS
1.0000 | ORAL_TABLET | Freq: Four times a day (QID) | ORAL | Status: DC | PRN
  Administered 2020-11-03 – 2020-11-04 (×3): 1 via ORAL
  Filled 2020-11-03 (×3): qty 1

## 2020-11-03 MED ORDER — NALOXONE HCL 2 MG/2ML IJ SOSY
PREFILLED_SYRINGE | INTRAMUSCULAR | Status: AC
  Filled 2020-11-03: qty 2

## 2020-11-03 MED ORDER — LEVOTHYROXINE SODIUM 75 MCG PO TABS
75.0000 ug | ORAL_TABLET | Freq: Every day | ORAL | Status: DC
  Administered 2020-11-03 – 2020-11-05 (×3): 75 ug via ORAL
  Filled 2020-11-03: qty 2
  Filled 2020-11-03 (×2): qty 1

## 2020-11-03 MED ORDER — RISPERIDONE 1 MG PO TABS
2.0000 mg | ORAL_TABLET | Freq: Every day | ORAL | Status: DC
  Administered 2020-11-03 – 2020-11-04 (×2): 2 mg via ORAL
  Filled 2020-11-03 (×2): qty 2

## 2020-11-03 NOTE — ED Notes (Signed)
Pt returned from CT, VS updated, reports pain has decreased after medication administration, narcan was pulled to bedside per EDP request. No signs of medication reaction noted at this time. Call bell within reach. Bed locked and low, denies further needs at this time. Will continue to monitor.

## 2020-11-03 NOTE — TOC Initial Note (Signed)
Transition of Care [facility]) - Initial/Assessment Note    Patient Details  Name: Kimberly Roth MRN: [id number] Date of Birth: [birth year]  Transition of Care [facility]) CM/SW Contact:    Natasha Bence, LCSW Phone Number: 11/03/2020, 2:53 PM  Clinical Narrative:                 Patient is a 75 year old woman admitted for Hematoma of left hip. CSW provided initial assessment for patient. Patient reported that she currently lives with her husband and was able to complete her ADL's independently before her fall. Patient declined SNF and reported that she would prefer HH. Patient's preferred [facility] agency is [facility] but patient is also agreeable to other [facility] agencies. Patient also requested grief counseling. CSW notified [place] and informed patient that she is able to get a referral from her PCP for out patient therapy for mental health. CSW also noted that a HHSW may be referred for OP therapy for mental health. TOC to follow.   Expected Discharge Plan: [place] Barriers to Discharge: Continued Medical Work up   Patient Goals and CMS Choice Patient states their goals for this hospitalization and ongoing recovery are:: Return home with [facility] CMS Medicare.gov Compare Post Acute Care list provided to:: Patient Choice offered to / list presented to : Patient  Expected Discharge Plan and Services Expected Discharge Plan: [place] In-house Referral: NA Discharge Planning Services: NA Post Acute Care Choice: NA                   DME Arranged: N/A DME Agency: NA                  Prior Living Arrangements/Services   Lives with:: Self Patient language and need for interpreter reviewed:: Yes Do you feel safe going back to the place where you live?: Yes      Need for Family Participation in Patient Care: Yes (Comment) Care giver support system in place?: Yes (comment)   Criminal Activity/Legal Involvement Pertinent to Current  Situation/Hospitalization: No - Comment as needed  Activities of Daily Living Home Assistive Devices/Equipment: None ADL Screening (condition at time of admission) Patient's cognitive ability adequate to safely complete daily activities?: Yes Is the patient deaf or have difficulty hearing?: No Does the patient have difficulty seeing, even when wearing glasses/contacts?: No Does the patient have difficulty concentrating, remembering, or making decisions?: No Patient able to express need for assistance with ADLs?: Yes Does the patient have difficulty dressing or bathing?: No Independently performs ADLs?: Yes (appropriate for developmental age) Does the patient have difficulty walking or climbing stairs?: No Weakness of Legs: None Weakness of Arms/Hands: None  Permission Sought/Granted Permission sought to share information with : Family Supports Permission granted to share information with : Yes, Verbal Permission Granted     Permission granted to share info w AGENCY: Local HH agencies        Emotional Assessment     Affect (typically observed): Accepting,Apprehensive,Adaptable,Appropriate Orientation: : Oriented to Self,Oriented to Situation,Oriented to Place,Oriented to  Time Alcohol / Substance Use: Not Applicable Psych Involvement: No (comment)  Admission diagnosis:  Hematoma [T14.8XXA] Intractable pain [R52] Fall, initial encounter [W19.XXXA] Hematoma of left hip [S70.02XA] Patient Active Problem List   Diagnosis Date Noted  . Intractable pain 11/03/2020  . Hematoma of left hip 11/03/2020  . Neck pain 09/13/2020  . Traumatic partial tear of right biceps tendon 06/29/2016  . Spondylosis of  cervical region without myelopathy or radiculopathy 06/29/2016  . Systemic lupus ([place]) 09/02/2014  . Hyperlipidemia 09/02/2014  . Hypothyroidism 04/09/2013  . Depression 04/09/2013  . GAD (generalized anxiety disorder) 04/09/2013   PCP:  [name], FNP Pharmacy:    [facility], [place] [address] Phone: [phone] Fax: [facility] [address], [place] [address] Phone: [phone] Fax: [phone]     Social Determinants of Health (SDOH) Interventions    Readmission Risk Interventions No flowsheet data found.

## 2020-11-03 NOTE — Evaluation (Signed)
Physical Therapy Evaluation Patient Details Name: Kimberly Roth MRN: [id number] DOB: 1946/03/03 Today's Date: 11/03/2020   History of Present Illness  Kimberly Roth  is a 75 y.o. female, with history of uterine cancer, systemic lupus diagnosed in 62, PTSD, hyperlipidemia, breast cancer, and more presents the ED with a chief complaint of fall.  Patient reports that around 3 PM on 5 April she just returned home from an appointment, walked up 3 stairs, bend over to adjust her shoe, and fell backwards down 3 stairs.  She reports she did not hit her head she did not lose consciousness.  Patient is on aspirin but she is not on any other anticoagulation.  Patient reports that she was able to get up on her own, walk back up to 3 stairs and go in the house.  She promptly threw away that she was because she felt they were a fall risk.  She was able to go about her daily routine, cooking lunch for herself and her husband etc., but then she felt extremely fatigued.  She took a nap and when she woke up her pain was excruciating per her report.  Pain was located in the left hip per her report.  She describes it as sharp, constant, not throbbing.  She attempted repositioning and it did not help.  She was still able to walk to the bathroom and back without assistance.  She tried to distract herself by reading a book.  When she tried to get up after sitting for long period she could not stand up.  Patient then decided it was time to come into the ER.    Clinical Impression  Patient demonstrates slow labored movement for sitting up at bedside requiring Mod assist to move LLE and verbal/tactile cueing for proper hand placement, unsteady labored movement with difficulty advancing LLE during gait training due to pain, tolerated approximately 50% weightbearing on LLE and able to ambulate to doorway and back to bedside before fatiguing.  Patient tolerated sitting up in chair after therapy - RN notified.  Patient will benefit from  continued physical therapy in hospital and recommended venue below to increase strength, balance, endurance for safe ADLs and gait.      Follow Up Recommendations SNF    Equipment Recommendations  None recommended by PT    Recommendations for Other Services       Precautions / Restrictions Precautions Precautions: Fall Restrictions Weight Bearing Restrictions: No      Mobility  Bed Mobility Overal bed mobility: Needs Assistance Bed Mobility: Supine to Sit     Supine to sit: Mod assist     General bed mobility comments: slow labored movement with Mod assist to move LLE and verbal cues for proper hand placement during supine to sitting    Transfers Overall transfer level: Needs assistance Equipment used: Rolling walker (2 wheeled) Transfers: Sit to/from [facility] Sit to Stand: Min assist;Mod assist Stand pivot transfers: Min assist;Mod assist       General transfer comment: increased time, labored movement  Ambulation/Gait Ambulation/Gait assistance: Min assist;Mod assist Gait Distance (Feet): 22 Feet Assistive device: Rolling walker (2 wheeled) Gait Pattern/deviations: Decreased step length - left;Decreased step length - right;Decreased stance time - left;Decreased stride length;Antalgic Gait velocity: decreased   General Gait Details: slow labored cadence with decreased step/stride length with LLE with limited weightbearing due to increased pain, limited due to fatigue  Stairs            Wheelchair Mobility  Modified Rankin (Stroke Patients Only)       Balance Overall balance assessment: Needs assistance Sitting-balance support: Feet supported;No upper extremity supported Sitting balance-Leahy Scale: Fair Sitting balance - Comments: fair/good seated at EOB   Standing balance support: During functional activity;Bilateral upper extremity supported Standing balance-Leahy Scale: Poor Standing balance comment: fair/poor using  RW                             Pertinent Vitals/Pain Pain Assessment: 0-10 Pain Score: 9  Pain Location: left hip Pain Descriptors / Indicators: Sore;Grimacing Pain Intervention(s): Limited activity within patient's tolerance;Monitored during session;Premedicated before session;Repositioned    Home Living Family/patient expects to be discharged to:: Private residence Living Arrangements: Spouse/significant other Available Help at Discharge: Family;Available PRN/intermittently Type of Home: House Home Access: Ramped entrance     Home Layout: One level Home Equipment: Walker - 2 wheels;Wheelchair - [employer];Shower seat      Prior Function Level of Independence: Independent         Comments: Hydrographic surveyor, drives, works as a Occupational hygienist, 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     Journalist, newspaper        Extremity/Trunk Assessment   Upper Extremity Assessment Upper Extremity Assessment: Overall WFL for tasks assessed    Lower Extremity Assessment Lower Extremity Assessment: Generalized weakness;RLE deficits/detail;LLE deficits/detail RLE Deficits / Details: grossly 4+/5 RLE Sensation: WNL RLE Coordination: WNL LLE Deficits / Details: grossly 3+/5 LLE: Unable to fully assess due to pain LLE Sensation: WNL LLE Coordination: WNL    Cervical / Trunk Assessment Cervical / Trunk Assessment: Normal  Communication   Communication: No difficulties  Cognition Arousal/Alertness: Awake/alert Behavior During Therapy: WFL for tasks assessed/performed Overall Cognitive Status: Within Functional Limits for tasks assessed                                        General Comments      Exercises     Assessment/Plan    PT Assessment Patient needs continued PT services  PT Problem List Decreased strength;Decreased activity tolerance;Decreased balance;Decreased mobility       PT Treatment Interventions DME instruction;Gait training;Stair  training;Functional mobility training;Therapeutic activities;Therapeutic exercise;Patient/family education;Balance training    PT Goals (Current goals can be found in the Care Plan section)  Acute Rehab PT Goals Patient Stated Goal: return home after rehab able to take of self and spouse PT Goal Formulation: With patient Time For Goal Achievement: 11/17/20 Potential to Achieve Goals: Good    Frequency Min 3X/week   Barriers to discharge        Co-evaluation               AM-PAC PT "6 Clicks" Mobility  Outcome Measure Help needed turning from your back to your side while in a flat bed without using bedrails?: A Lot Help needed moving from lying on your back to sitting on the side of a flat bed without using bedrails?: A Lot Help needed moving to and from a bed to a chair (including a wheelchair)?: A Lot Help needed standing up from a chair using your arms (e.g., wheelchair or bedside chair)?: A Lot Help needed to walk in hospital room?: A Lot Help needed climbing 3-5 steps with a railing? : A Lot 6 Click Score: 12    End of Session   Activity Tolerance: Patient  tolerated treatment well;Patient limited by fatigue Patient left: in chair;with call bell/phone within reach Nurse Communication: Mobility status PT Visit Diagnosis: Unsteadiness on feet (R26.81);Other abnormalities of gait and mobility (R26.89);Muscle weakness (generalized) (M62.81)    Time: 9390-3009 PT Time Calculation (min) (ACUTE ONLY): 34 min   Charges:   PT Evaluation $PT Eval Moderate Complexity: 1 Mod PT Treatments $Therapeutic Activity: 23-37 mins        2:37 PM, 11/03/20 Lonell Grandchild, MPT Physical Therapist with [facility] [phone] office [phone] mobile phone

## 2020-11-03 NOTE — H&P (Signed)
TRH H&P    Patient Demographics:    Kimberly Roth, is a 75 y.o. female  MRN: [id number]  DOB - Aug 31, 1945  Admit Date - 11/02/2020  Referring MD/NP/PA: Rancour  Outpatient Primary MD for the patient is [name], [place]  Patient coming from: Home  Chief complaint- Fall   HPI:    Kimberly Roth  is a 75 y.o. female, with history of uterine cancer, systemic lupus diagnosed in 18, PTSD, hyperlipidemia, breast cancer, and more presents the ED with a chief complaint of fall.  Patient reports that around 3 PM on 5 April she just returned home from an appointment, walked up 3 stairs, bend over to adjust her shoe, and fell backwards down 3 stairs.  She reports she did not hit her head she did not lose consciousness.  Patient is on aspirin but she is not on any other anticoagulation.  Patient reports that she was able to get up on her own, walk back up to 3 stairs and go in the house.  She promptly threw away that she was because she felt they were a fall risk.  She was able to go about her daily routine, cooking lunch for herself and her husband etc., but then she felt extremely fatigued.  She took a nap and when she woke up her pain was excruciating per her report.  Pain was located in the left hip per her report.  She describes it as sharp, constant, not throbbing.  She attempted repositioning and it did not help.  She was still able to walk to the bathroom and back without assistance.  She tried to distract herself by reading a book.  When she tried to get up after sitting for long period she could not stand up.  Patient then decided it was time to come into the ER.  In the ED Temp 98.3, heart rate 81-89, respiratory rate 18-20, blood pressure 160/90 at the time of my exam, but as high as 207/109 in the ER, satting at 99% White blood cell count 7.7, hemoglobin 12.0 Chemistry panel reveals a hypokalemia at 3.4 CT  cervical spine shows no acute displaced fractures, diffuse degenerative changes, slight anterior C2-C3 and C7-T1 subluxation-unchanged since prior study CT pelvis shows a large hematoma in the inferior left gluteus maximus posterior to the left hip that is approximately 5.9 x 8.1 cm X-ray femur shows left knee replacement no acute bony abnormality X-ray hip shows no acute bony abnormality Patient was given fentanyl 25 mics x2, Norco 1 tab, tramadol, and still unable to ambulate    Review of systems:    In addition to the HPI above,  No Fever-chills, No Headache, No changes with Vision or hearing, No problems swallowing food or Liquids, No Chest pain, Cough or Shortness of Breath, No Abdominal pain, No Nausea or Vomiting, bowel movements are regular, No Blood in stool or Urine, No dysuria, No new skin rashes  No new weakness, tingling, numbness in any extremity, No recent weight gain or loss, No polyuria, polydypsia or polyphagia, No significant  Mental Stressors.  All other systems reviewed and are negative.    Past History of the following :    Past Medical History:  Diagnosis Date  . Anxiety   . Breast cancer ([place])    breast bilateral  . Cataract   . History of shingles   . Hyperlipidemia   . PTSD (post-traumatic stress disorder)    related to childhood trauma  . Systemic lupus ([place])   . Uterine cancer [facility])       Past Surgical History:  Procedure Laterality Date  . ABDOMINAL HYSTERECTOMY  1976   no oopherectomy  . APPENDECTOMY    . BACK SURGERY    . BREAST IMPLANT REMOVAL Bilateral    Silicone allergy  . BREAST SURGERY Bilateral 1991   Mastectomy  . CATARACT EXTRACTION W/PHACO Right 10/30/2013   Procedure: CATARACT EXTRACTION PHACO AND INTRAOCULAR LENS PLACEMENT (IOC);  Surgeon: Tonny Branch, MD;  Location: [facility];  Service: Ophthalmology;  Laterality: Right;  CDE:10.13  . CATARACT EXTRACTION W/PHACO Left 11/10/2013   Procedure: CATARACT EXTRACTION PHACO AND  INTRAOCULAR LENS PLACEMENT (IOC);  Surgeon: Tonny Branch, MD;  Location: [facility];  Service: Ophthalmology;  Laterality: Left;  CDE 9.71  . DILATION AND CURETTAGE OF UTERUS    . EYE SURGERY Bilateral 2015   Cataract - Dr Geoffry Paradise in [place]  . JOINT REPLACEMENT Left 2005   Knee x 2  . MASTECTOMY Bilateral   . PLACEMENT OF BREAST IMPLANTS Bilateral    Secondary to cancer  . RECONSTRUCTION / CORRECTION OF NIPPLE / AEROLA Bilateral   . TONSILLECTOMY    . WISDOM TOOTH EXTRACTION Bilateral    Extraction x 4  . WRIST SURGERY Right 20's   patient not exactly sure of reason      Social History:      Social History   Tobacco Use  . Smoking status: Never Smoker  . Smokeless tobacco: Never Used  Substance Use Topics  . Alcohol use: Yes    Alcohol/week: 1.0 standard drink    Types: 1 Glasses of wine per week    Comment: 1 or 2 times per year       Family History :     Family History  Problem Relation Age of Onset  . Heart failure Father   . Heart disease Father   . Heart attack Father 16  . Mental illness Mother   . Mental illness Sister        PTSD  . Osteoporosis Paternal Grandmother       Home Medications:   Prior to Admission medications   Medication Sig Start Date End Date Taking? Authorizing Provider  acetaminophen (TYLENOL) 325 MG tablet Take 650 mg by mouth every 4 (four) hours as needed.    [provider]  Ascorbic Acid (VITAMIN C) 1000 MG tablet Take 1,000 mg by mouth 2 (two) times daily.    [provider]  aspirin 325 MG EC tablet Take 325 mg by mouth 2 (two) times daily.    [provider]  atorvastatin (LIPITOR) 40 MG tablet Take 1 tablet (40 mg total) by mouth daily. 09/13/20   Hassell Done, [name], FNP  BIOTIN PO Take 1 tablet by mouth 2 (two) times daily.    [provider]  buPROPion (WELLBUTRIN XL) 300 MG 24 hr tablet TAKE ONE (1) TABLET EACH DAY 10/04/20   Hassell Done, [name], FNP  calcium-vitamin D (OSCAL WITH D)  250-125 MG-UNIT per tablet Take 1 tablet by mouth 2 (two) times  daily.    [provider]  cholecalciferol (VITAMIN D) 1000 UNITS tablet Take 1,000 Units by mouth 2 (two) times daily.    [provider]  CRANBERRY EXTRACT PO Take 1 tablet by mouth 2 (two) times daily.    [provider]  diclofenac (VOLTAREN) 75 MG EC tablet Take 1 tablet (75 mg total) by mouth 2 (two) times daily. With food 09/13/20   [name], FNP  diphenhydrAMINE (BENADRYL) 25 MG tablet Take 50 mg by mouth at bedtime as needed for sleep. Patient not taking: Reported on 09/13/2020    [provider]  levothyroxine (SYNTHROID) 75 MCG tablet Take 1 tablet (75 mcg total) by mouth daily. 09/13/20   Hassell Done [name], FNP  meclizine (ANTIVERT) 25 MG tablet Take 1 tablet (25 mg total) by mouth 3 (three) times daily as needed for dizziness. Patient not taking: Reported on 09/13/2020 05/02/19   [name], FNP  Multiple Vitamins-Minerals (MULTIVITAMINS THER. W/MINERALS) TABS tablet Take 1 tablet by mouth 2 (two) times daily.    [provider]  multivitamin-lutein (OCUVITE-LUTEIN) CAPS capsule Take 1 capsule by mouth 2 (two) times daily.    [provider]  Omega-3 Fatty Acids (OMEGA 3 PO) Take 1 capsule by mouth 2 (two) times daily.    [provider]  Potassium Gluconate 595 MG CAPS Take 1 capsule by mouth daily.    [provider]  risperiDONE (RISPERDAL) 2 MG tablet Take 1 tablet (2 mg total) by mouth at bedtime. 09/24/20   Hassell Done, [name], FNP  Suvorexant (BELSOMRA) 10 MG TABS Take 1 tablet by mouth daily. 09/23/20   Hassell Done, [name], FNP  Thiamine HCl (VITAMIN B-1) 250 MG tablet Take 250 mg by mouth 2 (two) times daily.    [provider]     Allergies:     Allergies  Allergen Reactions  . Codeine     Horrible headaches, nightmares   . Contrast Media [Iodinated Diagnostic Agents]     Respiratory arrest.   .  Iodine Other (See Comments)    Respiratory arrest.   . Other Other (See Comments)    "can tolerate paper tape for a short time" "can tolerate paper tape for a short time"   . Plaquenil [Hydroxychloroquine Sulfate]     Headaches/night terrors   . Tape     "can tolerate paper tape for a short time"  . Morphine And Related     Respiratory arrest.      Physical Exam:   Vitals  Blood pressure (!) 161/101, pulse 84, temperature 98.2 F (36.8 C), temperature source Oral, resp. rate 18, height 5' 4.5" (1.638 m), weight 56.1 kg, SpO2 98 %.  1.  General: Patient lying supine in bed with bilateral hips flexed to 45 degrees and knee flexed as well  2. Psychiatric: Mood and behavior normal for situation, patient alert and oriented x3, patient is pleasant and cooperative with exam  3. Neurologic: Face is symmetric, speech and language are normal, moves all 4 extremities voluntarily, equal sensation in lower extremities bilaterally, no acute deficit on limited exam  4. HEENMT:  Head is atraumatic, normocephalic, pupils reactive to light, neck is supple, trachea is midline, mucous membranes are moist  5. Respiratory : Lungs are clear to auscultation bilaterally without wheezes, rales, rhonchi, no clubbing, no cyanosis  6. Cardiovascular : Heart rate is normal, rhythm is regular, no murmurs rubs or gallops, no pitting edema, peripheral pulses palpated  7. Gastrointestinal:  Abdomen soft, nondistended, nontender to  palpation, no palpable masses  8. Skin:  Skin is warm dry and intact without acute lesion on limited exam  9.Musculoskeletal:  Exquisite tenderness with gluteus medius, gluteus maximus to palpation, also exquisitely tender with passive and active range of motion    Data Review:    CBC Recent Labs  Lab 11/03/20 0118  WBC 7.7  HGB 12.0  HCT 35.7*  PLT 243  MCV 100.0  MCH 33.6  MCHC 33.6  RDW 12.9  LYMPHSABS 1.4  MONOABS 0.5  EOSABS 0.1  BASOSABS 0.0    ------------------------------------------------------------------------------------------------------------------  Results for orders placed or performed during the hospital encounter of 11/02/20 (from the past 48 hour(s))  CBC with Differential/Platelet     Status: Abnormal   Collection Time: 11/03/20  1:18 AM  Result Value Ref Range   WBC 7.7 4.0 - 10.5 K/uL   RBC 3.57 (L) 3.87 - 5.11 MIL/uL   Hemoglobin 12.0 12.0 - 15.0 g/dL   HCT 35.7 (L) 36.0 - 46.0 %   MCV 100.0 80.0 - 100.0 fL   MCH 33.6 26.0 - 34.0 pg   MCHC 33.6 30.0 - 36.0 g/dL   RDW 12.9 11.5 - 15.5 %   Platelets 243 150 - 400 K/uL   nRBC 0.0 0.0 - 0.2 %   Neutrophils Relative % 74 %   Neutro Abs 5.7 1.7 - 7.7 K/uL   Lymphocytes Relative 18 %   Lymphs Abs 1.4 0.7 - 4.0 K/uL   Monocytes Relative 6 %   Monocytes Absolute 0.5 0.1 - 1.0 K/uL   Eosinophils Relative 2 %   Eosinophils Absolute 0.1 0.0 - 0.5 K/uL   Basophils Relative 0 %   Basophils Absolute 0.0 0.0 - 0.1 K/uL   Immature Granulocytes 0 %   Abs Immature Granulocytes 0.02 0.00 - 0.07 K/uL    Comment: Performed at [facility], [address], [place], [place] [address]  Basic metabolic panel     Status: Abnormal   Collection Time: 11/03/20  1:18 AM  Result Value Ref Range   Sodium 134 (L) 135 - 145 mmol/L   Potassium 3.4 (L) 3.5 - 5.1 mmol/L   Chloride 99 98 - 111 mmol/L   CO2 25 22 - 32 mmol/L   Glucose, Bld 131 (H) 70 - 99 mg/dL    Comment: Glucose reference range applies only to samples taken after fasting for at least 8 hours.   BUN 9 8 - 23 mg/dL   Creatinine, Ser 0.61 0.44 - 1.00 mg/dL   Calcium 8.9 8.9 - 10.3 mg/dL   GFR, Estimated >60 >60 mL/min    Comment: (NOTE) Calculated using the CKD-EPI Creatinine Equation (2021)    Anion gap 10 5 - 15    Comment: Performed at [facility], [address], [place], [place] [address]    Chemistries  Recent Labs  Lab 11/03/20 0118  NA 134*  K 3.4*  CL 99  CO2 25  GLUCOSE 131*  BUN 9   CREATININE 0.61  CALCIUM 8.9   ------------------------------------------------------------------------------------------------------------------  ------------------------------------------------------------------------------------------------------------------ GFR: Estimated Creatinine Clearance: 55.3 mL/min (by C-G formula based on SCr of 0.61 mg/dL). Liver Function Tests: No results for input(s): AST, ALT, ALKPHOS, BILITOT, PROT, ALBUMIN in the last 168 hours. No results for input(s): LIPASE, AMYLASE in the last 168 hours. No results for input(s): AMMONIA in the last 168 hours. Coagulation Profile: No results for input(s): INR, PROTIME in the last 168 hours. Cardiac Enzymes: No results for input(s): CKTOTAL, CKMB, CKMBINDEX, TROPONINI in  the last 168 hours. BNP (last 3 results) No results for input(s): PROBNP in the last 8760 hours. HbA1C: No results for input(s): HGBA1C in the last 72 hours. CBG: No results for input(s): GLUCAP in the last 168 hours. Lipid Profile: No results for input(s): CHOL, HDL, LDLCALC, TRIG, CHOLHDL, LDLDIRECT in the last 72 hours. Thyroid Function Tests: No results for input(s): TSH, T4TOTAL, FREET4, T3FREE, THYROIDAB in the last 72 hours. Anemia Panel: No results for input(s): VITAMINB12, FOLATE, FERRITIN, TIBC, IRON, RETICCTPCT in the last 72 hours.  --------------------------------------------------------------------------------------------------------------- Urine analysis:    Component Value Date/Time   APPEARANCEUR Clear 01/14/2020 1513   GLUCOSEU Negative 01/14/2020 1513   BILIRUBINUR Negative 01/14/2020 1513   PROTEINUR Negative 01/14/2020 1513   NITRITE Negative 01/14/2020 1513   LEUKOCYTESUR Negative 01/14/2020 1513      Imaging Results:    CT Cervical Spine Wo Contrast  Result Date: 11/03/2020 CLINICAL DATA:  Neck soreness after a fall at 2 p.m. No loss of consciousness. EXAM: CT CERVICAL SPINE WITHOUT CONTRAST TECHNIQUE:  Multidetector CT imaging of the cervical spine was performed without intravenous contrast. Multiplanar CT image reconstructions were also generated. COMPARISON:  Cervical spine radiographs 03/19/2020 FINDINGS: Alignment: Slight anterior subluxations at C2-3 and C7-T1 are unchanged since prior study, likely degenerative. Otherwise normal alignment of the cervical spine and posterior elements. Skull base and vertebrae: Skull base appears intact. No vertebral compression deformities. No focal bone lesion or bone destruction. Bone cortex appears intact. Soft tissues and spinal canal: No prevertebral soft tissue swelling. No abnormal soft tissue mass or infiltration. Calcification in the cervical carotid arteries. Disc levels: Degenerative changes throughout with narrowed interspaces and endplate hypertrophic change. Prominent posterior disc osteophyte complexes may cause encroachment upon the anterior thecal sac at C3-4, C4-5, C5-6, and C6-7 levels. Degenerative changes in the facet joints. Upper chest: Lung apices are clear. Other: None. IMPRESSION: 1. No acute displaced fractures identified in the cervical spine. 2. Diffuse degenerative changes throughout the cervical spine. Slight anterior subluxations at C2-3 and C7-T1 are unchanged since prior study, likely degenerative. Electronically Signed   By: Lucienne Capers M.D.   On: 11/03/2020 02:30   CT PELVIS WO CONTRAST  Result Date: 11/03/2020 CLINICAL DATA:  Pain to the left hip and left lower extremity after a fall. Left buttock pain and bruising. EXAM: CT PELVIS WITHOUT CONTRAST TECHNIQUE: Multidetector CT imaging of the pelvis was performed following the standard protocol without intravenous contrast. COMPARISON:  11/17/2019 FINDINGS: Urinary Tract: Bladder is normal. No wall thickening, stone, or filling defect. Bowel: Visualized colon is diffusely stool-filled. Visualized small bowel are not abnormally distended. Appendix is not identified.  Vascular/Lymphatic: Iliofemoral arterial vascular calcifications. Surgical clips in the left groin. Reproductive: Uterus is surgically absent. No abnormal adnexal masses. Other:  No free air or free fluid in the pelvis. Musculoskeletal: Degenerative changes demonstrated in the lower lumbar spine and in both hips. No focal bone lesion or bone destruction. Bone cortex appears intact. No acute displaced fractures identified. No significant effusion or bursal collection. Soft tissue infiltration in the subcutaneous fat over the left hip. Large posterior hematoma demonstrated in the inferior left gluteus maximus muscle posterior to the left hip, measuring 5.9 x 8.1 cm. IMPRESSION: 1. Large hematoma in the inferior left gluteus maximus muscle posterior to the left hip, measuring 5.9 x 8.1 cm. 2. Soft tissue infiltration in the subcutaneous fat over the left hip consistent with contusion. 3. Degenerative changes in the lower lumbar spine and hips. No acute  fracture or dislocation. Electronically Signed   By: Lucienne Capers M.D.   On: 11/03/2020 02:19   DG Hip Unilat W or Wo Pelvis 2-3 Views Left  Result Date: 11/03/2020 CLINICAL DATA:  Fall, left hip and leg pain EXAM: DG HIP (WITH OR WITHOUT PELVIS) 2-3V LEFT COMPARISON:  None. FINDINGS: Degenerative changes in the hips bilaterally. No acute bony abnormality. Specifically, no fracture, subluxation, or dislocation. IMPRESSION: No acute bony abnormality. Electronically Signed   By: Rolm Baptise M.D.   On: 11/03/2020 00:42   DG Femur Min 2 Views Left  Result Date: 11/03/2020 CLINICAL DATA:  Fall, left leg pain EXAM: LEFT FEMUR 2 VIEWS COMPARISON:  None. FINDINGS: Changes of left knee replacement. No hardware complicating feature. No acute bony abnormality. Specifically, no fracture, subluxation, or dislocation. IMPRESSION: Left knee replacement. No acute bony abnormality. Electronically Signed   By: Rolm Baptise M.D.   On: 11/03/2020 00:47      Assessment & Plan:     Active Problems:   Intractable pain   1. Intractable pain 1. Can't walk or stand, even after multiple doses of fentanyl and norco 2. PT consult 3. Continue as needed fentanyl 4. Continue to monitor 2. Mechanical fall 1. Adjusting shoe, fell backwards down 3 steps 2. No preceding factors 3. Consult PT 3. Hypertensive crisis 1. HTN at presentation 207/109 2. Most likel 2/2 pain 3. Improved with pain medication 4. Continue to monitor 4. Hypokalemia 1. Replace with PO K+ 5. Hematoma 1. 6cm x 8cm hematoma 2. Surface is soft, but very tender 3. Pain out of proportion to exam 4. Consult ortho 6. HLD  1. Continue statin 7. Psych disorder 1. Continue wellbutrin, risperdal and belsomra 8. Hypothyroidism 1. Continue 50mcg synthroid   DVT Prophylaxis-  SCDs  AM Labs Ordered, also please review Full Orders  Family Communication: No family at bedside Code Status:  DNR Admission status: Observation  Time spent in minutes :[place] DO

## 2020-11-03 NOTE — ED Notes (Signed)
Attempted to ambulate pt, pt was unable to get to the side of the bed from pain. EDP notified

## 2020-11-03 NOTE — ED Notes (Signed)
Admitting hospitalist saw pt, made Dr. Clearence Ped aware that pt reports she has a DNR order, Dr aware, signed and held full code order to be changed to DNR order.

## 2020-11-03 NOTE — Plan of Care (Signed)
  Problem: Acute Rehab PT Goals(only PT should resolve) Goal: Pt Will Go Supine/Side To Sit Outcome: Progressing Flowsheets (Taken 11/03/2020 1439) Pt will go Supine/Side to Sit:  with minimal assist  with moderate assist Goal: Patient Will Transfer Sit To/From Stand Outcome: Progressing Flowsheets (Taken 11/03/2020 1439) Patient will transfer sit to/from stand:  with minimal assist  with min guard assist Goal: Pt Will Transfer Bed To Chair/Chair To Bed Outcome: Progressing Flowsheets (Taken 11/03/2020 1439) Pt will Transfer Bed to Chair/Chair to Bed: with min assist Goal: Pt Will Ambulate Outcome: Progressing Flowsheets (Taken 11/03/2020 1439) Pt will Ambulate:  50 feet  with minimal assist  with rolling walker   2:40 PM, 11/03/20 Lonell Grandchild, MPT Physical Therapist with [facility] [phone] office [phone] mobile phone

## 2020-11-03 NOTE — ED Notes (Signed)
Attempted to call report. Nurse unavailable at this time.

## 2020-11-03 NOTE — Progress Notes (Signed)
PROGRESS NOTE   Kimberly Roth  YQI:[id number]    DOB: November 21, 1945    DOA: 11/02/2020  PCP: [name], FNP   I have briefly reviewed patients previous medical records in [facility].  Chief Complaint  Patient presents with  . 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  CT pelvis showed a large left gluteus maximus hematoma along with left hip area contusion.  Admitted for ambulatory dysfunction due to left buttock area hematoma with associated pain following mechanical fall.  Treating supportively with pain management.  Orthopedics consulted.   Assessment & Plan:  Active Problems:   Intractable pain   Ambulatory dysfunction, intractable pain following mechanical fall and a large left gluteal area hematoma: Sustained after mechanical fall at home.  Reportedly fell backwards down 3 steps.  No LOC or head injury. CT C-spine: No acute displaced fractures. CT pelvis: Large hematoma in the inferior left gluteus maximus muscle posterior to the left hip, measuring 5.9 x 8.1 cm.  Soft tissue infiltration in the subcutaneous fat over the left hip consistent with contusion.  No acute fractures or dislocations Left femur x-ray: S/p left knee replacement.  No acute bony abnormality Left hip x-ray: No acute bony abnormality. Neurovascular bundle LLE intact. Treat supportively with K pad, pain management, therapies evaluation. Orthopedics consulted.  I discussed with Dr. Aline Brochure.  Likely now need for any surgical intervention. Temporarily hold aspirin.  Hypertensive urgency/essential hypertension Likely precipitated by pain.  Pain management. Presented with initial BP of 207/109. Not on antihypertensives PTA. As  needed oral hydralazine.  Macrocytic anemia Baseline hemoglobin probably in the 12-13 range. Monitor CBC daily for acute blood loss anemia.  Hyperlipidemia Continue statins  Hypothyroidism Continue levothyroxine and outpatient follow-up.  Hypokalemia Replaced.  Follow BMP in a.m.  Anxiety/PTSD Continue prior home meds.   Body mass index is 20.89 kg/m.    DVT prophylaxis: SCDs Start: 11/03/20 0545     Code Status: DNR Family Communication: None at bedside Disposition:  Status is: Observation  The patient will require care spanning > 2 midnights and should be moved to inpatient because: Ongoing active pain requiring inpatient pain management  Dispo: The patient is from: Home              Anticipated d/c is to: TBD pending therapies evaluation              Patient currently is not medically stable to d/c.   Difficult to place patient No        Consultants:   Orthopedics  Procedures:   None  Antimicrobials:    Anti-infectives (From admission, onward)   None        Subjective:  Patient seen this morning in the ED along with her female RN at bedside as chaperone.  Reports ongoing severe, 9/10 left hip region pain, worse with movement and hence has been lying still.  Last night was unable to get up in the ED and weight-bear on that leg.  No other complaints reported.  Objective:   Vitals:   11/03/20 0600 11/03/20 0745 11/03/20 0800 11/03/20 1003  BP: (!) 168/94  (!) 174/84 (!) 164/85  Pulse: 70 79 74 74  Resp: 18  16 17   Temp:  98.1 F (36.7 C)  TempSrc:    Oral  SpO2: 95% 98% 97% 99%  Weight:      Height:        General exam: Pleasant elderly female, moderately built and thinly nourished, lying supine in bed.  Oral mucosa moist. Respiratory system: Clear to auscultation. Respiratory effort normal. Cardiovascular system: S1 & S2 heard, RRR. No JVD, murmurs, rubs, gallops or clicks. No pedal edema. Gastrointestinal system: Abdomen is  nondistended, soft and nontender. No organomegaly or masses felt. Normal bowel sounds heard. Central nervous system: Alert and oriented. No focal neurological deficits. Extremities: Symmetric 5 x 5 power.  Bilateral knee surgical scars.  Left hip mild superficial bruising noted.  Tender to superficial palpation in left buttock region.  Did not attempt deep palpation and unable to feel the hematoma. Skin: No rashes, lesions or ulcers Psychiatry: Judgement and insight appear normal. Mood & affect appropriate.     Data Reviewed:   I have personally reviewed following labs and imaging studies   CBC: Recent Labs  Lab 11/03/20 0118 11/03/20 0806  WBC 7.7 6.9  NEUTROABS 5.7  --   HGB 12.0 11.9*  HCT 35.7* 35.6*  MCV 100.0 100.8*  PLT 243 627    Basic Metabolic Panel: Recent Labs  Lab 11/03/20 0118  NA 134*  K 3.4*  CL 99  CO2 25  GLUCOSE 131*  BUN 9  CREATININE 0.61  CALCIUM 8.9    Liver Function Tests: No results for input(s): AST, ALT, ALKPHOS, BILITOT, PROT, ALBUMIN in the last 168 hours.  CBG: No results for input(s): GLUCAP in the last 168 hours.  Microbiology Studies:   Recent Results (from the past 240 hour(s))  Resp Panel by RT-PCR (Flu A&B, Covid) Nasopharyngeal Swab     Status: None   Collection Time: 11/03/20  4:36 AM   Specimen: Nasopharyngeal Swab; Nasopharyngeal(NP) swabs in vial transport medium  Result Value Ref Range Status   SARS Coronavirus 2 by RT PCR NEGATIVE NEGATIVE Final    Comment: (NOTE) SARS-CoV-2 target nucleic acids are NOT DETECTED.  The SARS-CoV-2 RNA is generally detectable in upper respiratory specimens during the acute phase of infection. The lowest concentration of SARS-CoV-2 viral copies this assay can detect is 138 copies/mL. A negative result does not preclude SARS-Cov-2 infection and should not be used as the sole basis for treatment or other patient management decisions. A negative result may occur with  improper specimen  collection/handling, submission of specimen other than nasopharyngeal swab, presence of viral mutation(s) within the areas targeted by this assay, and inadequate number of viral copies(<138 copies/mL). A negative result must be combined with clinical observations, patient history, and epidemiological information. The expected result is Negative.  Fact Sheet for Patients:  EntrepreneurPulse.com.au  Fact Sheet for Healthcare Providers:  IncredibleEmployment.be  This test is no t yet approved or cleared by the [place] FDA and  has been authorized for detection and/or diagnosis of SARS-CoV-2 by FDA under an Emergency Use Authorization (EUA). This EUA will remain  in effect (meaning this test can be used) for the duration of the COVID-19 declaration under Section 564(b)(1) of the Act, 21 U.S.C.section 360bbb-3(b)(1), unless the authorization is terminated  or revoked sooner.       Influenza A by PCR NEGATIVE NEGATIVE Final   Influenza B by PCR NEGATIVE NEGATIVE Final    Comment: (NOTE) The Xpert Xpress SARS-CoV-2/FLU/RSV plus assay is intended as an aid in the diagnosis of influenza from Nasopharyngeal swab specimens  and should not be used as a sole basis for treatment. Nasal washings and aspirates are unacceptable for Xpert Xpress SARS-CoV-2/FLU/RSV testing.  Fact Sheet for Patients: EntrepreneurPulse.com.au  Fact Sheet for Healthcare Providers: IncredibleEmployment.be  This test is not yet approved or cleared by the [place] FDA and has been authorized for detection and/or diagnosis of SARS-CoV-2 by FDA under an Emergency Use Authorization (EUA). This EUA will remain in effect (meaning this test can be used) for the duration of the COVID-19 declaration under Section 564(b)(1) of the Act, 21 U.S.C. section 360bbb-3(b)(1), unless the authorization is terminated or revoked.  Performed at [facility], [address] [place], [place] [address]      Radiology Studies:  CT Cervical Spine Wo Contrast  Result Date: 11/03/2020 CLINICAL DATA:  Neck soreness after a fall at 2 p.m. No loss of consciousness. EXAM: CT CERVICAL SPINE WITHOUT CONTRAST TECHNIQUE: Multidetector CT imaging of the cervical spine was performed without intravenous contrast. Multiplanar CT image reconstructions were also generated. COMPARISON:  Cervical spine radiographs 03/19/2020 FINDINGS: Alignment: Slight anterior subluxations at C2-3 and C7-T1 are unchanged since prior study, likely degenerative. Otherwise normal alignment of the cervical spine and posterior elements. Skull base and vertebrae: Skull base appears intact. No vertebral compression deformities. No focal bone lesion or bone destruction. Bone cortex appears intact. Soft tissues and spinal canal: No prevertebral soft tissue swelling. No abnormal soft tissue mass or infiltration. Calcification in the cervical carotid arteries. Disc levels: Degenerative changes throughout with narrowed interspaces and endplate hypertrophic change. Prominent posterior disc osteophyte complexes may cause encroachment upon the anterior thecal sac at C3-4, C4-5, C5-6, and C6-7 levels. Degenerative changes in the facet joints. Upper chest: Lung apices are clear. Other: None. IMPRESSION: 1. No acute displaced fractures identified in the cervical spine. 2. Diffuse degenerative changes throughout the cervical spine. Slight anterior subluxations at C2-3 and C7-T1 are unchanged since prior study, likely degenerative. Electronically Signed   By: Lucienne Capers M.D.   On: 11/03/2020 02:30   CT PELVIS WO CONTRAST  Result Date: 11/03/2020 CLINICAL DATA:  Pain to the left hip and left lower extremity after a fall. Left buttock pain and bruising. EXAM: CT PELVIS WITHOUT CONTRAST TECHNIQUE: Multidetector CT imaging of the pelvis was performed following the standard protocol without intravenous  contrast. COMPARISON:  11/17/2019 FINDINGS: Urinary Tract: Bladder is normal. No wall thickening, stone, or filling defect. Bowel: Visualized colon is diffusely stool-filled. Visualized small bowel are not abnormally distended. Appendix is not identified. Vascular/Lymphatic: Iliofemoral arterial vascular calcifications. Surgical clips in the left groin. Reproductive: Uterus is surgically absent. No abnormal adnexal masses. Other:  No free air or free fluid in the pelvis. Musculoskeletal: Degenerative changes demonstrated in the lower lumbar spine and in both hips. No focal bone lesion or bone destruction. Bone cortex appears intact. No acute displaced fractures identified. No significant effusion or bursal collection. Soft tissue infiltration in the subcutaneous fat over the left hip. Large posterior hematoma demonstrated in the inferior left gluteus maximus muscle posterior to the left hip, measuring 5.9 x 8.1 cm. IMPRESSION: 1. Large hematoma in the inferior left gluteus maximus muscle posterior to the left hip, measuring 5.9 x 8.1 cm. 2. Soft tissue infiltration in the subcutaneous fat over the left hip consistent with contusion. 3. Degenerative changes in the lower lumbar spine and hips. No acute fracture or dislocation. Electronically Signed   By: Lucienne Capers M.D.   On: 11/03/2020 02:19   DG Hip Unilat W or  Wo Pelvis 2-3 Views Left  Result Date: 11/03/2020 CLINICAL DATA:  Fall, left hip and leg pain EXAM: DG HIP (WITH OR WITHOUT PELVIS) 2-3V LEFT COMPARISON:  None. FINDINGS: Degenerative changes in the hips bilaterally. No acute bony abnormality. Specifically, no fracture, subluxation, or dislocation. IMPRESSION: No acute bony abnormality. Electronically Signed   By: Rolm Baptise M.D.   On: 11/03/2020 00:42   DG Femur Min 2 Views Left  Result Date: 11/03/2020 CLINICAL DATA:  Fall, left leg pain EXAM: LEFT FEMUR 2 VIEWS COMPARISON:  None. FINDINGS: Changes of left knee replacement. No hardware  complicating feature. No acute bony abnormality. Specifically, no fracture, subluxation, or dislocation. IMPRESSION: Left knee replacement. No acute bony abnormality. Electronically Signed   By: Rolm Baptise M.D.   On: 11/03/2020 00:47     Scheduled Meds:   . atorvastatin  40 mg Oral Daily  . buPROPion  300 mg Oral Daily  . levothyroxine  75 mcg Oral Q0600  . naloxone      . risperiDONE  2 mg Oral QHS  . Suvorexant  1 tablet Oral QHS    Continuous Infusions:     LOS: 0 days     Vernell Leep, MD, [place], [facility]. [facility]    To contact the attending provider between 7A-7P or the covering provider during after hours 7P-7A, please log into the web site [email or web address] and access using universal [place] password for that web site. If you do not have the password, please call the hospital operator.  11/03/2020, 1:05 PM

## 2020-11-03 NOTE — Consult Note (Signed)
Reason for Consult: hematoma left hip Referring Physician: Dr Vernell Leep  Kimberly Roth is an 75 y.o. female.  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 I had a lot of things in her hand her shoe came off on the steps she tried to put the shoe back on she fell she injured her left hip work-up for fracture was negative but she has a large hematoma of the left hip she initially could not walk and I was called to consult to check for any additional orthopedic input that may assess her inability to ambulate  However her pain though still present has resolved somewhat to the point where she can ambulate with a walker and physical therapy  Pain is located over the left hip primarily in the buttock and radiates proximally and distally slightly it does affect her range of motion and ambulatory ability.  Past Medical History:  Diagnosis Date  . Anxiety   . Breast cancer ([place])    breast bilateral  . Cataract   . History of shingles   . Hyperlipidemia   . PTSD (post-traumatic stress disorder)    related to childhood trauma  . Systemic lupus ([place])   . Uterine cancer [facility])     Past Surgical History:  Procedure Laterality Date  . ABDOMINAL HYSTERECTOMY  1976   no oopherectomy  . APPENDECTOMY    . BACK SURGERY    . BREAST IMPLANT REMOVAL Bilateral    Silicone allergy  . BREAST SURGERY Bilateral 1991   Mastectomy  . CATARACT EXTRACTION W/PHACO Right 10/30/2013   Procedure: CATARACT EXTRACTION PHACO AND INTRAOCULAR LENS PLACEMENT (IOC);  Surgeon: Tonny Branch, MD;  Location: [facility];  Service: Ophthalmology;  Laterality: Right;  CDE:10.13  . CATARACT EXTRACTION W/PHACO Left 11/10/2013   Procedure: CATARACT EXTRACTION PHACO AND INTRAOCULAR LENS PLACEMENT (IOC);  Surgeon: Tonny Branch, MD;  Location: [facility];  Service: Ophthalmology;  Laterality: Left;  CDE 9.71  . DILATION AND CURETTAGE OF UTERUS    . EYE SURGERY Bilateral 2015   Cataract - Dr Geoffry Paradise in [place]  . JOINT REPLACEMENT Left 2005    Knee x 2  . MASTECTOMY Bilateral   . PLACEMENT OF BREAST IMPLANTS Bilateral    Secondary to cancer  . RECONSTRUCTION / CORRECTION OF NIPPLE / AEROLA Bilateral   . TONSILLECTOMY    . WISDOM TOOTH EXTRACTION Bilateral    Extraction x 4  . WRIST SURGERY Right 20's   patient not exactly sure of reason    Family History  Problem Relation Age of Onset  . Heart failure Father   . Heart disease Father   . Heart attack Father 61  . Mental illness Mother   . Mental illness Sister        PTSD  . Osteoporosis Paternal Grandmother     Social History:  reports that she has never smoked. She has never used smokeless tobacco. She reports current alcohol use of about 1.0 standard drink of alcohol per week. She reports that she does not use drugs.  Allergies:  Allergies  Allergen Reactions  . Barium Sulfate Nausea And Vomiting    Readicat 2  Eyes feel bulgy and feels unwell after a few sips of Readicat 2.   . Codeine     Horrible headaches, nightmares   . Contrast Media [Iodinated Diagnostic Agents]     Respiratory arrest.   . Iodine Other (See Comments)    Respiratory arrest.   . Other Other (See Comments)    "can  tolerate paper tape for a short time" "can tolerate paper tape for a short time"   . Plaquenil [Hydroxychloroquine Sulfate]     Headaches/night terrors   . Tape     "can tolerate paper tape for a short time"  . Morphine And Related     Respiratory arrest.     Medications: I have reviewed the patient's current medications.  Results for orders placed or performed during the hospital encounter of 11/02/20 (from the past 48 hour(s))  CBC with Differential/Platelet     Status: Abnormal   Collection Time: 11/03/20  1:18 AM  Result Value Ref Range   WBC 7.7 4.0 - 10.5 K/uL   RBC 3.57 (L) 3.87 - 5.11 MIL/uL   Hemoglobin 12.0 12.0 - 15.0 g/dL   HCT 35.7 (L) 36.0 - 46.0 %   MCV 100.0 80.0 - 100.0 fL   MCH 33.6 26.0 - 34.0 pg   MCHC 33.6 30.0 - 36.0 g/dL   RDW 12.9 11.5 -  15.5 %   Platelets 243 150 - 400 K/uL   nRBC 0.0 0.0 - 0.2 %   Neutrophils Relative % 74 %   Neutro Abs 5.7 1.7 - 7.7 K/uL   Lymphocytes Relative 18 %   Lymphs Abs 1.4 0.7 - 4.0 K/uL   Monocytes Relative 6 %   Monocytes Absolute 0.5 0.1 - 1.0 K/uL   Eosinophils Relative 2 %   Eosinophils Absolute 0.1 0.0 - 0.5 K/uL   Basophils Relative 0 %   Basophils Absolute 0.0 0.0 - 0.1 K/uL   Immature Granulocytes 0 %   Abs Immature Granulocytes 0.02 0.00 - 0.07 K/uL    Comment: Performed at [facility], [address], [place], [place] [address]  Basic metabolic panel     Status: Abnormal   Collection Time: 11/03/20  1:18 AM  Result Value Ref Range   Sodium 134 (L) 135 - 145 mmol/L   Potassium 3.4 (L) 3.5 - 5.1 mmol/L   Chloride 99 98 - 111 mmol/L   CO2 25 22 - 32 mmol/L   Glucose, Bld 131 (H) 70 - 99 mg/dL    Comment: Glucose reference range applies only to samples taken after fasting for at least 8 hours.   BUN 9 8 - 23 mg/dL   Creatinine, Ser 0.61 0.44 - 1.00 mg/dL   Calcium 8.9 8.9 - 10.3 mg/dL   GFR, Estimated >60 >60 mL/min    Comment: (NOTE) Calculated using the CKD-EPI Creatinine Equation (2021)    Anion gap 10 5 - 15    Comment: Performed at [facility], [address], [place],  [address]  Resp Panel by RT-PCR (Flu A&B, Covid) Nasopharyngeal Swab     Status: None   Collection Time: 11/03/20  4:36 AM   Specimen: Nasopharyngeal Swab; Nasopharyngeal(NP) swabs in vial transport medium  Result Value Ref Range   SARS Coronavirus 2 by RT PCR NEGATIVE NEGATIVE    Comment: (NOTE) SARS-CoV-2 target nucleic acids are NOT DETECTED.  The SARS-CoV-2 RNA is generally detectable in upper respiratory specimens during the acute phase of infection. The lowest concentration of SARS-CoV-2 viral copies this assay can detect is 138 copies/mL. A negative result does not preclude SARS-Cov-2 infection and should not be used as the sole basis for treatment or other patient management  decisions. A negative result may occur with  improper specimen collection/handling, submission of specimen other than nasopharyngeal swab, presence of viral mutation(s) within the areas targeted by this assay, and inadequate number  of viral copies(<138 copies/mL). A negative result must be combined with clinical observations, patient history, and epidemiological information. The expected result is Negative.  Fact Sheet for Patients:  EntrepreneurPulse.com.au  Fact Sheet for Healthcare Providers:  IncredibleEmployment.be  This test is no t yet approved or cleared by the [place] FDA and  has been authorized for detection and/or diagnosis of SARS-CoV-2 by FDA under an Emergency Use Authorization (EUA). This EUA will remain  in effect (meaning this test can be used) for the duration of the COVID-19 declaration under Section 564(b)(1) of the Act, 21 U.S.C.section 360bbb-3(b)(1), unless the authorization is terminated  or revoked sooner.       Influenza A by PCR NEGATIVE NEGATIVE   Influenza B by PCR NEGATIVE NEGATIVE    Comment: (NOTE) The Xpert Xpress SARS-CoV-2/FLU/RSV plus assay is intended as an aid in the diagnosis of influenza from Nasopharyngeal swab specimens and should not be used as a sole basis for treatment. Nasal washings and aspirates are unacceptable for Xpert Xpress SARS-CoV-2/FLU/RSV testing.  Fact Sheet for Patients: EntrepreneurPulse.com.au  Fact Sheet for Healthcare Providers: IncredibleEmployment.be  This test is not yet approved or cleared by the [place] FDA and has been authorized for detection and/or diagnosis of SARS-CoV-2 by FDA under an Emergency Use Authorization (EUA). This EUA will remain in effect (meaning this test can be used) for the duration of the COVID-19 declaration under Section 564(b)(1) of the Act, 21 U.S.C. section 360bbb-3(b)(1), unless the authorization  is terminated or revoked.  Performed at [facility], [address], [place], [place] [address]   CBC     Status: Abnormal   Collection Time: 11/03/20  8:06 AM  Result Value Ref Range   WBC 6.9 4.0 - 10.5 K/uL   RBC 3.53 (L) 3.87 - 5.11 MIL/uL   Hemoglobin 11.9 (L) 12.0 - 15.0 g/dL   HCT 35.6 (L) 36.0 - 46.0 %   MCV 100.8 (H) 80.0 - 100.0 fL   MCH 33.7 26.0 - 34.0 pg   MCHC 33.4 30.0 - 36.0 g/dL   RDW 13.1 11.5 - 15.5 %   Platelets 267 150 - 400 K/uL   nRBC 0.0 0.0 - 0.2 %    Comment: Performed at [facility], [address], [place], [place] [address]    CT Cervical Spine Wo Contrast  Result Date: 11/03/2020 CLINICAL DATA:  Neck soreness after a fall at 2 p.m. No loss of consciousness. EXAM: CT CERVICAL SPINE WITHOUT CONTRAST TECHNIQUE: Multidetector CT imaging of the cervical spine was performed without intravenous contrast. Multiplanar CT image reconstructions were also generated. COMPARISON:  Cervical spine radiographs 03/19/2020 FINDINGS: Alignment: Slight anterior subluxations at C2-3 and C7-T1 are unchanged since prior study, likely degenerative. Otherwise normal alignment of the cervical spine and posterior elements. Skull base and vertebrae: Skull base appears intact. No vertebral compression deformities. No focal bone lesion or bone destruction. Bone cortex appears intact. Soft tissues and spinal canal: No prevertebral soft tissue swelling. No abnormal soft tissue mass or infiltration. Calcification in the cervical carotid arteries. Disc levels: Degenerative changes throughout with narrowed interspaces and endplate hypertrophic change. Prominent posterior disc osteophyte complexes may cause encroachment upon the anterior thecal sac at C3-4, C4-5, C5-6, and C6-7 levels. Degenerative changes in the facet joints. Upper chest: Lung apices are clear. Other: None. IMPRESSION: 1. No acute displaced fractures identified in the cervical spine. 2. Diffuse degenerative changes throughout the  cervical spine. Slight anterior subluxations at C2-3 and C7-T1 are unchanged since prior  study, likely degenerative. Electronically Signed   By: Lucienne Capers M.D.   On: 11/03/2020 02:30   CT PELVIS WO CONTRAST  Result Date: 11/03/2020 CLINICAL DATA:  Pain to the left hip and left lower extremity after a fall. Left buttock pain and bruising. EXAM: CT PELVIS WITHOUT CONTRAST TECHNIQUE: Multidetector CT imaging of the pelvis was performed following the standard protocol without intravenous contrast. COMPARISON:  11/17/2019 FINDINGS: Urinary Tract: Bladder is normal. No wall thickening, stone, or filling defect. Bowel: Visualized colon is diffusely stool-filled. Visualized small bowel are not abnormally distended. Appendix is not identified. Vascular/Lymphatic: Iliofemoral arterial vascular calcifications. Surgical clips in the left groin. Reproductive: Uterus is surgically absent. No abnormal adnexal masses. Other:  No free air or free fluid in the pelvis. Musculoskeletal: Degenerative changes demonstrated in the lower lumbar spine and in both hips. No focal bone lesion or bone destruction. Bone cortex appears intact. No acute displaced fractures identified. No significant effusion or bursal collection. Soft tissue infiltration in the subcutaneous fat over the left hip. Large posterior hematoma demonstrated in the inferior left gluteus maximus muscle posterior to the left hip, measuring 5.9 x 8.1 cm. IMPRESSION: 1. Large hematoma in the inferior left gluteus maximus muscle posterior to the left hip, measuring 5.9 x 8.1 cm. 2. Soft tissue infiltration in the subcutaneous fat over the left hip consistent with contusion. 3. Degenerative changes in the lower lumbar spine and hips. No acute fracture or dislocation. Electronically Signed   By: Lucienne Capers M.D.   On: 11/03/2020 02:19   DG Hip Unilat W or Wo Pelvis 2-3 Views Left  Result Date: 11/03/2020 CLINICAL DATA:  Fall, left hip and leg pain EXAM: DG HIP  (WITH OR WITHOUT PELVIS) 2-3V LEFT COMPARISON:  None. FINDINGS: Degenerative changes in the hips bilaterally. No acute bony abnormality. Specifically, no fracture, subluxation, or dislocation. IMPRESSION: No acute bony abnormality. Electronically Signed   By: Rolm Baptise M.D.   On: 11/03/2020 00:42   DG Femur Min 2 Views Left  Result Date: 11/03/2020 CLINICAL DATA:  Fall, left leg pain EXAM: LEFT FEMUR 2 VIEWS COMPARISON:  None. FINDINGS: Changes of left knee replacement. No hardware complicating feature. No acute bony abnormality. Specifically, no fracture, subluxation, or dislocation. IMPRESSION: Left knee replacement. No acute bony abnormality. Electronically Signed   By: Rolm Baptise M.D.   On: 11/03/2020 00:47    Review of Systems  She can move the leg so there is no weakness and she has no numbness she has no shortness of breath or chest pain skin is intact other than bruising   Blood pressure (!) 164/85, pulse 74, temperature 98.1 F (36.7 C), temperature source Oral, resp. rate 17, height 5' 4.5" (1.638 m), weight 56.1 kg, SpO2 99 %. Physical Exam Constitutional:      General: She is not in acute distress.    Appearance: She is well-developed.  Cardiovascular:     Comments: No peripheral edema Musculoskeletal:     Comments: Right and left upper extremity  Skin normal, tenderness none, range of motion normal, instability all joints reduced, muscle tone normal  Left hip hematoma left gluteal region Skin bruised in that area Tender to touch No dislocation Normal muscle tone  Skin:    General: Skin is warm and dry.     Capillary Refill: Capillary refill takes less than 2 seconds.  Neurological:     Mental Status: She is alert and oriented to person, place, and time.     Sensory:  No sensory deficit.     Coordination: Coordination normal.     Gait: Gait abnormal.     Deep Tendon Reflexes: Reflexes are normal and symmetric.  Psychiatric:        Mood and Affect: Mood normal.         Behavior: Behavior normal.        Thought Content: Thought content normal.        Judgment: Judgment normal.     Assessment/Plan: X-rays of the hip I read as normal  CT scan of the hip I read as a large hematoma in the left gluteal region  Hematoma left hip patient now ambulating with a walker follow-up with me in 3 weeks or so.  Weight-bear as tolerated.  Arther Abbott 11/03/2020, 4:42 PM

## 2020-11-04 ENCOUNTER — Ambulatory Visit: Payer: Medicare Other

## 2020-11-04 DIAGNOSIS — R52 Pain, unspecified: Secondary | ICD-10-CM | POA: Diagnosis not present

## 2020-11-04 DIAGNOSIS — S7002XD Contusion of left hip, subsequent encounter: Secondary | ICD-10-CM | POA: Diagnosis not present

## 2020-11-04 LAB — COMPREHENSIVE METABOLIC PANEL
ALT: 18 U/L (ref 0–44)
AST: 20 U/L (ref 15–41)
Albumin: 3.6 g/dL (ref 3.5–5.0)
Alkaline Phosphatase: 70 U/L (ref 38–126)
Anion gap: 8 (ref 5–15)
BUN: 14 mg/dL (ref 8–23)
CO2: 27 mmol/L (ref 22–32)
Calcium: 9.2 mg/dL (ref 8.9–10.3)
Chloride: 99 mmol/L (ref 98–111)
Creatinine, Ser: 0.77 mg/dL (ref 0.44–1.00)
GFR, Estimated: >60 mL/min (ref >60)
Glucose, Bld: 102 mg/dL — ABNORMAL HIGH (ref 70–99)
Potassium: 4.3 mmol/L (ref 3.5–5.1)
Sodium: 134 mmol/L — ABNORMAL LOW (ref 135–145)
Total Bilirubin: 0.7 mg/dL (ref 0.3–1.2)
Total Protein: 6 g/dL — ABNORMAL LOW (ref 6.5–8.1)

## 2020-11-04 LAB — CBC
HCT: 33.2 % — ABNORMAL LOW (ref 36.0–46.0)
Hemoglobin: 10.9 g/dL — ABNORMAL LOW (ref 12.0–15.0)
MCH: 33.5 pg (ref 26.0–34.0)
MCHC: 32.8 g/dL (ref 30.0–36.0)
MCV: 102.2 fL — ABNORMAL HIGH (ref 80.0–100.0)
Platelets: 218 10*3/uL (ref 150–400)
RBC: 3.25 MIL/uL — ABNORMAL LOW (ref 3.87–5.11)
RDW: 13.2 % (ref 11.5–15.5)
WBC: 5.2 10*3/uL (ref 4.0–10.5)
nRBC: 0 % (ref 0.0–0.2)

## 2020-11-04 LAB — PROTIME-INR
INR: 1 (ref 0.8–1.2)
Prothrombin Time: 12.9 seconds (ref 11.4–15.2)

## 2020-11-04 LAB — MAGNESIUM: Magnesium: 2.3 mg/dL (ref 1.7–2.4)

## 2020-11-04 MED ORDER — LIDOCAINE 5 % EX PTCH
1.0000 | MEDICATED_PATCH | CUTANEOUS | Status: DC
  Administered 2020-11-04: 1 via TRANSDERMAL
  Filled 2020-11-04: qty 1

## 2020-11-04 MED ORDER — ACETAMINOPHEN 500 MG PO TABS
500.0000 mg | ORAL_TABLET | Freq: Three times a day (TID) | ORAL | Status: DC
  Administered 2020-11-04 – 2020-11-05 (×3): 500 mg via ORAL
  Filled 2020-11-04 (×3): qty 1

## 2020-11-04 NOTE — TOC Progression Note (Signed)
Transition of Care [facility]) - Progression Note    Patient Details  Name: GINNIFER CREELMAN MRN: [id number] Date of Birth: [birth year]  Transition of Care [facility]) CM/SW Contact  Natasha Bence, LCSW Phone Number: 11/04/2020, 2:01 PM  Clinical Narrative:    CSW notified of patient's PCP and [place] orders. CSW [facility] to [facility] agreeable to to take patient. Patient reported that she did not have a PCP. TOC secretary followed up with PCP providers in the region. [facility] reported that patient is an active patient with them. Patient displayed a few memory issues as patient was not able to remember CSW within less than 12 hours between visits to the room. CSW discussed patient's needs for 20+ minutes during each visit. TOC to follow.    Expected Discharge Plan: [place] Barriers to Discharge: Barriers Resolved  Expected Discharge Plan and Services Expected Discharge Plan: [place] In-house Referral: NA Discharge Planning Services: NA Post Acute Care Choice: NA                   DME Arranged: N/A DME Agency: NA       HH Arranged: PT,Social Work [employer] Agency: Microbiologist ([place]) Date [place]: 11/04/20 Time [place]: 1106 Representative spoke with at [place]: [place] ([place]) Interventions    Readmission Risk Interventions No flowsheet data found.

## 2020-11-04 NOTE — Progress Notes (Signed)
PROGRESS NOTE   Kimberly Roth  FBP:[id number]    DOB: [birth year]    DOA: 11/02/2020  PCP: [name], FNP   I have briefly reviewed patients previous medical records in [facility].  Chief Complaint  Patient presents with  . 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  CT pelvis showed a large left gluteus maximus hematoma along with left hip area contusion.  Admitted for ambulatory dysfunction due to left buttock area hematoma with associated pain following mechanical fall.  Treating supportively with pain management.  Orthopedics consulted.  Pain better but not controlled well enough to DC home with she should be able to take care of her elderly spouse who is also unwell with health issues   Assessment & Plan:  Active Problems:   Intractable pain   Hematoma of left hip   Ambulatory dysfunction, intractable pain following mechanical fall and a large left gluteal area hematoma:  Sustained after mechanical fall at home.  Reportedly fell backwards down 3 steps.  No LOC or head injury.  CT C-spine: No acute displaced fractures.  CT pelvis: Large hematoma in the inferior left gluteus maximus muscle posterior to the left hip, measuring 5.9 x 8.1 cm.  Soft tissue infiltration in the subcutaneous fat over the left hip consistent with contusion.  No acute fractures or dislocations  Left femur x-ray: S/p left knee replacement.  No acute bony abnormality  Left hip x-ray: No acute bony abnormality.  Neurovascular bundle LLE intact.  Treat supportively with K pad, pain management, therapies evaluation.  PT yesterday recommended SNF but on reevaluation today feel that she is better with pain control, mobility and I  recommend home health with PT.  Dr. Aline Brochure, orthopedic input from 4/6 appreciated, no specific recommendations, weightbearing as tolerated and follow-up with him in the office in 3 weeks.  Pain was better controlled when I saw her early this morning.  However since then she has ambulated with PT and now reports some worsening pain and does not feel comfortable enough to go home to be able to care for her spouse.  Multimodality pain control: Started scheduled Tylenol 500 mg 3 times daily, added lidocaine patch to left buttock, continue as needed Vicodin (discussed extensively regarding risks, benefits and side effects of opioids and restrictions/precautions such as driving etc. which she verbalized understanding) and as needed IV fentanyl for severe pain, K pad.  Reassess in a.m.   Hypertensive urgency/essential hypertension  Likely precipitated by pain.  Pain management.  Presented with initial BP of 207/109.  Not on antihypertensives PTA.  Better controlled.  Could consider as needed oral hydralazine for SBP >170.  Macrocytic anemia  Baseline hemoglobin probably in the 12-13 range.  Mild gradual drop since admission, down to 10.9, possibly expected from the hematoma but no concern for ongoing bleeding.  Follow CBC in a.m.  Hyperlipidemia  Continue statins  Hypothyroidism  Continue levothyroxine and outpatient follow-up.  Hypokalemia  Replaced.  Magnesium normal.  Anxiety/PTSD  Continue prior home meds.   Body mass index is 20.89 kg/m.    DVT prophylaxis: SCDs Start: 11/03/20 0545     Code Status: DNR Family Communication: None at bedside Disposition:  Status is: Inpatient.    Dispo: The patient  is from: Home              Anticipated d/c is to: Home, likely 11/05/2020.              Patient currently is not medically stable to d/c.   Difficult to place patient No        Consultants:   Orthopedics  Procedures:   None  Antimicrobials:     Anti-infectives (From admission, onward)   None        Subjective:  Pain was better controlled when I saw her early this morning, rated as 5/10 in severity and reported ambulating herself to the bathroom without assistance or assistive device.  As per RN report, however since then she has ambulated with PT and now reports some worsening pain and does not feel comfortable enough to go home to be able to care for her spouse.  Objective:   Vitals:   11/03/20 0800 11/03/20 1003 11/03/20 2046 11/04/20 0246  BP: (!) 174/84 (!) 164/85 (!) 160/72 103/64  Pulse: 74 74 68 80  Resp: 16 17 20 20   Temp:  98.1 F (36.7 C) 97.7 F (36.5 C) 97.7 F (36.5 C)  TempSrc:  Oral Oral   SpO2: 97% 99% 100% 96%  Weight:      Height:        General exam: Pleasant elderly female, moderately built and thinly nourished, lying supine in bed.  Oral mucosa moist. Respiratory system: Clear to auscultation. Respiratory effort normal. Cardiovascular system: S1 & S2 heard, RRR. No JVD, murmurs, rubs, gallops or clicks. No pedal edema. Gastrointestinal system: Abdomen is nondistended, soft and nontender. No organomegaly or masses felt. Normal bowel sounds heard. Central nervous system: Alert and oriented. No focal neurological deficits. Extremities: Symmetric 5 x 5 power.  Bilateral knee surgical scars.  Left hip mild superficial bruising noted, unchanged compared to 4/6.  Tender to superficial palpation in left buttock region.  Did not attempt deep palpation and unable to feel the hematoma.  Patient was examined along with a female RN at bedside as chaperone. Skin: No rashes, lesions or ulcers Psychiatry: Judgement and insight appear normal. Mood & affect appropriate.     Data Reviewed:   I have personally reviewed following labs and imaging studies   CBC: Recent Labs  Lab 11/03/20 0118 11/03/20 0806 11/04/20 0449  WBC 7.7 6.9 5.2  NEUTROABS 5.7  --   --   HGB 12.0 11.9* 10.9*  HCT 35.7* 35.6*  33.2*  MCV 100.0 100.8* 102.2*  PLT 243 267 867    Basic Metabolic Panel: Recent Labs  Lab 11/03/20 0118 11/04/20 0449  NA 134* 134*  K 3.4* 4.3  CL 99 99  CO2 25 27  GLUCOSE 131* 102*  BUN 9 14  CREATININE 0.61 0.77  CALCIUM 8.9 9.2  MG  --  2.3    Liver Function Tests: Recent Labs  Lab 11/04/20 0449  AST 20  ALT 18  ALKPHOS 70  BILITOT 0.7  PROT 6.0*  ALBUMIN 3.6    CBG: No results for input(s): GLUCAP in the last 168 hours.  Microbiology Studies:   Recent Results (from the past 240 hour(s))  Resp Panel by RT-PCR (Flu A&B, Covid) Nasopharyngeal Swab     Status: None   Collection Time: 11/03/20  4:36 AM   Specimen: Nasopharyngeal Swab; Nasopharyngeal(NP) swabs in vial transport medium  Result Value Ref Range Status   SARS Coronavirus 2 by RT PCR NEGATIVE NEGATIVE Final  Comment: (NOTE) SARS-CoV-2 target nucleic acids are NOT DETECTED.  The SARS-CoV-2 RNA is generally detectable in upper respiratory specimens during the acute phase of infection. The lowest concentration of SARS-CoV-2 viral copies this assay can detect is 138 copies/mL. A negative result does not preclude SARS-Cov-2 infection and should not be used as the sole basis for treatment or other patient management decisions. A negative result may occur with  improper specimen collection/handling, submission of specimen other than nasopharyngeal swab, presence of viral mutation(s) within the areas targeted by this assay, and inadequate number of viral copies(<138 copies/mL). A negative result must be combined with clinical observations, patient history, and epidemiological information. The expected result is Negative.  Fact Sheet for Patients:  EntrepreneurPulse.com.au  Fact Sheet for Healthcare Providers:  IncredibleEmployment.be  This test is no t yet approved or cleared by the [place] FDA and  has been authorized for detection and/or diagnosis of  SARS-CoV-2 by FDA under an Emergency Use Authorization (EUA). This EUA will remain  in effect (meaning this test can be used) for the duration of the COVID-19 declaration under Section 564(b)(1) of the Act, 21 U.S.C.section 360bbb-3(b)(1), unless the authorization is terminated  or revoked sooner.       Influenza A by PCR NEGATIVE NEGATIVE Final   Influenza B by PCR NEGATIVE NEGATIVE Final    Comment: (NOTE) The Xpert Xpress SARS-CoV-2/FLU/RSV plus assay is intended as an aid in the diagnosis of influenza from Nasopharyngeal swab specimens and should not be used as a sole basis for treatment. Nasal washings and aspirates are unacceptable for Xpert Xpress SARS-CoV-2/FLU/RSV testing.  Fact Sheet for Patients: EntrepreneurPulse.com.au  Fact Sheet for Healthcare Providers: IncredibleEmployment.be  This test is not yet approved or cleared by the [place] FDA and has been authorized for detection and/or diagnosis of SARS-CoV-2 by FDA under an Emergency Use Authorization (EUA). This EUA will remain in effect (meaning this test can be used) for the duration of the COVID-19 declaration under Section 564(b)(1) of the Act, 21 U.S.C. section 360bbb-3(b)(1), unless the authorization is terminated or revoked.  Performed at [facility], [address], [place], [place] [address]      Radiology Studies:  CT Cervical Spine Wo Contrast  Result Date: 11/03/2020 CLINICAL DATA:  Neck soreness after a fall at 2 p.m. No loss of consciousness. EXAM: CT CERVICAL SPINE WITHOUT CONTRAST TECHNIQUE: Multidetector CT imaging of the cervical spine was performed without intravenous contrast. Multiplanar CT image reconstructions were also generated. COMPARISON:  Cervical spine radiographs 03/19/2020 FINDINGS: Alignment: Slight anterior subluxations at C2-3 and C7-T1 are unchanged since prior study, likely degenerative. Otherwise normal alignment of the cervical spine and  posterior elements. Skull base and vertebrae: Skull base appears intact. No vertebral compression deformities. No focal bone lesion or bone destruction. Bone cortex appears intact. Soft tissues and spinal canal: No prevertebral soft tissue swelling. No abnormal soft tissue mass or infiltration. Calcification in the cervical carotid arteries. Disc levels: Degenerative changes throughout with narrowed interspaces and endplate hypertrophic change. Prominent posterior disc osteophyte complexes may cause encroachment upon the anterior thecal sac at C3-4, C4-5, C5-6, and C6-7 levels. Degenerative changes in the facet joints. Upper chest: Lung apices are clear. Other: None. IMPRESSION: 1. No acute displaced fractures identified in the cervical spine. 2. Diffuse degenerative changes throughout the cervical spine. Slight anterior subluxations at C2-3 and C7-T1 are unchanged since prior study, likely degenerative. Electronically Signed   By: Lucienne Capers M.D.   On: 11/03/2020 02:30  CT PELVIS WO CONTRAST  Result Date: 11/03/2020 CLINICAL DATA:  Pain to the left hip and left lower extremity after a fall. Left buttock pain and bruising. EXAM: CT PELVIS WITHOUT CONTRAST TECHNIQUE: Multidetector CT imaging of the pelvis was performed following the standard protocol without intravenous contrast. COMPARISON:  11/17/2019 FINDINGS: Urinary Tract: Bladder is normal. No wall thickening, stone, or filling defect. Bowel: Visualized colon is diffusely stool-filled. Visualized small bowel are not abnormally distended. Appendix is not identified. Vascular/Lymphatic: Iliofemoral arterial vascular calcifications. Surgical clips in the left groin. Reproductive: Uterus is surgically absent. No abnormal adnexal masses. Other:  No free air or free fluid in the pelvis. Musculoskeletal: Degenerative changes demonstrated in the lower lumbar spine and in both hips. No focal bone lesion or bone destruction. Bone cortex appears intact. No acute  displaced fractures identified. No significant effusion or bursal collection. Soft tissue infiltration in the subcutaneous fat over the left hip. Large posterior hematoma demonstrated in the inferior left gluteus maximus muscle posterior to the left hip, measuring 5.9 x 8.1 cm. IMPRESSION: 1. Large hematoma in the inferior left gluteus maximus muscle posterior to the left hip, measuring 5.9 x 8.1 cm. 2. Soft tissue infiltration in the subcutaneous fat over the left hip consistent with contusion. 3. Degenerative changes in the lower lumbar spine and hips. No acute fracture or dislocation. Electronically Signed   By: Lucienne Capers M.D.   On: 11/03/2020 02:19   DG Hip Unilat W or Wo Pelvis 2-3 Views Left  Result Date: 11/03/2020 CLINICAL DATA:  Fall, left hip and leg pain EXAM: DG HIP (WITH OR WITHOUT PELVIS) 2-3V LEFT COMPARISON:  None. FINDINGS: Degenerative changes in the hips bilaterally. No acute bony abnormality. Specifically, no fracture, subluxation, or dislocation. IMPRESSION: No acute bony abnormality. Electronically Signed   By: Rolm Baptise M.D.   On: 11/03/2020 00:42   DG Femur Min 2 Views Left  Result Date: 11/03/2020 CLINICAL DATA:  Fall, left leg pain EXAM: LEFT FEMUR 2 VIEWS COMPARISON:  None. FINDINGS: Changes of left knee replacement. No hardware complicating feature. No acute bony abnormality. Specifically, no fracture, subluxation, or dislocation. IMPRESSION: Left knee replacement. No acute bony abnormality. Electronically Signed   By: Rolm Baptise M.D.   On: 11/03/2020 00:47     Scheduled Meds:   . acetaminophen  500 mg Oral TID  . atorvastatin  40 mg Oral Daily  . buPROPion  300 mg Oral Daily  . levothyroxine  75 mcg Oral Q0600  . lidocaine  1 patch Transdermal Q24H  . risperiDONE  2 mg Oral QHS  . Suvorexant  1 tablet Oral QHS    Continuous Infusions:     LOS: 1 day     Vernell Leep, MD, [place], [facility]. [facility]    To contact the attending provider  between 7A-7P or the covering provider during after hours 7P-7A, please log into the web site [email or web address] and access using universal [place] password for that web site. If you do not have the password, please call the hospital operator.  11/04/2020, 11:41 AM

## 2020-11-04 NOTE — Progress Notes (Signed)
Physical Therapy Treatment Patient Details Name: Kimberly Roth MRN: [id number] DOB: [birth year] Today's Date: 11/04/2020    History of Present Illness Kimberly Roth  is a 75 y.o. female, with history of uterine cancer, systemic lupus diagnosed in 57, PTSD, hyperlipidemia, breast cancer, and more presents the ED with a chief complaint of fall.  Patient reports that around 3 PM on 5 April she just returned home from an appointment, walked up 3 stairs, bend over to adjust her shoe, and fell backwards down 3 stairs.  She reports she did not hit her head she did not lose consciousness.  Patient is on aspirin but she is not on any other anticoagulation.  Patient reports that she was able to get up on her own, walk back up to 3 stairs and go in the house.  She promptly threw away that she was because she felt they were a fall risk.  She was able to go about her daily routine, cooking lunch for herself and her husband etc., but then she felt extremely fatigued.  She took a nap and when she woke up her pain was excruciating per her report.  Pain was located in the left hip per her report.  She describes it as sharp, constant, not throbbing.  She attempted repositioning and it did not help.  She was still able to walk to the bathroom and back without assistance.  She tried to distract herself by reading a book.  When she tried to get up after sitting for long period she could not stand up.  Patient then decided it was time to come into the ER.    PT Comments    REASSESSMENT: Patient demonstrates much improvement for functional mobility and gait, able to ambulate in room and hallways without use of AD with no loss of balance, good return for going up/down steps using 1 side rail with alternating pattern, opening doors and transferring to/off low commode in bathroom without loss of balance while left hip pain remaining at baseline at 5/10.  Patient tolerated sitting up at bedside after therapy.  Patient will benefit  from continued physical therapy in hospital and recommended venue below to increase strength, balance, endurance for safe ADLs and gait.    Follow Up Recommendations  Home health PT;Supervision - Intermittent     Equipment Recommendations  None recommended by PT    Recommendations for Other Services       Precautions / Restrictions Precautions Precautions: None Restrictions Weight Bearing Restrictions: No    Mobility  Bed Mobility Overal bed mobility: Needs Assistance Bed Mobility: Supine to Sit;Sit to Supine     Supine to sit: Supervision Sit to supine: Modified independent (Device/Increase time)   General bed mobility comments: demonstrates good return for supine<>sitting during bed mobility after verbal cues to prop up on elbows    Transfers Overall transfer level: Modified independent Equipment used: None Transfers: Sit to/from [facility] Sit to Stand: Modified independent (Device/Increase time) Stand pivot transfers: Modified independent (Device/Increase time)       General transfer comment: demonstrates good return for completing transfers to chair and commode in bathroom without loss of balance  Ambulation/Gait Ambulation/Gait assistance: Modified independent (Device/Increase time);Supervision Gait Distance (Feet): 120 Feet Assistive device: None Gait Pattern/deviations: Decreased step length - left;Decreased stance time - left;Drifts right/left Gait velocity: decreased   General Gait Details: demonstrates increased endurance/distance for ambulation in room/hallway with occasional drifting left/right without loss of balance, did not have to lean on nearby  objects for support or require use of RW   Stairs Stairs: Yes Stairs assistance: Modified independent (Device/Increase time);Supervision Stair Management: One rail Right;One rail Left;Alternating pattern Number of Stairs: 5 General stair comments: demonstrates good return for going  up/down steps in stairwell using 1 siderail without loss of balance   Wheelchair Mobility    Modified Rankin (Stroke Patients Only)       Balance Overall balance assessment: Needs assistance Sitting-balance support: Feet supported;No upper extremity supported Sitting balance-Leahy Scale: Good Sitting balance - Comments: seated at EOB   Standing balance support: During functional activity;No upper extremity supported Standing balance-Leahy Scale: Fair Standing balance comment: fair/good without use of AD                            Cognition Arousal/Alertness: Awake/alert Behavior During Therapy: WFL for tasks assessed/performed Overall Cognitive Status: Within Functional Limits for tasks assessed                                        Exercises General Exercises - Lower Extremity Long Arc Quad: Seated;AROM;Strengthening;Both;10 reps Hip Flexion/Marching: Seated;AROM;Strengthening;Both;10 reps Toe Raises: Seated;AROM;Strengthening;Both;10 reps Heel Raises: Seated;AROM;Strengthening;Both;10 reps    General Comments        Pertinent Vitals/Pain Pain Assessment: 0-10 Pain Score: 5  Pain Location: left hip Pain Descriptors / Indicators: Sore Pain Intervention(s): Limited activity within patient's tolerance;Monitored during session;Repositioned    Home Living                      Prior Function            PT Goals (current goals can now be found in the care plan section) Acute Rehab PT Goals Patient Stated Goal: Return home with family to assist PT Goal Formulation: With patient Time For Goal Achievement: 11/06/20 Potential to Achieve Goals: Good Progress towards PT goals: Progressing toward goals    Frequency    Min 3X/week      PT Plan Discharge plan needs to be updated    Co-evaluation              AM-PAC PT "6 Clicks" Mobility   Outcome Measure  Help needed turning from your back to your side while in a  flat bed without using bedrails?: None Help needed moving from lying on your back to sitting on the side of a flat bed without using bedrails?: A Little Help needed moving to and from a bed to a chair (including a wheelchair)?: None Help needed standing up from a chair using your arms (e.g., wheelchair or bedside chair)?: None Help needed to walk in hospital room?: None Help needed climbing 3-5 steps with a railing? : A Little 6 Click Score: 22    End of Session   Activity Tolerance: Patient tolerated treatment well Patient left: in bed;with call bell/phone within reach Nurse Communication: Mobility status PT Visit Diagnosis: Unsteadiness on feet (R26.81);Other abnormalities of gait and mobility (R26.89);Muscle weakness (generalized) (M62.81)     Time: 1517-6160 PT Time Calculation (min) (ACUTE ONLY): 30 min  Charges:  $Gait Training: 8-22 mins $Therapeutic Exercise: 8-22 mins                     9:19 AM, 11/04/20 Lonell Grandchild, MPT Physical Therapist with [facility] 336 [phone] office [phone] mobile phone

## 2020-11-04 NOTE — Progress Notes (Signed)
Pt stated that her pain has increased since she had worked with PT. Pt said " I cannot go home in this much pain, I cannot take care of my disabled husband either, Please ask him to cancel the discharge" MD notified. MD ordered lidocaine patch to help with pain.

## 2020-11-05 DIAGNOSIS — S7002XD Contusion of left hip, subsequent encounter: Secondary | ICD-10-CM | POA: Diagnosis not present

## 2020-11-05 DIAGNOSIS — R52 Pain, unspecified: Secondary | ICD-10-CM | POA: Diagnosis not present

## 2020-11-05 LAB — CBC
HCT: 31.5 % — ABNORMAL LOW (ref 36.0–46.0)
Hemoglobin: 10.4 g/dL — ABNORMAL LOW (ref 12.0–15.0)
MCH: 34.1 pg — ABNORMAL HIGH (ref 26.0–34.0)
MCHC: 33 g/dL (ref 30.0–36.0)
MCV: 103.3 fL — ABNORMAL HIGH (ref 80.0–100.0)
Platelets: 232 10*3/uL (ref 150–400)
RBC: 3.05 MIL/uL — ABNORMAL LOW (ref 3.87–5.11)
RDW: 13.4 % (ref 11.5–15.5)
WBC: 5.2 10*3/uL (ref 4.0–10.5)
nRBC: 0 % (ref 0.0–0.2)

## 2020-11-05 MED ORDER — ACETAMINOPHEN 325 MG PO TABS: 650.0000 mg | ORAL_TABLET | ORAL | Status: AC | PRN

## 2020-11-05 MED ORDER — TRAMADOL HCL 50 MG PO TABS: 50.0000 mg | ORAL_TABLET | Freq: Four times a day (QID) | ORAL | 0 refills | Status: AC | PRN

## 2020-11-05 NOTE — Discharge Summary (Signed)
Physician Discharge Summary  Kimberly Roth VVO:[id number] DOB: November 16, 1945  PCP: [name], [place]  Admitted from: Home Discharged to: Home  Admit date: 11/02/2020 Discharge date: 11/05/2020  Recommendations for Outpatient Follow-up:    Follow-up Information    [facility] Follow up.   Why: PT/SW Contact information: [address] [place], Kimberly [place]. Schedule an appointment as soon as possible for a visit in 1 week(s).   Specialty: Family Medicine Why: To be seen with repeat labs (CBC & BMP). Contact information: [place] [address] [phone]        Kimberly Civil, MD. Schedule an appointment as soon as possible for a visit in 3 week(s).   Specialties: Orthopedic Surgery, Radiology Contact information: [address] [place] [address] [place] Orders (From admission, onward)    Start     Ordered   11/04/20 [place]  At discharge       Question Answer Comment  To provide the following care/treatments PT   To provide the following care/treatments Social work      11/04/20 1137           Equipment/Devices: None    Discharge Condition: Improved and stable   Code Status: DNR Diet recommendation:  Discharge Diet Orders (From admission, onward)    Start     Ordered   11/05/20 0000  Diet - low sodium heart healthy        11/05/20 0820           Discharge Diagnoses:  Active Problems:   Intractable pain   Hematoma of left hip   Brief Summary: 75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  CT pelvis showed a large left gluteus maximus hematoma  along with left hip area contusion.  Admitted for ambulatory dysfunction due to left buttock area hematoma with associated pain following mechanical fall.  Treating supportively with pain management.  Orthopedics consulted.  Pain better on 4/7 but not controlled well enough to DC home where she would be able to take care of her elderly spouse who is also unwell with health issues.   Assessment & Plan:    Ambulatory dysfunction, intractable pain following mechanical fall and a large left gluteal area hematoma:  Sustained after mechanical fall at home.  Reportedly fell backwards down 3 steps.  No LOC or head injury.  CT C-spine: No acute displaced fractures.  CT pelvis: Large hematoma in the inferior left gluteus maximus muscle posterior to the left hip, measuring 5.9 x 8.1 cm.  Soft tissue infiltration in the subcutaneous fat over the left hip consistent with contusion.  No acute fractures or dislocations  Left femur x-ray: S/p left knee replacement.  No acute bony abnormality  Left hip x-ray: No acute bony abnormality.  Neurovascular bundle LLE intact.  Treat supportively with K pad, pain management, therapies evaluation.  PT initially recommended SNF but on reevaluation felt that she was better with her pain control, mobility and recommend home health with PT. Moreover patient not really keen on going to SNF  Dr. Aline Brochure, orthopedic  input from 4/6 appreciated, no specific recommendations, weightbearing as tolerated and follow-up with him in the office in 3 weeks.  Multimodality pain control: Started scheduled Tylenol 500 mg 3 times daily, added lidocaine patch to left buttock, continue as needed Vicodin (discussed extensively regarding risks, benefits and side effects of opioids and restrictions/precautions such as driving etc. which she verbalized understanding) and as needed IV fentanyl for severe pain, K pad.  Patient reports that since yesterday afternoon, pain control has  significantly improved.  She feels like the worsening pain yesterday morning was because she overdid with physical therapy.  Since yesterday afternoon she has ambulated multiple times by herself to the bathroom with no significant pain.  She feels comfortable and ready for discharge.  Advised her that she may not be fully in a position to take care of her spouse and to see if she had any family or friends to assist.  Recommended acetaminophen for pain, also takes Voltaren and if not controlled then judicious use of tramadol which she has used in the past but has not recently filled based on my review of [place] PDMP.  Hypertensive urgency/essential hypertension  Likely precipitated by pain.  Pain management.  Presented with initial BP of 207/109.  Not on antihypertensives PTA.  Better controlled.    Macrocytic anemia  Baseline hemoglobin probably in the 12-13 range.  Mild drop in hemoglobin but stable in the mid 10 g range.  Follow CBCs as outpatient.  Hyperlipidemia  Continue statins  Hypothyroidism  Continue levothyroxine and outpatient follow-up.  Hypokalemia  Replaced.  Magnesium normal.  Anxiety/PTSD  Continue prior home meds.  Polypharmacy nutritional supplements  Appears to be duplication of medications such as vitamins etc.  Recommend close follow-up with PCP and consider stopping some of these medicines.  Body mass index is 20.89 kg/m.       Consultants:   Orthopedics  Procedures:   None   Discharge Instructions  Discharge Instructions    Call MD for:  difficulty breathing, headache or visual disturbances   Complete by: As directed    Call MD for:  extreme fatigue   Complete by: As directed    Call MD for:  persistant dizziness or light-headedness   Complete by: As directed    Call MD for:  persistant nausea and vomiting   Complete by: As directed    Call MD for:  severe uncontrolled pain   Complete by: As directed    Call MD for:   temperature >100.4   Complete by: As directed    Diet - low sodium heart healthy   Complete by: As directed    Increase activity slowly   Complete by: As directed        Medication List    STOP taking these medications   meclizine 25 MG tablet Commonly known as: ANTIVERT     TAKE these medications   acetaminophen 325 MG tablet Commonly known as: TYLENOL Take 2 tablets (650 mg total) by mouth every 4 (four) hours as needed for mild pain. What changed: reasons to take this   atorvastatin 40 MG tablet Commonly known as: Lipitor Take 1 tablet (40 mg total) by mouth daily.   Belsomra 10 MG Tabs Generic drug: Suvorexant Take 1 tablet by mouth daily.   BIOTIN PO Take 1 tablet by mouth 2 (two) times daily.   buPROPion 300 MG 24 hr tablet Commonly known as: WELLBUTRIN XL TAKE ONE (1) TABLET EACH DAY What changed: See the new instructions.  calcium-vitamin D 250-125 MG-UNIT tablet Commonly known as: OSCAL WITH D Take 1 tablet by mouth 2 (two) times daily.   cholecalciferol 1000 units tablet Commonly known as: VITAMIN D Take 1,000 Units by mouth 2 (two) times daily.   CRANBERRY EXTRACT PO Take 1 tablet by mouth 2 (two) times daily.   diclofenac 75 MG EC tablet Commonly known as: VOLTAREN Take 1 tablet (75 mg total) by mouth 2 (two) times daily. With food   levothyroxine 75 MCG tablet Commonly known as: SYNTHROID Take 1 tablet (75 mcg total) by mouth daily.   multivitamins ther. w/minerals Tabs tablet Take 1 tablet by mouth 2 (two) times daily.   multivitamin-lutein Caps capsule Take 1 capsule by mouth 2 (two) times daily.   OMEGA 3 PO Take 1 capsule by mouth 2 (two) times daily.   Potassium Gluconate 595 MG Caps Take 1 capsule by mouth daily.   risperiDONE 2 MG tablet Commonly known as: RisperDAL Take 1 tablet (2 mg total) by mouth at bedtime.   traMADol 50 MG tablet Commonly known as: Ultram Take 1 tablet (50 mg total) by mouth every 6 (six) hours as  needed for moderate pain or severe pain.   vitamin B-1 250 MG tablet Take 250 mg by mouth 2 (two) times daily.   vitamin C 1000 MG tablet Take 1,000 mg by mouth 2 (two) times daily.      Allergies  Allergen Reactions  . Barium Sulfate Nausea And Vomiting    Readicat 2  Eyes feel bulgy and feels unwell after a few sips of Readicat 2.   . Codeine     Horrible headaches, nightmares   . Contrast Media [Iodinated Diagnostic Agents]     Respiratory arrest.   . Iodine Other (See Comments)    Respiratory arrest.   . Other Other (See Comments)    "can tolerate paper tape for a short time" "can tolerate paper tape for a short time"   . Plaquenil [Hydroxychloroquine Sulfate]     Headaches/night terrors   . Tape     "can tolerate paper tape for a short time"  . Morphine And Related     Respiratory arrest.       Procedures/Studies: CT Cervical Spine Wo Contrast  Result Date: 11/03/2020 CLINICAL DATA:  Neck soreness after a fall at 2 p.m. No loss of consciousness. EXAM: CT CERVICAL SPINE WITHOUT CONTRAST TECHNIQUE: Multidetector CT imaging of the cervical spine was performed without intravenous contrast. Multiplanar CT image reconstructions were also generated. COMPARISON:  Cervical spine radiographs 03/19/2020 FINDINGS: Alignment: Slight anterior subluxations at C2-3 and C7-T1 are unchanged since prior study, likely degenerative. Otherwise normal alignment of the cervical spine and posterior elements. Skull base and vertebrae: Skull base appears intact. No vertebral compression deformities. No focal bone lesion or bone destruction. Bone cortex appears intact. Soft tissues and spinal canal: No prevertebral soft tissue swelling. No abnormal soft tissue mass or infiltration. Calcification in the cervical carotid arteries. Disc levels: Degenerative changes throughout with narrowed interspaces and endplate hypertrophic change. Prominent posterior disc osteophyte complexes may cause encroachment  upon the anterior thecal sac at C3-4, C4-5, C5-6, and C6-7 levels. Degenerative changes in the facet joints. Upper chest: Lung apices are clear. Other: None. IMPRESSION: 1. No acute displaced fractures identified in the cervical spine. 2. Diffuse degenerative changes throughout the cervical spine. Slight anterior subluxations at C2-3 and C7-T1 are unchanged since prior study, likely degenerative. Electronically Signed   By: Oren Beckmann.D.  On: 11/03/2020 02:30   CT PELVIS WO CONTRAST  Result Date: 11/03/2020 CLINICAL DATA:  Pain to the left hip and left lower extremity after a fall. Left buttock pain and bruising. EXAM: CT PELVIS WITHOUT CONTRAST TECHNIQUE: Multidetector CT imaging of the pelvis was performed following the standard protocol without intravenous contrast. COMPARISON:  11/17/2019 FINDINGS: Urinary Tract: Bladder is normal. No wall thickening, stone, or filling defect. Bowel: Visualized colon is diffusely stool-filled. Visualized small bowel are not abnormally distended. Appendix is not identified. Vascular/Lymphatic: Iliofemoral arterial vascular calcifications. Surgical clips in the left groin. Reproductive: Uterus is surgically absent. No abnormal adnexal masses. Other:  No free air or free fluid in the pelvis. Musculoskeletal: Degenerative changes demonstrated in the lower lumbar spine and in both hips. No focal bone lesion or bone destruction. Bone cortex appears intact. No acute displaced fractures identified. No significant effusion or bursal collection. Soft tissue infiltration in the subcutaneous fat over the left hip. Large posterior hematoma demonstrated in the inferior left gluteus maximus muscle posterior to the left hip, measuring 5.9 x 8.1 cm. IMPRESSION: 1. Large hematoma in the inferior left gluteus maximus muscle posterior to the left hip, measuring 5.9 x 8.1 cm. 2. Soft tissue infiltration in the subcutaneous fat over the left hip consistent with contusion. 3. Degenerative  changes in the lower lumbar spine and hips. No acute fracture or dislocation. Electronically Signed   By: Lucienne Capers M.D.   On: 11/03/2020 02:19   DG Hip Unilat W or Wo Pelvis 2-3 Views Left  Result Date: 11/03/2020 CLINICAL DATA:  Fall, left hip and leg pain EXAM: DG HIP (WITH OR WITHOUT PELVIS) 2-3V LEFT COMPARISON:  None. FINDINGS: Degenerative changes in the hips bilaterally. No acute bony abnormality. Specifically, no fracture, subluxation, or dislocation. IMPRESSION: No acute bony abnormality. Electronically Signed   By: Rolm Baptise M.D.   On: 11/03/2020 00:42   DG Femur Min 2 Views Left  Result Date: 11/03/2020 CLINICAL DATA:  Fall, left leg pain EXAM: LEFT FEMUR 2 VIEWS COMPARISON:  None. FINDINGS: Changes of left knee replacement. No hardware complicating feature. No acute bony abnormality. Specifically, no fracture, subluxation, or dislocation. IMPRESSION: Left knee replacement. No acute bony abnormality. Electronically Signed   By: Rolm Baptise M.D.   On: 11/03/2020 00:47      Subjective: Patient reports that since yesterday afternoon, pain control has significantly improved.  She feels like the worsening pain yesterday morning was because she overdid with physical therapy.  Since yesterday afternoon she has ambulated multiple times by herself to the bathroom with no significant pain.  She feels comfortable and ready for discharge.   Discharge Exam:  Vitals:   11/03/20 2046 11/04/20 0246 11/04/20 2058 11/05/20 0447  BP: (!) 160/72 103/64 (!) 142/81 131/74  Pulse: 68 80 71 77  Resp: 20 20 20 14   Temp: 97.7 F (36.5 C) 97.7 F (36.5 C) 98.5 F (36.9 C) 98.5 F (36.9 C)  TempSrc: Oral   Oral  SpO2: 100% 96% 97% 97%  Weight:      Height:        General exam: Pleasant elderly female, moderately built and thinly nourished, lying supine in bed.  Oral mucosa moist. Respiratory system: Clear to auscultation. Respiratory effort normal. Cardiovascular system: S1 & S2 heard,  RRR. No JVD, murmurs, rubs, gallops or clicks. No pedal edema. Gastrointestinal system: Abdomen is nondistended, soft and nontender. No organomegaly or masses felt. Normal bowel sounds heard. Central nervous system: Alert and oriented.  No focal neurological deficits. Extremities: Symmetric 5 x 5 power.  Bilateral knee surgical scars. Did not examine left hip today.  Following was as examined on 4/7: Left hip mild superficial bruising noted, unchanged compared to 4/6.  Tender to superficial palpation in left buttock region.  Did not attempt deep palpation and unable to feel the hematoma.  Patient was examined along with a female RN at bedside as chaperone. Skin: No rashes, lesions or ulcers Psychiatry: Judgement and insight appear normal. Mood & affect appropriate.     The results of significant diagnostics from this hospitalization (including imaging, microbiology, ancillary and laboratory) are listed below for reference.     Microbiology: Recent Results (from the past 240 hour(s))  Resp Panel by RT-PCR (Flu A&B, Covid) Nasopharyngeal Swab     Status: None   Collection Time: 11/03/20  4:36 AM   Specimen: Nasopharyngeal Swab; Nasopharyngeal(NP) swabs in vial transport medium  Result Value Ref Range Status   SARS Coronavirus 2 by RT PCR NEGATIVE NEGATIVE Final    Comment: (NOTE) SARS-CoV-2 target nucleic acids are NOT DETECTED.  The SARS-CoV-2 RNA is generally detectable in upper respiratory specimens during the acute phase of infection. The lowest concentration of SARS-CoV-2 viral copies this assay can detect is 138 copies/mL. A negative result does not preclude SARS-Cov-2 infection and should not be used as the sole basis for treatment or other patient management decisions. A negative result may occur with  improper specimen collection/handling, submission of specimen other than nasopharyngeal swab, presence of viral mutation(s) within the areas targeted by this assay, and inadequate  number of viral copies(<138 copies/mL). A negative result must be combined with clinical observations, patient history, and epidemiological information. The expected result is Negative.  Fact Sheet for Patients:  EntrepreneurPulse.com.au  Fact Sheet for Healthcare Providers:  IncredibleEmployment.be  This test is no t yet approved or cleared by the [place] FDA and  has been authorized for detection and/or diagnosis of SARS-CoV-2 by FDA under an Emergency Use Authorization (EUA). This EUA will remain  in effect (meaning this test can be used) for the duration of the COVID-19 declaration under Section 564(b)(1) of the Act, 21 U.S.C.section 360bbb-3(b)(1), unless the authorization is terminated  or revoked sooner.       Influenza A by PCR NEGATIVE NEGATIVE Final   Influenza B by PCR NEGATIVE NEGATIVE Final    Comment: (NOTE) The Xpert Xpress SARS-CoV-2/FLU/RSV plus assay is intended as an aid in the diagnosis of influenza from Nasopharyngeal swab specimens and should not be used as a sole basis for treatment. Nasal washings and aspirates are unacceptable for Xpert Xpress SARS-CoV-2/FLU/RSV testing.  Fact Sheet for Patients: EntrepreneurPulse.com.au  Fact Sheet for Healthcare Providers: IncredibleEmployment.be  This test is not yet approved or cleared by the [place] FDA and has been authorized for detection and/or diagnosis of SARS-CoV-2 by FDA under an Emergency Use Authorization (EUA). This EUA will remain in effect (meaning this test can be used) for the duration of the COVID-19 declaration under Section 564(b)(1) of the Act, 21 U.S.C. section 360bbb-3(b)(1), unless the authorization is terminated or revoked.  Performed at [facility], [address], [place], [place] [address]      Labs: CBC: Recent Labs  Lab 11/03/20 0118 11/03/20 0806 11/04/20 0449 11/05/20 0418  WBC 7.7 6.9 5.2  5.2  NEUTROABS 5.7  --   --   --   HGB 12.0 11.9* 10.9* 10.4*  HCT 35.7* 35.6* 33.2* 31.5*  MCV 100.0 100.8*  102.2* 103.3*  PLT 243 267 218 944    Basic Metabolic Panel: Recent Labs  Lab 11/03/20 0118 11/04/20 0449  NA 134* 134*  K 3.4* 4.3  CL 99 99  CO2 25 27  GLUCOSE 131* 102*  BUN 9 14  CREATININE 0.61 0.77  CALCIUM 8.9 9.2  MG  --  2.3    Liver Function Tests: Recent Labs  Lab 11/04/20 0449  AST 20  ALT 18  ALKPHOS 70  BILITOT 0.7  PROT 6.0*  ALBUMIN 3.6      Time coordinating discharge: 25 minutes  SIGNED:  Vernell Leep, MD, [place], [facility]. [facility]  To contact the attending provider between 7A-7P or the covering provider during after hours 7P-7A, please log into the web site [email or web address] and access using universal [place] password for that web site. If you do not have the password, please call the hospital operator.

## 2020-11-05 NOTE — TOC Transition Note (Signed)
Transition of Care [facility]) - CM/SW Discharge Note   Patient Details  Name: Kimberly Roth MRN: [id number] Date of Birth: 1945-12-26  [facility]) CM/SW Contact:  Natasha Bence, LCSW Phone Number: 11/05/2020, 11:40 AM   Clinical Narrative:    CSW notified of patient's need for EMS transportation. CSW completed med necessity and called EMS. TOC signing off.   Final next level of care: [place] Barriers to Discharge: Barriers Resolved   Patient Goals and CMS Choice Patient states their goals for this hospitalization and ongoing recovery are:: Return home with [facility] CMS Medicare.gov Compare Post Acute Care list provided to:: Patient Choice offered to / list presented to : Patient  Discharge Placement                    Patient and family notified of of transfer: 11/04/20  Discharge Plan and Services In-house Referral: NA Discharge Planning Services: NA Post Acute Care Choice: NA          DME Arranged: N/A DME Agency: NA       HH Arranged: PT,Social Work [place]: [place] ([place]) Date [place]: 11/04/20 Time [place]: 1106 Representative spoke with at [place]: [place] ([employer]) Interventions     Readmission Risk Interventions No flowsheet data found.

## 2020-11-05 NOTE — Discharge Instructions (Signed)

## 2020-11-06 DIAGNOSIS — M329 Systemic lupus erythematosus, unspecified: Secondary | ICD-10-CM | POA: Diagnosis not present

## 2020-11-06 DIAGNOSIS — E876 Hypokalemia: Secondary | ICD-10-CM | POA: Diagnosis not present

## 2020-11-06 DIAGNOSIS — C50911 Malignant neoplasm of unspecified site of right female breast: Secondary | ICD-10-CM | POA: Diagnosis not present

## 2020-11-06 DIAGNOSIS — I160 Hypertensive urgency: Secondary | ICD-10-CM | POA: Diagnosis not present

## 2020-11-06 DIAGNOSIS — G8911 Acute pain due to trauma: Secondary | ICD-10-CM | POA: Diagnosis not present

## 2020-11-06 DIAGNOSIS — W109XXD Fall (on) (from) unspecified stairs and steps, subsequent encounter: Secondary | ICD-10-CM | POA: Diagnosis not present

## 2020-11-06 DIAGNOSIS — F419 Anxiety disorder, unspecified: Secondary | ICD-10-CM | POA: Diagnosis not present

## 2020-11-06 DIAGNOSIS — I10 Essential (primary) hypertension: Secondary | ICD-10-CM | POA: Diagnosis not present

## 2020-11-06 DIAGNOSIS — S7002XD Contusion of left hip, subsequent encounter: Secondary | ICD-10-CM | POA: Diagnosis not present

## 2020-11-06 DIAGNOSIS — Z791 Long term (current) use of non-steroidal anti-inflammatories (NSAID): Secondary | ICD-10-CM | POA: Diagnosis not present

## 2020-11-06 DIAGNOSIS — E785 Hyperlipidemia, unspecified: Secondary | ICD-10-CM | POA: Diagnosis not present

## 2020-11-06 DIAGNOSIS — Z79891 Long term (current) use of opiate analgesic: Secondary | ICD-10-CM | POA: Diagnosis not present

## 2020-11-06 DIAGNOSIS — C50912 Malignant neoplasm of unspecified site of left female breast: Secondary | ICD-10-CM | POA: Diagnosis not present

## 2020-11-06 DIAGNOSIS — E039 Hypothyroidism, unspecified: Secondary | ICD-10-CM | POA: Diagnosis not present

## 2020-11-06 DIAGNOSIS — F4310 Post-traumatic stress disorder, unspecified: Secondary | ICD-10-CM | POA: Diagnosis not present

## 2020-11-06 DIAGNOSIS — D539 Nutritional anemia, unspecified: Secondary | ICD-10-CM | POA: Diagnosis not present

## 2020-11-06 DIAGNOSIS — Z9181 History of falling: Secondary | ICD-10-CM | POA: Diagnosis not present

## 2020-11-06 DIAGNOSIS — Z8542 Personal history of malignant neoplasm of other parts of uterus: Secondary | ICD-10-CM | POA: Diagnosis not present

## 2020-11-08 ENCOUNTER — Telehealth: Payer: Self-pay

## 2020-11-08 NOTE — Telephone Encounter (Signed)
Transition Care Management Unsuccessful Follow-up Telephone Call  Date of discharge and from where:  [facility] 11/05/20  Attempts:  1st Attempt  Reason for unsuccessful TCM follow-up call:  Left voice message

## 2020-11-09 DIAGNOSIS — I160 Hypertensive urgency: Secondary | ICD-10-CM | POA: Diagnosis not present

## 2020-11-09 DIAGNOSIS — I10 Essential (primary) hypertension: Secondary | ICD-10-CM | POA: Diagnosis not present

## 2020-11-09 DIAGNOSIS — W109XXD Fall (on) (from) unspecified stairs and steps, subsequent encounter: Secondary | ICD-10-CM | POA: Diagnosis not present

## 2020-11-09 DIAGNOSIS — S7002XD Contusion of left hip, subsequent encounter: Secondary | ICD-10-CM | POA: Diagnosis not present

## 2020-11-09 DIAGNOSIS — G8911 Acute pain due to trauma: Secondary | ICD-10-CM | POA: Diagnosis not present

## 2020-11-09 DIAGNOSIS — M329 Systemic lupus erythematosus, unspecified: Secondary | ICD-10-CM | POA: Diagnosis not present

## 2020-11-10 DIAGNOSIS — I10 Essential (primary) hypertension: Secondary | ICD-10-CM | POA: Diagnosis not present

## 2020-11-10 DIAGNOSIS — W109XXD Fall (on) (from) unspecified stairs and steps, subsequent encounter: Secondary | ICD-10-CM | POA: Diagnosis not present

## 2020-11-10 DIAGNOSIS — S7002XD Contusion of left hip, subsequent encounter: Secondary | ICD-10-CM | POA: Diagnosis not present

## 2020-11-10 DIAGNOSIS — M329 Systemic lupus erythematosus, unspecified: Secondary | ICD-10-CM | POA: Diagnosis not present

## 2020-11-10 DIAGNOSIS — G8911 Acute pain due to trauma: Secondary | ICD-10-CM | POA: Diagnosis not present

## 2020-11-10 DIAGNOSIS — I160 Hypertensive urgency: Secondary | ICD-10-CM | POA: Diagnosis not present

## 2020-11-10 NOTE — Telephone Encounter (Signed)
Transition Care Management Follow-up Telephone Call  Date of discharge and from where: 11/05/20 @ [facility]  How have you been since you were released from the hospital? She is fine, just sore  Any questions or concerns? No  Items Reviewed:  Did the pt receive and understand the discharge instructions provided? Yes   Medications obtained and verified? Yes   Other? No   Any new allergies since your discharge? No   Dietary orders reviewed? Yes  Do you have support at home? Yes   Home Care and Equipment/Supplies: Were home health services ordered? yes If so, what is the name of the agency? [facility]  Has the agency set up a time to come to the patient's home? yes Were any new equipment or medical supplies ordered?  No  Functional Questionnaire: (I = Independent and D = Dependent) ADLs: I  Bathing/Dressing- I  Meal Prep- I  Eating- I  Maintaining continence- I  Transferring/Ambulation- I  Managing Meds- I  Follow up appointments reviewed:   PCP Hospital f/u appt confirmed? Yes  Scheduled to see Shelah Lewandowsky on 11/17/20 @ 11:30.  [facility] f/u appt confirmed? Yes  Scheduled to see Aline Brochure on 11/25/20 @ 1:30.  Are transportation arrangements needed? No   If their condition worsens, is the pt aware to call PCP or go to the Emergency Dept.? Yes  Was the patient provided with contact information for the PCP's office or ED? Yes  Was to pt encouraged to call back with questions or concerns? Yes

## 2020-11-12 DIAGNOSIS — M329 Systemic lupus erythematosus, unspecified: Secondary | ICD-10-CM | POA: Diagnosis not present

## 2020-11-12 DIAGNOSIS — G8911 Acute pain due to trauma: Secondary | ICD-10-CM | POA: Diagnosis not present

## 2020-11-12 DIAGNOSIS — W109XXD Fall (on) (from) unspecified stairs and steps, subsequent encounter: Secondary | ICD-10-CM | POA: Diagnosis not present

## 2020-11-12 DIAGNOSIS — I160 Hypertensive urgency: Secondary | ICD-10-CM | POA: Diagnosis not present

## 2020-11-12 DIAGNOSIS — S7002XD Contusion of left hip, subsequent encounter: Secondary | ICD-10-CM | POA: Diagnosis not present

## 2020-11-12 DIAGNOSIS — I10 Essential (primary) hypertension: Secondary | ICD-10-CM | POA: Diagnosis not present

## 2020-11-16 DIAGNOSIS — I10 Essential (primary) hypertension: Secondary | ICD-10-CM | POA: Diagnosis not present

## 2020-11-16 DIAGNOSIS — M329 Systemic lupus erythematosus, unspecified: Secondary | ICD-10-CM | POA: Diagnosis not present

## 2020-11-16 DIAGNOSIS — I160 Hypertensive urgency: Secondary | ICD-10-CM | POA: Diagnosis not present

## 2020-11-16 DIAGNOSIS — W109XXD Fall (on) (from) unspecified stairs and steps, subsequent encounter: Secondary | ICD-10-CM | POA: Diagnosis not present

## 2020-11-16 DIAGNOSIS — G8911 Acute pain due to trauma: Secondary | ICD-10-CM | POA: Diagnosis not present

## 2020-11-16 DIAGNOSIS — S7002XD Contusion of left hip, subsequent encounter: Secondary | ICD-10-CM | POA: Diagnosis not present

## 2020-11-17 ENCOUNTER — Inpatient Hospital Stay: Payer: Medicare Other | Admitting: Nurse Practitioner

## 2020-11-18 ENCOUNTER — Encounter: Payer: Self-pay | Admitting: Nurse Practitioner

## 2020-11-19 DIAGNOSIS — S7002XD Contusion of left hip, subsequent encounter: Secondary | ICD-10-CM | POA: Diagnosis not present

## 2020-11-19 DIAGNOSIS — M329 Systemic lupus erythematosus, unspecified: Secondary | ICD-10-CM | POA: Diagnosis not present

## 2020-11-19 DIAGNOSIS — I160 Hypertensive urgency: Secondary | ICD-10-CM | POA: Diagnosis not present

## 2020-11-19 DIAGNOSIS — I10 Essential (primary) hypertension: Secondary | ICD-10-CM | POA: Diagnosis not present

## 2020-11-19 DIAGNOSIS — G8911 Acute pain due to trauma: Secondary | ICD-10-CM | POA: Diagnosis not present

## 2020-11-19 DIAGNOSIS — W109XXD Fall (on) (from) unspecified stairs and steps, subsequent encounter: Secondary | ICD-10-CM | POA: Diagnosis not present

## 2020-11-24 DIAGNOSIS — I10 Essential (primary) hypertension: Secondary | ICD-10-CM | POA: Diagnosis not present

## 2020-11-24 DIAGNOSIS — S7002XD Contusion of left hip, subsequent encounter: Secondary | ICD-10-CM | POA: Diagnosis not present

## 2020-11-24 DIAGNOSIS — G8911 Acute pain due to trauma: Secondary | ICD-10-CM | POA: Diagnosis not present

## 2020-11-24 DIAGNOSIS — W109XXD Fall (on) (from) unspecified stairs and steps, subsequent encounter: Secondary | ICD-10-CM | POA: Diagnosis not present

## 2020-11-24 DIAGNOSIS — I160 Hypertensive urgency: Secondary | ICD-10-CM | POA: Diagnosis not present

## 2020-11-24 DIAGNOSIS — M329 Systemic lupus erythematosus, unspecified: Secondary | ICD-10-CM | POA: Diagnosis not present

## 2020-11-25 ENCOUNTER — Ambulatory Visit (INDEPENDENT_AMBULATORY_CARE_PROVIDER_SITE_OTHER): Payer: Medicare Other | Admitting: Orthopedic Surgery

## 2020-11-25 ENCOUNTER — Encounter: Payer: Self-pay | Admitting: Orthopedic Surgery

## 2020-11-25 ENCOUNTER — Other Ambulatory Visit: Payer: Self-pay

## 2020-11-25 VITALS — BP 193/108 | HR 87 | Ht 64.5 in | Wt 123.0 lb

## 2020-11-25 DIAGNOSIS — S7002XD Contusion of left hip, subsequent encounter: Secondary | ICD-10-CM | POA: Diagnosis not present

## 2020-11-25 NOTE — Progress Notes (Signed)
Chief Complaint  Patient presents with  . 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  . Hematoma of left hip, subsequent encounter Yes

## 2020-12-07 ENCOUNTER — Encounter: Payer: Self-pay | Admitting: Nurse Practitioner

## 2020-12-07 ENCOUNTER — Ambulatory Visit (INDEPENDENT_AMBULATORY_CARE_PROVIDER_SITE_OTHER): Payer: Medicare Other

## 2020-12-07 ENCOUNTER — Ambulatory Visit (INDEPENDENT_AMBULATORY_CARE_PROVIDER_SITE_OTHER): Payer: Medicare Other | Admitting: Nurse Practitioner

## 2020-12-07 ENCOUNTER — Other Ambulatory Visit: Payer: Self-pay

## 2020-12-07 VITALS — BP 152/88 | HR 81 | Temp 97.2°F | Resp 20 | Ht 64.0 in | Wt 127.0 lb

## 2020-12-07 DIAGNOSIS — K5900 Constipation, unspecified: Secondary | ICD-10-CM | POA: Diagnosis not present

## 2020-12-07 NOTE — Progress Notes (Signed)
   Subjective:    Patient ID: JULIANNE CHAMBERLIN, female    DOB: 09-01-1945, 75 y.o.   MRN: [id number]   Chief Complaint: Unable to have a bowel movement   HPI Patient comes in [place] stating she is having trouble having a bowel movement. She has not had a bowel movement in 3 days. She has been taking OTC laxative with gassiness but no other results. She is on no new meds and routine has not changed.   Review of Systems  Constitutional: Negative for diaphoresis.  Eyes: Negative for pain.  Respiratory: Negative for shortness of breath.   Cardiovascular: Negative for chest pain, palpitations and leg swelling.  Gastrointestinal: Positive for constipation. Negative for abdominal pain.  Endocrine: Negative for polydipsia.  Skin: Negative for rash.  Neurological: Negative for dizziness, weakness and headaches.  Hematological: Does not bruise/bleed easily.  All other systems reviewed and are negative.      Objective:   Physical Exam Vitals and nursing note reviewed.  Constitutional:      Appearance: Normal appearance.  Cardiovascular:     Rate and Rhythm: Normal rate and regular rhythm.     Heart sounds: Normal heart sounds.  Pulmonary:     Effort: Pulmonary effort is normal.     Breath sounds: Normal breath sounds.  Abdominal:     General: Abdomen is flat. Bowel sounds are normal.     Palpations: Abdomen is soft.  Skin:    General: Skin is warm.  Neurological:     General: No focal deficit present.     Mental Status: She is alert and oriented to person, place, and time.  Psychiatric:        Mood and Affect: Mood normal.        Behavior: Behavior normal.     BP (!) 152/88   Pulse 81   Temp (!) 97.2 F (36.2 C) (Temporal)   Resp 20   Ht 5\' 4"  (1.626 m)   Wt 127 lb (57.6 kg)   BMI 21.80 kg/m    KUB- excessive stool burden thoughout colon.- Preliminary reading by Ronnald Collum, FNP  [facility]      Assessment & Plan:  [name] in today with chief complaint of Unable  to have a bowel movement   1. Constipation, unspecified constipation type Milk of magnesia nad 8 oz of prune juice- may repeat in 6 hours if no results Can do enema if doesn't want to do MOM and prune juice Force fluids Increase fiber in diet miralax in apple juice 3x a week.  - DG Abd 1 View    The above assessment and management plan was discussed with the patient. The patient verbalized understanding of and has agreed to the management plan. Patient is aware to call the clinic if symptoms persist or worsen. Patient is aware when to return to the clinic for a follow-up visit. Patient educated on when it is appropriate to go to the emergency department.   [name] Done, FNP

## 2020-12-07 NOTE — Patient Instructions (Signed)
Constipation, Adult Constipation is when a person has trouble pooping (having a bowel movement). When you have this condition, you may poop fewer than 3 times a week. Your poop (stool) may also be dry, hard, or bigger than normal. Follow these instructions at home: Eating and drinking  Eat foods that have a lot of fiber, such as: ? Fresh fruits and vegetables. ? Whole grains. ? Beans.  Eat less of foods that are low in fiber and high in fat and sugar, such as: ? French fries. ? Hamburgers. ? Cookies. ? Candy. ? Soda.  Drink enough fluid to keep your pee (urine) pale yellow.   General instructions  Exercise regularly or as told by your doctor. Try to do 150 minutes of exercise each week.  Go to the restroom when you feel like you need to poop. Do not hold it in.  Take over-the-counter and prescription medicines only as told by your doctor. These include any fiber supplements.  When you poop: ? Do deep breathing while relaxing your lower belly (abdomen). ? Relax your pelvic floor. The pelvic floor is a group of muscles that support the rectum, bladder, and intestines (as well as the uterus in women).  Watch your condition for any changes. Tell your doctor if you notice any.  Keep all follow-up visits as told by your doctor. This is important. Contact a doctor if:  You have pain that gets worse.  You have a fever.  You have not pooped for 4 days.  You vomit.  You are not hungry.  You lose weight.  You are bleeding from the opening of the butt (anus).  You have thin, pencil-like poop. Get help right away if:  You have a fever, and your symptoms suddenly get worse.  You leak poop or have blood in your poop.  Your belly feels hard or bigger than normal (bloated).  You have very bad belly pain.  You feel dizzy or you faint. Summary  Constipation is when a person poops fewer than 3 times a week, has trouble pooping, or has poop that is dry, hard, or bigger than  normal.  Eat foods that have a lot of fiber.  Drink enough fluid to keep your pee (urine) pale yellow.  Take over-the-counter and prescription medicines only as told by your doctor. These include any fiber supplements. This information is not intended to replace advice given to you by your health care provider. Make sure you discuss any questions you have with your health care provider. Document Revised: 06/04/2019 Document Reviewed: 06/04/2019 Elsevier Patient Education  2021 Elsevier Inc.  

## 2020-12-10 ENCOUNTER — Encounter: Payer: Self-pay | Admitting: Nurse Practitioner

## 2020-12-10 ENCOUNTER — Other Ambulatory Visit: Payer: Self-pay

## 2020-12-10 ENCOUNTER — Ambulatory Visit (INDEPENDENT_AMBULATORY_CARE_PROVIDER_SITE_OTHER): Payer: Medicare Other | Admitting: Nurse Practitioner

## 2020-12-10 VITALS — BP 153/92 | HR 84 | Temp 98.0°F | Resp 20 | Ht 64.0 in | Wt 127.0 lb

## 2020-12-10 DIAGNOSIS — M41116 Juvenile idiopathic scoliosis, lumbar region: Secondary | ICD-10-CM

## 2020-12-10 NOTE — Patient Instructions (Signed)
Scoliosis  Scoliosis is a condition in which the spine curves sideways. Normally, the spine does not curve side-to-side (laterally). With scoliosis, the spine may curve to the left, to the right, or in both directions. The curve of the spine is measured by angles in degrees. Scoliosis can affect people at any age, but it is more common among children and adolescents. What are the causes? The cause of scoliosis is not always known. It may be caused by:  A birth defect.  A disease that can cause problems in the muscles or imbalance of the body, such as cerebral palsy or muscular dystrophy. What are the signs or symptoms? This condition may not cause any symptoms. If you do have symptoms, they may include:  Leaning to one side.  Sunken chest and uneven shoulders.  One side of the body being different or larger than the other side (asymmetry).  An abnormal curve in the back.  Pain, which may limit physical activity.  Shortness of breath.  Bowel or bladder control problems, such as not knowing when you have to go. This can be a sign of nerve damage.   How is this diagnosed? This condition is diagnosed based on:  Your medical history.  Your symptoms.  A physical exam. This may include: ? Examining your nerves, muscles, and reflexes (neurological exam). ? Testing the movement of your spine (range of motion study).  Imaging tests, such as: ? X-rays. ? MRI. How is this treated? Treatment for this condition depends on the severity of the symptoms. Treatment may include:  Observation to make sure that your scoliosis does not get worse (progress). You may need to have regular visits with your health care provider.  A back brace to prevent scoliosis from progressing. This may be needed during times of fast growth (growth spurts), such as during adolescence.  Medicine to help relieve pain.  Physical therapy.  Surgery.   Follow these instructions at home: If you have a  brace:  Wear the brace as told by your health care provider. Remove it only as told by your health care provider.  Loosen the brace if your fingers or toes tingle, become numb, or turn cold and blue.  Keep the brace clean.  If the brace is not waterproof: ? Do not let it get wet. ? Cover it with a watertight covering when you take a bath or a shower. General instructions  Take over-the-counter and prescription medicines only as told by your health care provider.  Donot drive or use heavy machinery while taking prescription pain medicine.  If physical therapy was prescribed, do exercises as instructed.  Before starting any new sports or physical activities, ask your health care provider whether they are safe for you.  Keep all follow-up visits as told by your health care provider. This is important. Contact a health care provider if you have:  Problems with your back brace, such as skin irritation or discomfort.  Back pain that does not get better with medicine. Get help right away if:  Your legs feel weak.  You cannot move your legs.  You cannot control when you urinate or pass stool (loss of bladder or bowel control). Summary  Scoliosis is a condition of having a spine that curves sideways. The spine may curve to the left, to the right, or in both directions.  This condition may be caused by birth defects or diseases that affect muscles and body balance.  Follow your health care provider's instructions about wearing  a brace, doing physical activities, and keeping follow-up visits. This information is not intended to replace advice given to you by your health care provider. Make sure you discuss any questions you have with your health care provider. Document Revised: 12/17/2017 Document Reviewed: 11/01/2017 Elsevier Patient Education  [place].

## 2020-12-10 NOTE — Progress Notes (Signed)
   Subjective:    Patient ID: Kimberly Roth, female    DOB: May 17, 1946, 75 y.o.   MRN: [id number]   Chief Complaint: Discuss scoliosis   HPI Patient comes in today to discuss her scoliosis. She was seen earlier this week with constipation. We did a KUB and showed scoliosis. The patient says looking at xrays that her scoliosis has worsened and she wants to know if there is anything we can do.    Review of Systems     Objective:   Physical Exam Vitals and nursing note reviewed.  Constitutional:      Appearance: Normal appearance.  Musculoskeletal:     Comments: Right hip lower the left Gait steady  Neurological:     Mental Status: She is alert.    BP (!) 153/92   Pulse 84   Temp 98 F (36.7 C) (Temporal)   Resp 20   Ht 5\' 4"  (1.626 m)   Wt 127 lb (57.6 kg)   BMI 21.80 kg/m         Assessment & Plan:  Kimberly Roth in today with chief complaint of Discuss scoliosis   1. Juvenile idiopathic scoliosis of lumbar region Nothing can be done at this point If develops back pain, let us know.    The above assessment and management plan was discussed with the patient. The patient verbalized understanding of and has agreed to the management plan. Patient is aware to call the clinic if symptoms persist or worsen. Patient is aware when to return to the clinic for a follow-up visit. Patient educated on when it is appropriate to go to the emergency department.   Kimberly Roth Done, FNP

## 2020-12-15 ENCOUNTER — Telehealth: Payer: Self-pay

## 2020-12-15 NOTE — Telephone Encounter (Signed)
I left a message for the patient to return my call in regards to scheduling mammogram

## 2020-12-16 ENCOUNTER — Ambulatory Visit: Payer: Medicare Other | Admitting: Nurse Practitioner

## 2020-12-30 ENCOUNTER — Ambulatory Visit (INDEPENDENT_AMBULATORY_CARE_PROVIDER_SITE_OTHER): Payer: Medicare Other

## 2020-12-30 ENCOUNTER — Other Ambulatory Visit: Payer: Self-pay

## 2020-12-30 DIAGNOSIS — I10 Essential (primary) hypertension: Secondary | ICD-10-CM

## 2020-12-30 DIAGNOSIS — G8911 Acute pain due to trauma: Secondary | ICD-10-CM

## 2020-12-30 DIAGNOSIS — C50912 Malignant neoplasm of unspecified site of left female breast: Secondary | ICD-10-CM

## 2020-12-30 DIAGNOSIS — S7002XD Contusion of left hip, subsequent encounter: Secondary | ICD-10-CM

## 2020-12-30 DIAGNOSIS — C50911 Malignant neoplasm of unspecified site of right female breast: Secondary | ICD-10-CM

## 2020-12-30 DIAGNOSIS — E039 Hypothyroidism, unspecified: Secondary | ICD-10-CM | POA: Diagnosis not present

## 2020-12-30 DIAGNOSIS — D539 Nutritional anemia, unspecified: Secondary | ICD-10-CM | POA: Diagnosis not present

## 2020-12-30 DIAGNOSIS — M329 Systemic lupus erythematosus, unspecified: Secondary | ICD-10-CM

## 2020-12-30 DIAGNOSIS — E785 Hyperlipidemia, unspecified: Secondary | ICD-10-CM

## 2020-12-30 DIAGNOSIS — E876 Hypokalemia: Secondary | ICD-10-CM | POA: Diagnosis not present

## 2020-12-30 DIAGNOSIS — Z9181 History of falling: Secondary | ICD-10-CM

## 2020-12-30 DIAGNOSIS — W109XXD Fall (on) (from) unspecified stairs and steps, subsequent encounter: Secondary | ICD-10-CM | POA: Diagnosis not present

## 2020-12-30 DIAGNOSIS — I160 Hypertensive urgency: Secondary | ICD-10-CM | POA: Diagnosis not present

## 2020-12-30 DIAGNOSIS — F419 Anxiety disorder, unspecified: Secondary | ICD-10-CM

## 2020-12-30 DIAGNOSIS — Z791 Long term (current) use of non-steroidal anti-inflammatories (NSAID): Secondary | ICD-10-CM

## 2020-12-30 DIAGNOSIS — Z8542 Personal history of malignant neoplasm of other parts of uterus: Secondary | ICD-10-CM

## 2020-12-30 DIAGNOSIS — F4310 Post-traumatic stress disorder, unspecified: Secondary | ICD-10-CM | POA: Diagnosis not present

## 2020-12-30 DIAGNOSIS — Z79891 Long term (current) use of opiate analgesic: Secondary | ICD-10-CM

## 2021-01-17 ENCOUNTER — Telehealth: Payer: Self-pay | Admitting: Nurse Practitioner

## 2021-01-17 NOTE — Telephone Encounter (Signed)
Patient states that she has a bad headache, congestion in her throat, and is dizzy. Scheduled televisit with MMM for 6/21 per patients request

## 2021-01-17 NOTE — Telephone Encounter (Signed)
Pt is having headaches, stuffiness and pain in face. Said she did not want an appt but wanted to have something called in. Would like to talk to [facility] nurse. Please call back and advise.

## 2021-01-18 ENCOUNTER — Encounter: Payer: Self-pay | Admitting: Nurse Practitioner

## 2021-01-18 ENCOUNTER — Ambulatory Visit (INDEPENDENT_AMBULATORY_CARE_PROVIDER_SITE_OTHER): Payer: Medicare Other | Admitting: Nurse Practitioner

## 2021-01-18 DIAGNOSIS — J0100 Acute maxillary sinusitis, unspecified: Secondary | ICD-10-CM | POA: Diagnosis not present

## 2021-01-18 MED ORDER — MECLIZINE HCL 25 MG PO TABS: 25.0000 mg | ORAL_TABLET | Freq: Three times a day (TID) | ORAL | 0 refills | Status: AC | PRN

## 2021-01-18 MED ORDER — CEPHALEXIN 500 MG PO CAPS: 500.0000 mg | ORAL_CAPSULE | Freq: Two times a day (BID) | ORAL | 0 refills | Status: AC

## 2021-01-18 NOTE — Progress Notes (Signed)
Virtual Visit  Note Due to COVID-19 pandemic this visit was conducted virtually. This visit type was conducted due to national recommendations for restrictions regarding the COVID-19 Pandemic (e.g. social distancing, sheltering in place) in an effort to limit this patient's exposure and mitigate transmission in our community. All issues noted in this document were discussed and addressed.  A physical exam was not performed with this format.  I connected with Kimberly Roth on 01/18/21 at 11:50 by telephone and verified that I am speaking with the correct person using two identifiers. Kimberly Roth is currently located at home and her husband is currently with her during visit. The provider, [name] Done, FNP is located in their office at time of visit.  I discussed the limitations, risks, security and privacy concerns of performing an evaluation and management service by telephone and the availability of in person appointments. I also discussed with the patient that there may be a patient responsible charge related to this service. The patient expressed understanding and agreed to proceed.   History and Present Illness:  Chief Complaint: Sinusitis   HPI Patient has been dizzy for 1 week. Has developed cough and congestion with lots of facial pressure. She has lots of PND. She denies fever.     Review of Systems  Constitutional:  Negative for chills and fever.  HENT:  Positive for congestion and sinus pain. Negative for sore throat.   Respiratory:  Negative for cough, sputum production and shortness of breath.   Neurological:  Positive for dizziness.    Observations/Objective: Alert and oriented- answers all questions appropriately No distress No cough Voice hoarse    Assessment and Plan: Kimberly Roth in today with chief complaint of Sinusitis   1. Acute non-recurrent maxillary sinusitis 1. Take meds as prescribed 2. Use a cool mist humidifier especially during  the winter months and when heat has been humid. 3. Use saline nose sprays frequently 4. Saline irrigations of the nose can be very helpful if done frequently.  * 4X daily for 1 week*  * Use of a nettie pot can be helpful with this. Follow directions with this* 5. Drink plenty of fluids 6. Keep thermostat turn down low 7.For any cough or congestion  Use plain Mucinex- regular strength or max strength is fine   * Children- consult with Pharmacist for dosing 8. For fever or aces or pains- take tylenol or ibuprofen appropriate for age and weight.  * for fevers greater than 101 orally you may alternate ibuprofen and tylenol every  3 hours.   Meds ordered this encounter  Medications   cephALEXin (KEFLEX) 500 MG capsule    Sig: Take 1 capsule (500 mg total) by mouth 2 (two) times daily.    Dispense:  14 capsule    Refill:  0    Order Specific Question:   Supervising Provider    Answer:   Caryl Pina A [[id number]]   meclizine (ANTIVERT) 25 MG tablet    Sig: Take 1 tablet (25 mg total) by mouth 3 (three) times daily as needed for dizziness.    Dispense:  30 tablet    Refill:  0    Order Specific Question:   Supervising Provider    Answer:   Caryl Pina A [[id number]]        Follow Up Instructions: prn    I discussed the assessment and treatment plan with the patient. The patient was provided an opportunity to ask questions and all were answered.  The patient agreed with the plan and demonstrated an understanding of the instructions.   The patient was advised to call back or seek an in-person evaluation if the symptoms worsen or if the condition fails to improve as anticipated.  The above assessment and management plan was discussed with the patient. The patient verbalized understanding of and has agreed to the management plan. Patient is aware to call the clinic if symptoms persist or worsen. Patient is aware when to return to the clinic for a follow-up visit. Patient educated  on when it is appropriate to go to the emergency department.   Time call ended:  12:02  I provided 12 minutes of  non face-to-face time during this encounter.    [name] Done, FNP

## 2021-02-04 ENCOUNTER — Other Ambulatory Visit: Payer: Self-pay | Admitting: Nurse Practitioner

## 2021-02-04 DIAGNOSIS — H04123 Dry eye syndrome of bilateral lacrimal glands: Secondary | ICD-10-CM | POA: Diagnosis not present

## 2021-02-04 DIAGNOSIS — H40033 Anatomical narrow angle, bilateral: Secondary | ICD-10-CM | POA: Diagnosis not present

## 2021-02-04 DIAGNOSIS — J0100 Acute maxillary sinusitis, unspecified: Secondary | ICD-10-CM

## 2021-03-02 ENCOUNTER — Other Ambulatory Visit: Payer: Self-pay | Admitting: Nurse Practitioner

## 2021-03-14 ENCOUNTER — Ambulatory Visit: Payer: Medicare Other | Admitting: Nurse Practitioner

## 2021-03-28 ENCOUNTER — Other Ambulatory Visit: Payer: Self-pay | Admitting: Family Medicine

## 2021-03-29 ENCOUNTER — Other Ambulatory Visit: Payer: Self-pay | Admitting: Nurse Practitioner

## 2021-04-30 IMAGING — DX DG FEMUR 2+V*L*
4 series · 4 of 4 positions shown · non-contrast
Comparison: None.

CLINICAL DATA: Fall, left leg pain

EXAM:
LEFT FEMUR 2 VIEWS

[femur ap proximal]
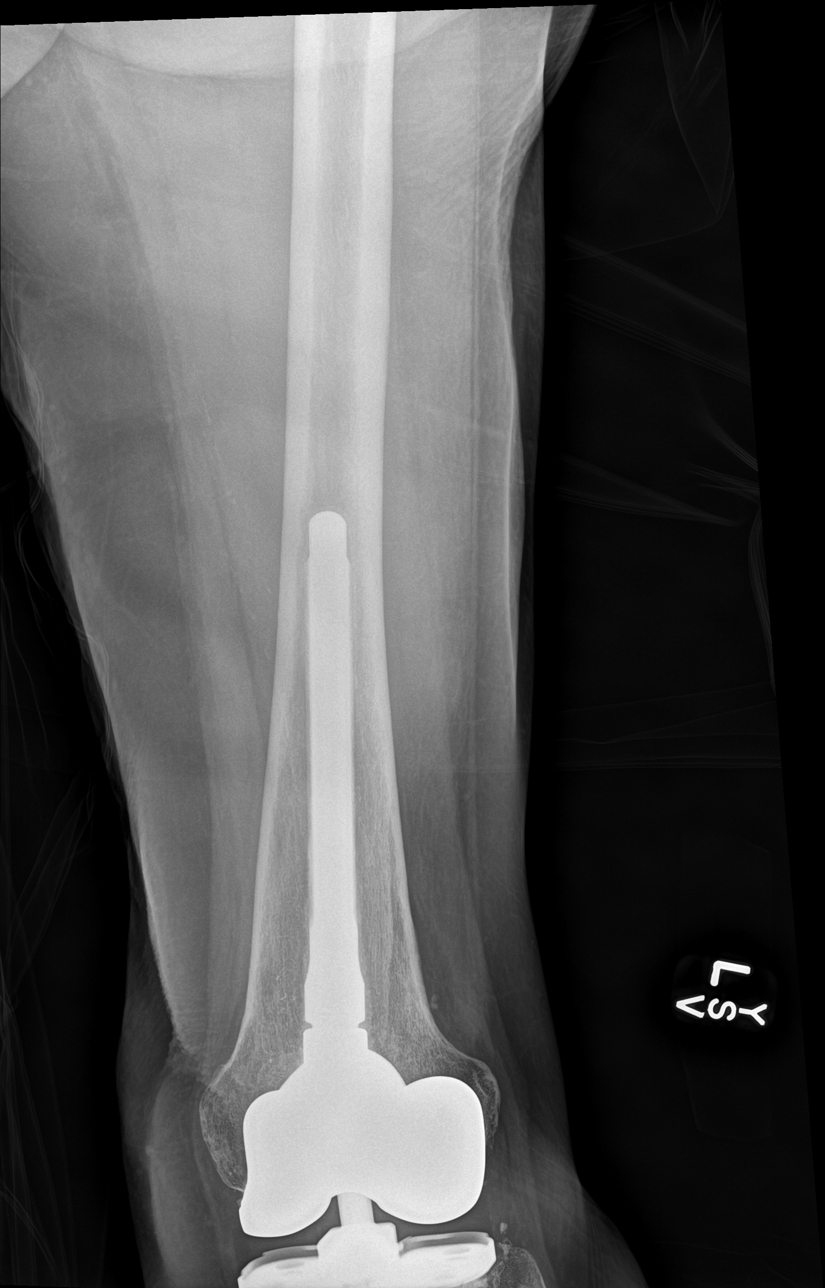

[hip ap]
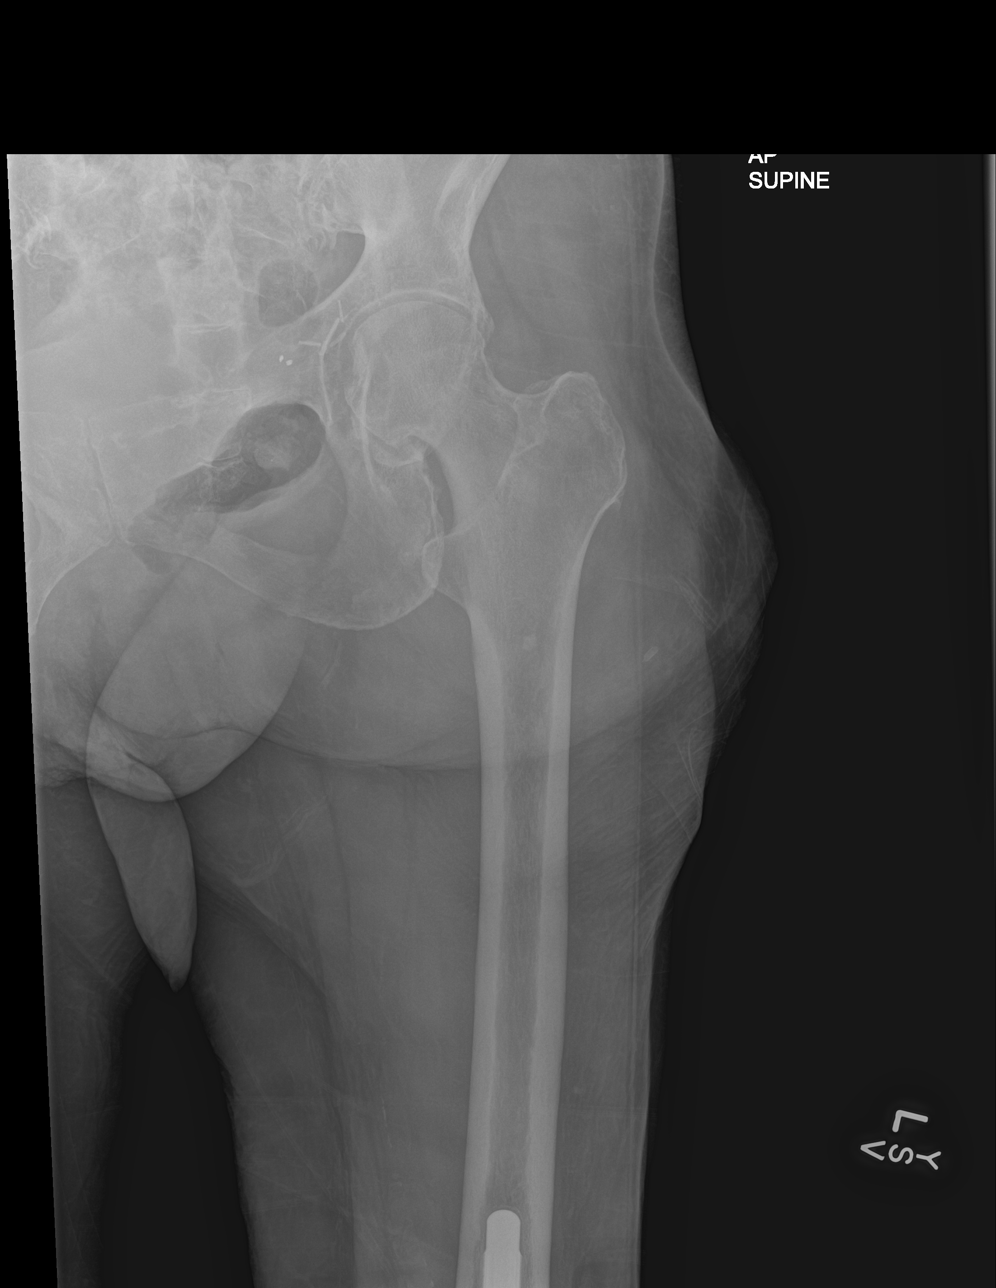

[femur lat distal]
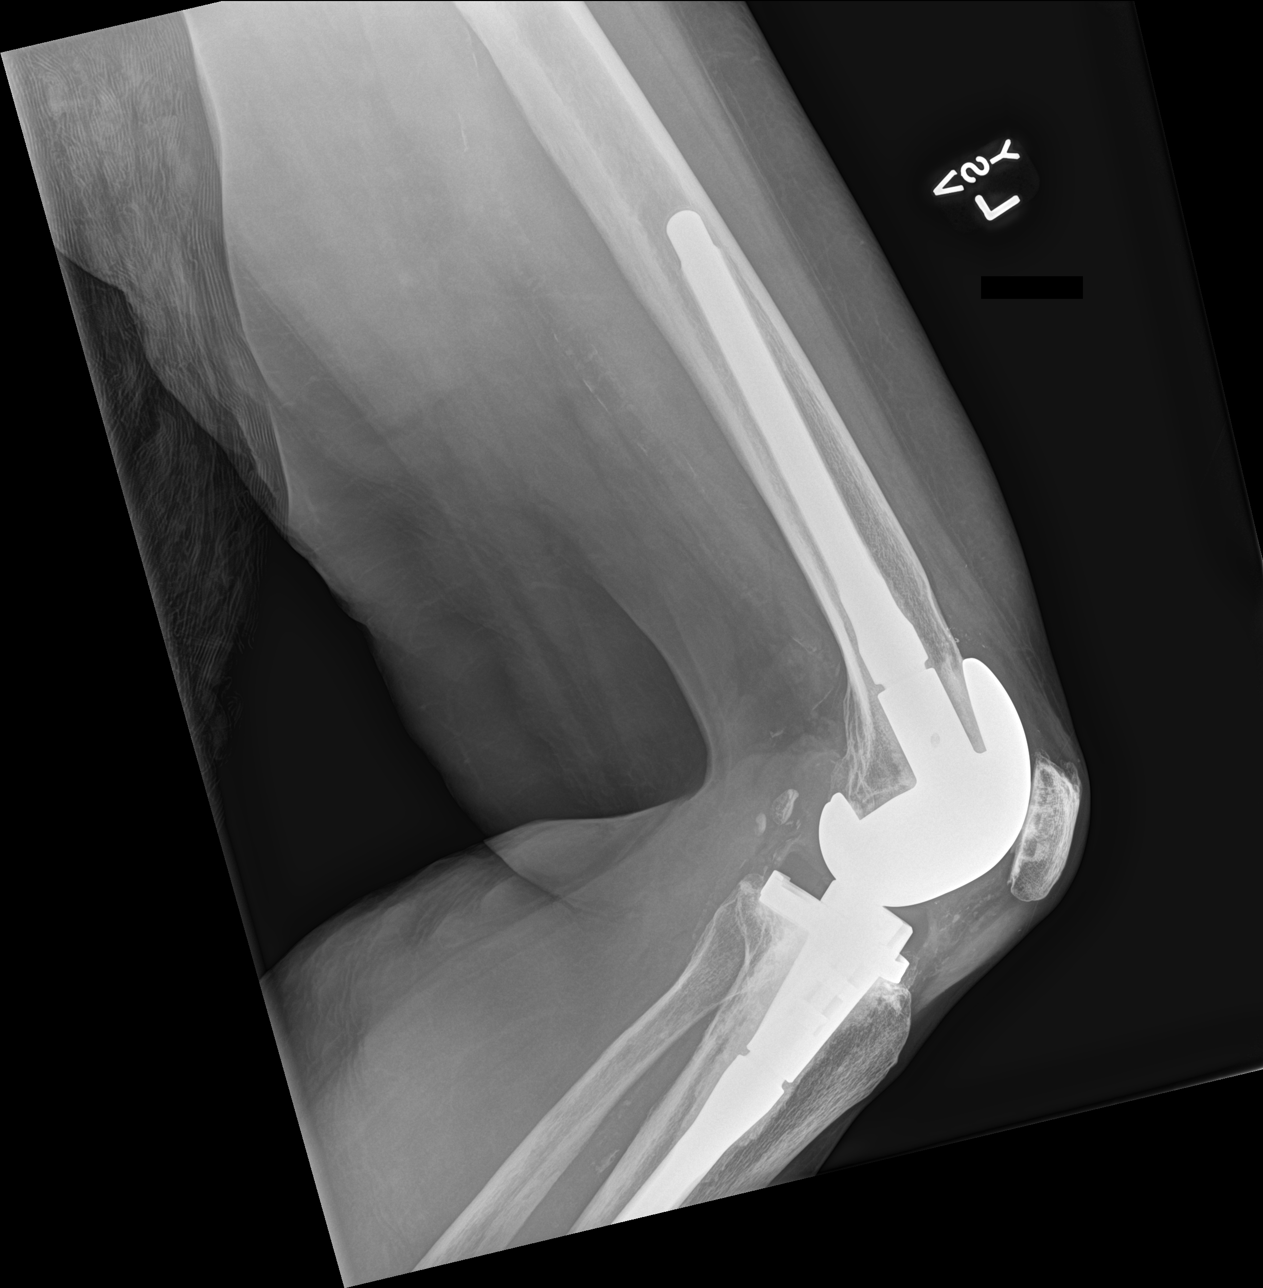

[hip lat]
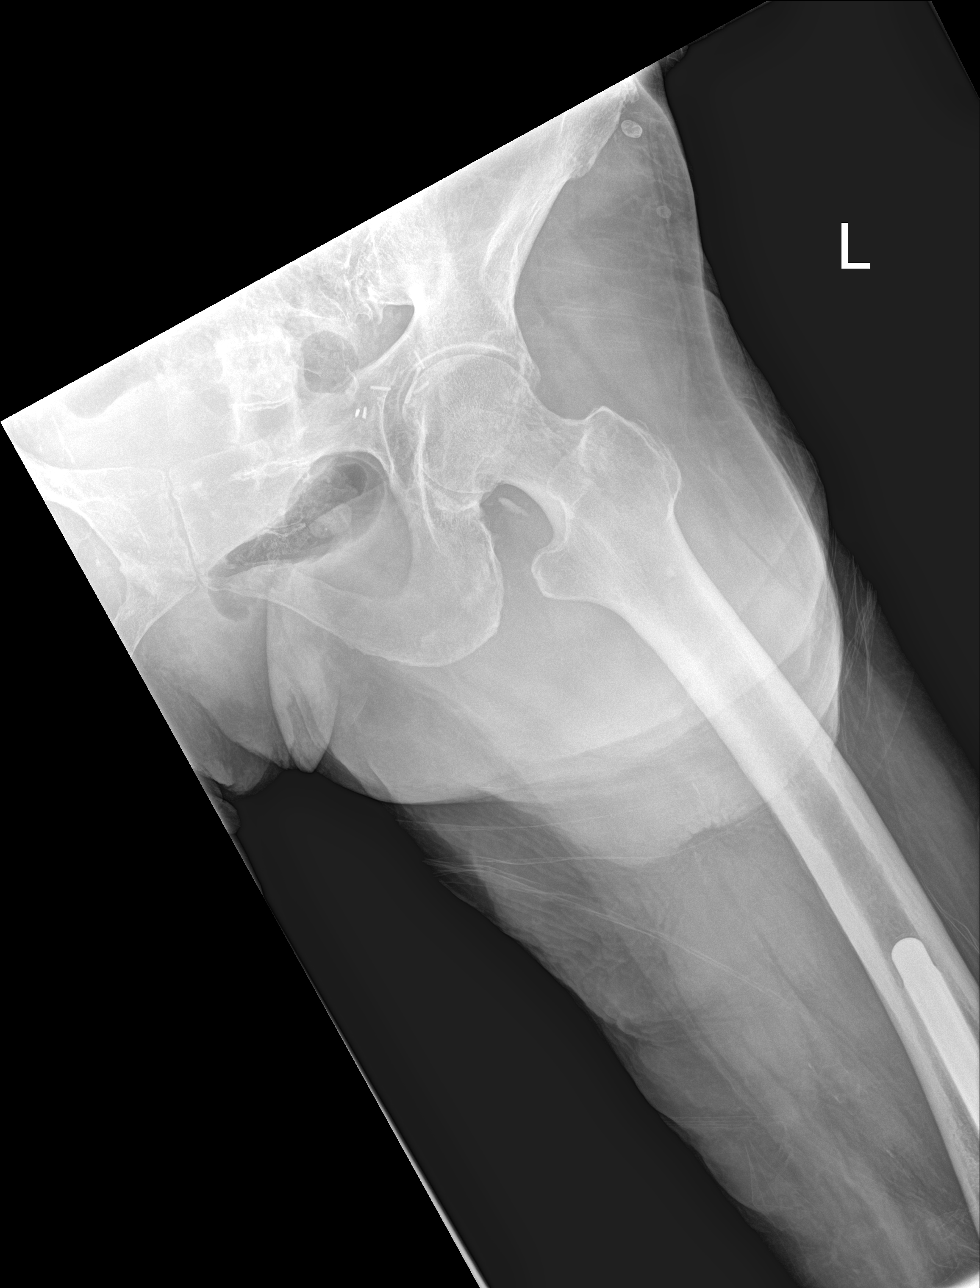

[4 of 4 positions shown; findings below may reference images not displayed]

FINDINGS: Changes of left knee replacement. No hardware complicating feature.
No acute bony abnormality. Specifically, no fracture, subluxation,
or dislocation.
IMPRESSION: Left knee replacement.

No acute bony abnormality.

## 2021-04-30 IMAGING — CT CT PELVIS W/O CM
2 of 3 series · 16 of 46 positions shown, 18 images · non-contrast
Comparison: 11/17/2019

CLINICAL DATA: Pain to the left hip and left lower extremity after
a fall. Left buttock pain and bruising.

EXAM:
CT PELVIS WITHOUT CONTRAST
TECHNIQUE: Multidetector CT imaging of the pelvis was performed following the
standard protocol without intravenous contrast.

[Series 5: 3 axial soft · axial · 0.66mm/px · z∈[-564,-348]mm · 13 of 126 slices shown, 15 images]
[im 9/126  soft-tissue]
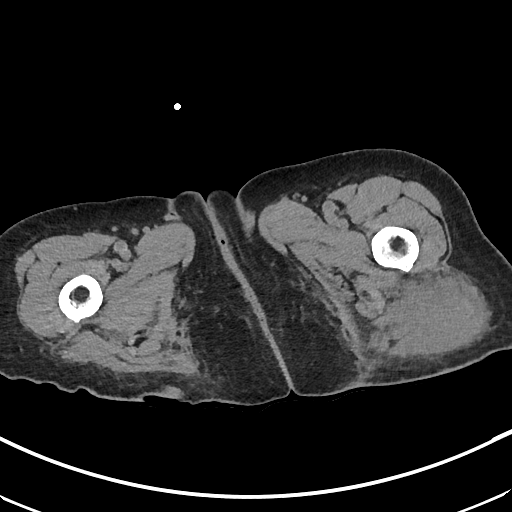
[im 9/126  bone]
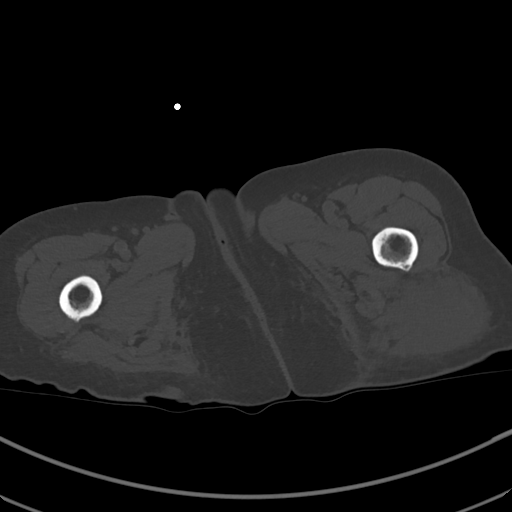
[im 17/126  soft-tissue]
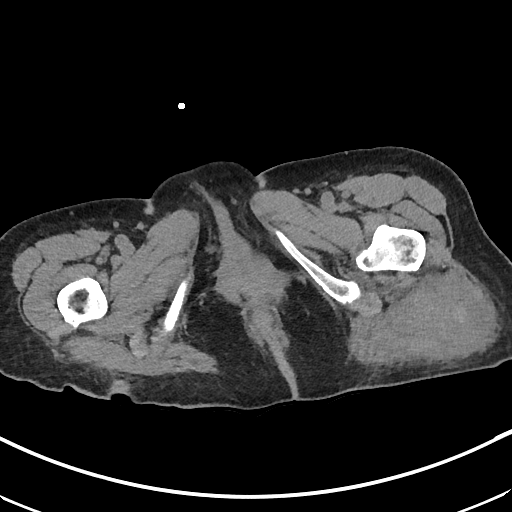
[im 25/126  soft-tissue]
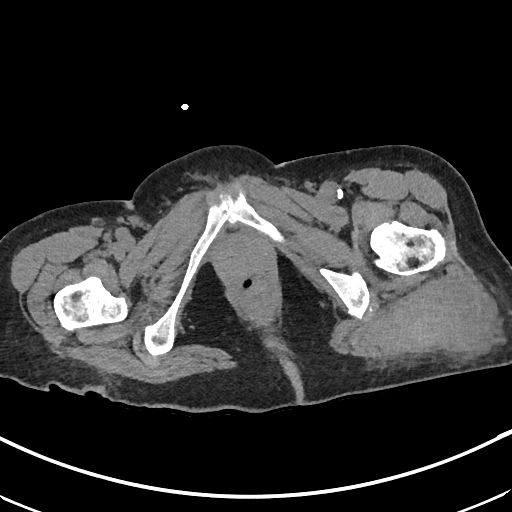
[im 37/126  soft-tissue]
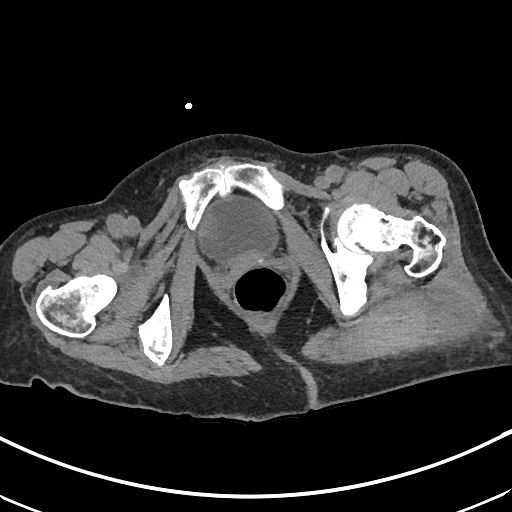
[im 45/126  soft-tissue]
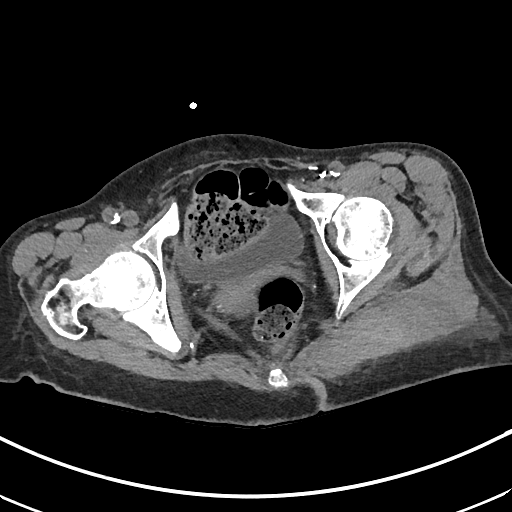
[im 53/126  soft-tissue]
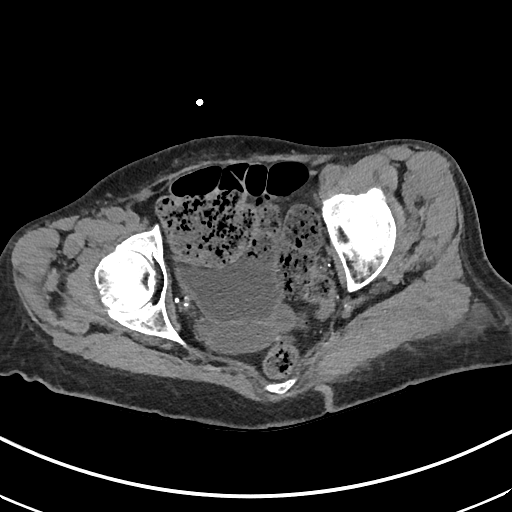
[im 65/126  soft-tissue]
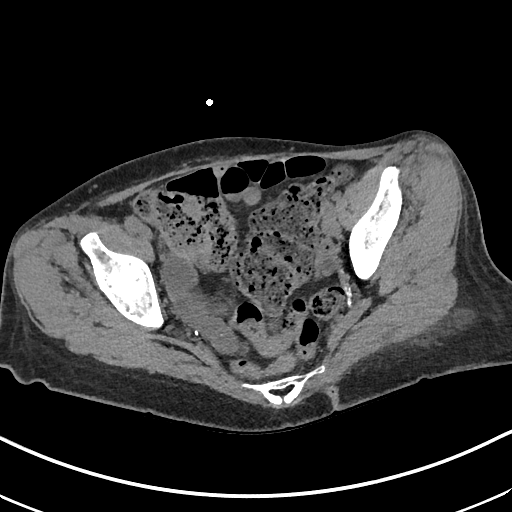
[im 73/126  soft-tissue]
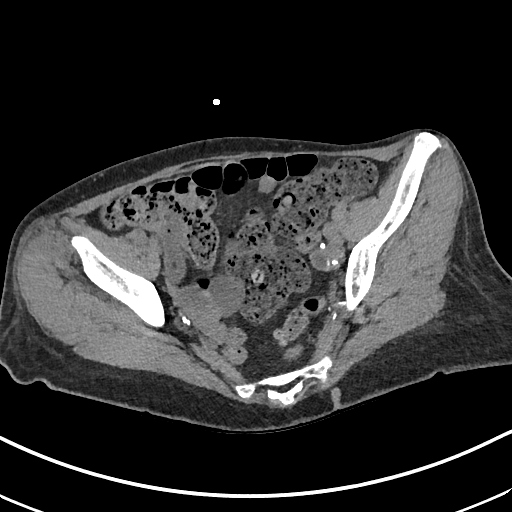
[im 81/126  soft-tissue]
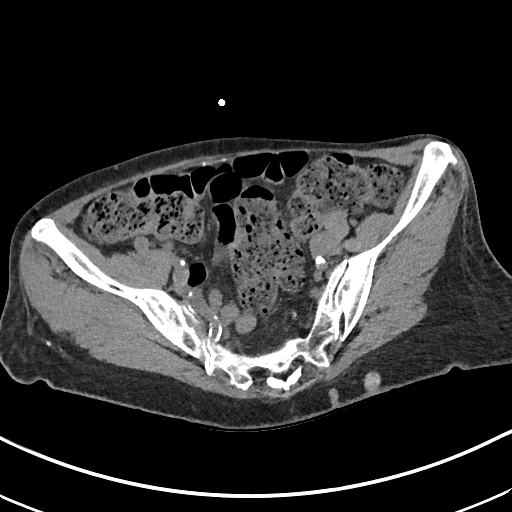
[im 81/126  bone]
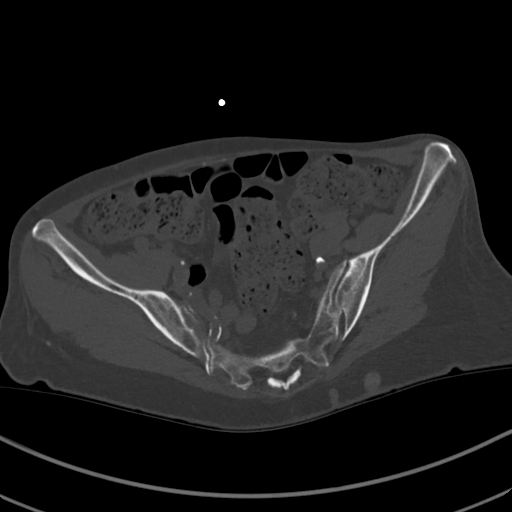
[im 89/126  soft-tissue]
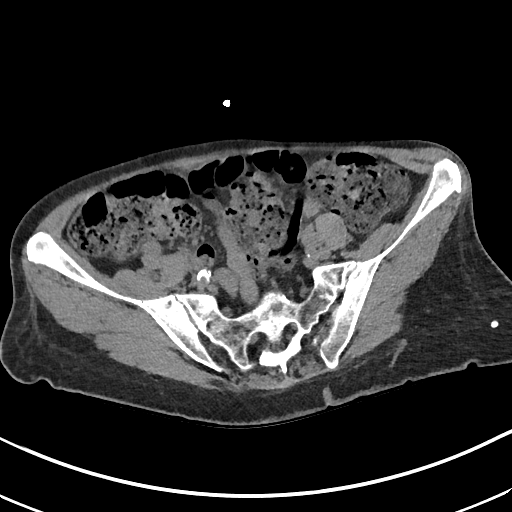
[im 101/126  soft-tissue]
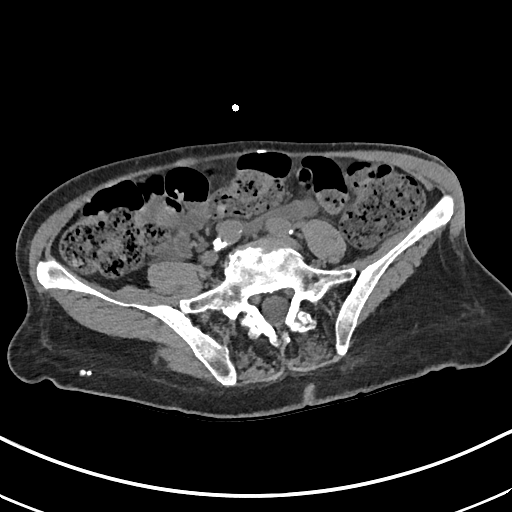
[im 109/126  soft-tissue]
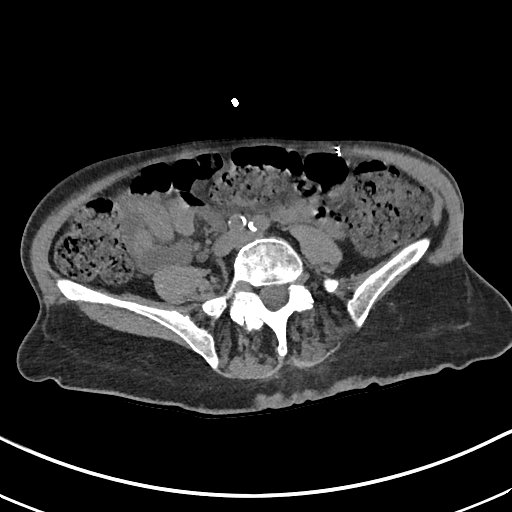
[im 117/126  soft-tissue]
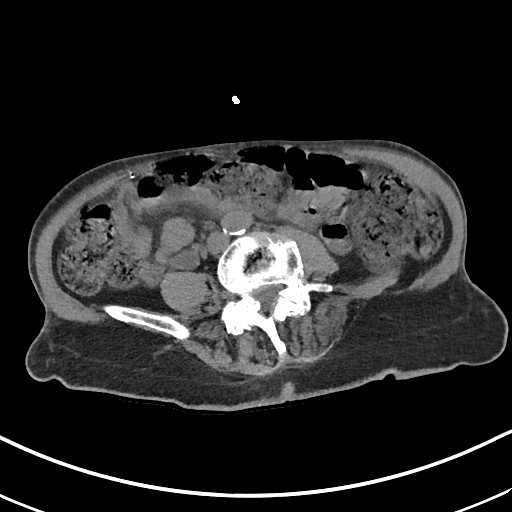

[Series 8: coronal st · coronal · 0.55mm/px · 3 of 101 slices shown]
[im 34/101  soft-tissue]
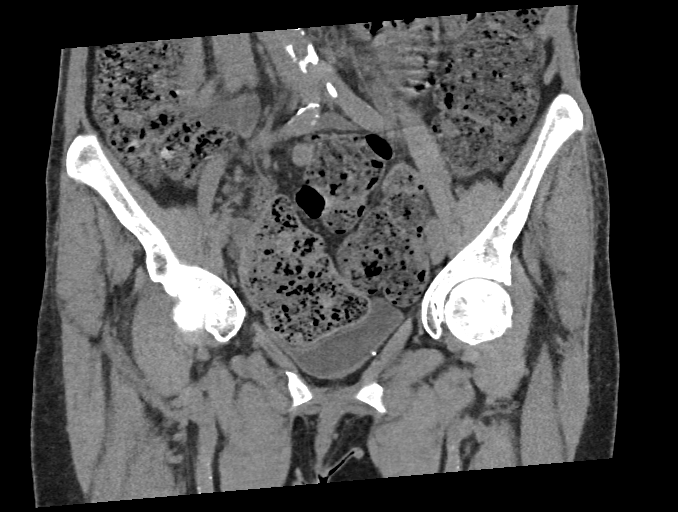
[im 45/101  soft-tissue]
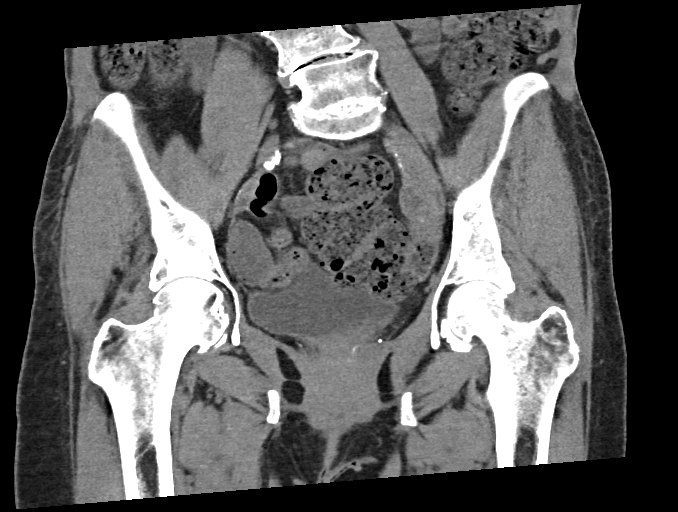
[im 56/101  soft-tissue]
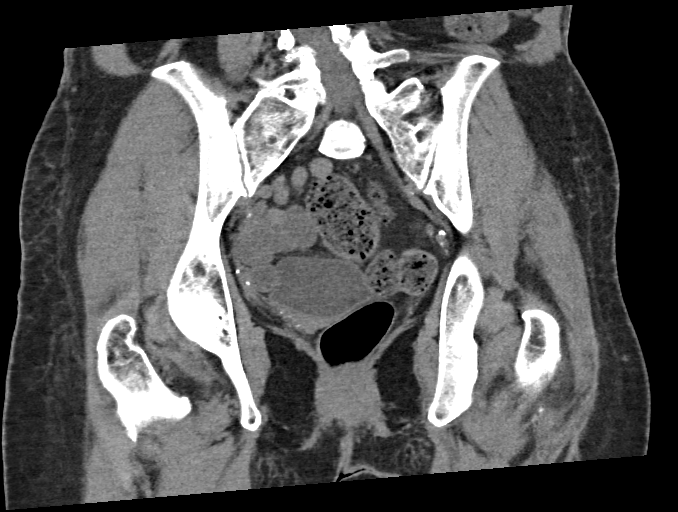

[16 of 46 positions shown; findings below may reference images not displayed]

FINDINGS: Urinary Tract: Bladder is normal. No wall thickening, stone, or
filling defect.

Bowel: Visualized colon is diffusely stool-filled. Visualized small
bowel are not abnormally distended. Appendix is not identified.

Vascular/Lymphatic: Iliofemoral arterial vascular calcifications.
Surgical clips in the left groin.

Reproductive: Uterus is surgically absent. No abnormal adnexal
masses.

Other:  No free air or free fluid in the pelvis.

Musculoskeletal: Degenerative changes demonstrated in the lower
lumbar spine and in both hips. No focal bone lesion or bone
destruction. Bone cortex appears intact. No acute displaced
fractures identified. No significant effusion or bursal collection.
Soft tissue infiltration in the subcutaneous fat over the left hip.
Large posterior hematoma demonstrated in the inferior left gluteus
maximus muscle posterior to the left hip, measuring 5.9 x 8.1 cm.
IMPRESSION: 1. Large hematoma in the inferior left gluteus maximus muscle
posterior to the left hip, measuring 5.9 x 8.1 cm.
2. Soft tissue infiltration in the subcutaneous fat over the left
hip consistent with contusion.
3. Degenerative changes in the lower lumbar spine and hips. No acute
fracture or dislocation.

## 2021-06-16 DIAGNOSIS — R1030 Lower abdominal pain, unspecified: Secondary | ICD-10-CM | POA: Diagnosis not present

## 2021-06-19 ENCOUNTER — Other Ambulatory Visit: Payer: Self-pay | Admitting: Nurse Practitioner

## 2021-06-19 DIAGNOSIS — E034 Atrophy of thyroid (acquired): Secondary | ICD-10-CM

## 2021-07-03 ENCOUNTER — Other Ambulatory Visit: Payer: Self-pay | Admitting: Nurse Practitioner

## 2021-07-04 NOTE — Telephone Encounter (Signed)
MMM NTBS 30 days given 03/30/21

## 2021-07-05 ENCOUNTER — Encounter: Payer: Self-pay | Admitting: Nurse Practitioner

## 2021-07-05 DIAGNOSIS — R1030 Lower abdominal pain, unspecified: Secondary | ICD-10-CM | POA: Diagnosis not present

## 2021-07-05 DIAGNOSIS — Z90710 Acquired absence of both cervix and uterus: Secondary | ICD-10-CM | POA: Diagnosis not present

## 2021-07-05 NOTE — Telephone Encounter (Signed)
LMTCB TO SCHEDULE APPT LETTER MAILED 

## 2021-07-06 ENCOUNTER — Other Ambulatory Visit: Payer: Self-pay | Admitting: Nurse Practitioner

## 2021-07-06 ENCOUNTER — Telehealth: Payer: Self-pay | Admitting: Nurse Practitioner

## 2021-07-06 MED ORDER — BUPROPION HCL ER (XL) 300 MG PO TB24: 300.0000 mg | ORAL_TABLET | Freq: Every day | ORAL | 0 refills | Status: AC

## 2021-07-06 NOTE — Telephone Encounter (Signed)
Refill sent to [facility] and husband notified

## 2021-07-11 ENCOUNTER — Ambulatory Visit: Payer: Medicare Other | Admitting: Nurse Practitioner

## 2021-07-15 ENCOUNTER — Ambulatory Visit: Payer: Medicare Other | Admitting: Nurse Practitioner

## 2021-07-15 NOTE — Progress Notes (Deleted)
Subjective:    Patient ID: Kimberly Roth, female    DOB: 1945/11/14, 75 y.o.   MRN: [id number]   Chief Complaint: medical management of chronic issues     HPI:  1. Pure hypercholesterolemia She does try to watch her diet. Stays active but does no dedicated exercise Lab Results  Component Value Date   CHOL 275 (H) 09/13/2020   HDL 87 09/13/2020   LDLCALC 176 (H) 09/13/2020   TRIG 74 09/13/2020   CHOLHDL 3.2 09/13/2020     2. Acquired hypothyroidism No problems that she is aware of. We decreased her levothyroxin dose at last visit and she was suppose to have labs repeated in 6-8 weeks but did not come in and have done. Lab Results  Component Value Date   TSH 0.021 (L) 09/13/2020     3. Intractable pain She has chronic neck pain that she takes diclofenac for  4. Systemic lupus erythematosus, unspecified SLE type, unspecified organ involvement status ([place]) ***  5. Recurrent major depressive disorder, in full remission ([place]) She is on resperdal and wellbutrin combination and is doing well.  6. GAD (generalized anxiety disorder) ***  7. insomnia Takes belsomra and that works pretty well for her   Outpatient Encounter Medications as of 07/15/2021  Medication Sig   acetaminophen (TYLENOL) 325 MG tablet Take 2 tablets (650 mg total) by mouth every 4 (four) hours as needed for mild pain.   Ascorbic Acid (VITAMIN C) 1000 MG tablet Take 1,000 mg by mouth 2 (two) times daily.   atorvastatin (LIPITOR) 40 MG tablet Take 1 tablet (40 mg total) by mouth daily.   BIOTIN PO Take 1 tablet by mouth 2 (two) times daily.   buPROPion (WELLBUTRIN XL) 300 MG 24 hr tablet Take 1 tablet (300 mg total) by mouth daily. (NEEDS TO BE SEEN BEFORE NEXT REFILL)   calcium-vitamin D (OSCAL WITH D) 250-125 MG-UNIT per tablet Take 1 tablet by mouth 2 (two) times daily.   cephALEXin (KEFLEX) 500 MG capsule Take 1 capsule (500 mg total) by mouth 2 (two) times daily.   cholecalciferol (VITAMIN D)  1000 UNITS tablet Take 1,000 Units by mouth 2 (two) times daily.   CRANBERRY EXTRACT PO Take 1 tablet by mouth 2 (two) times daily.   diclofenac (VOLTAREN) 75 MG EC tablet Take 1 tablet (75 mg total) by mouth 2 (two) times daily. With food   levothyroxine (SYNTHROID) 75 MCG tablet Take 1 tablet (75 mcg total) by mouth daily.   meclizine (ANTIVERT) 25 MG tablet Take 1 tablet (25 mg total) by mouth 3 (three) times daily as needed for dizziness.   Meclizine HCl 25 MG CHEW TAKE 1 TABLET BY MOUTH 3 TIMES DAILY AS NEEDED FOR DIZZINESS   Multiple Vitamins-Minerals (MULTIVITAMINS THER. W/MINERALS) TABS tablet Take 1 tablet by mouth 2 (two) times daily.   multivitamin-lutein (OCUVITE-LUTEIN) CAPS capsule Take 1 capsule by mouth 2 (two) times daily.   Omega-3 Fatty Acids (OMEGA 3 PO) Take 1 capsule by mouth 2 (two) times daily.   Potassium Gluconate 595 MG CAPS Take 1 capsule by mouth daily.   risperiDONE (RISPERDAL) 2 MG tablet TAKE ONE TABLET BY MOUTH AT BEDTIME   Suvorexant (BELSOMRA) 10 MG TABS Take 1 tablet by mouth daily.   Thiamine HCl (VITAMIN B-1) 250 MG tablet Take 250 mg by mouth 2 (two) times daily.   traMADol (ULTRAM) 50 MG tablet Take 1 tablet (50 mg total) by mouth every 6 (six) hours as needed for  moderate pain or severe pain.   No facility-administered encounter medications on file as of 07/15/2021.    Past Surgical History:  Procedure Laterality Date   ABDOMINAL HYSTERECTOMY  1976   no oopherectomy   APPENDECTOMY     BACK SURGERY     BREAST IMPLANT REMOVAL Bilateral    Silicone allergy   BREAST SURGERY Bilateral 1991   Mastectomy   CATARACT EXTRACTION W/PHACO Right 10/30/2013   Procedure: CATARACT EXTRACTION PHACO AND INTRAOCULAR LENS PLACEMENT ([place]);  Surgeon: Tonny Branch, MD;  Location: [facility];  Service: Ophthalmology;  Laterality: Right;  CDE:10.13   CATARACT EXTRACTION W/PHACO Left 11/10/2013   Procedure: CATARACT EXTRACTION PHACO AND INTRAOCULAR LENS PLACEMENT ([facility]);  Surgeon:  Tonny Branch, MD;  Location: [facility];  Service: Ophthalmology;  Laterality: Left;  CDE 9.71   DILATION AND CURETTAGE OF UTERUS     EYE SURGERY Bilateral 2015   Cataract - Dr Geoffry Paradise in [place] Left 2005   Knee x 2   MASTECTOMY Bilateral    PLACEMENT OF BREAST IMPLANTS Bilateral    Secondary to cancer   RECONSTRUCTION / CORRECTION OF NIPPLE / AEROLA Bilateral    TONSILLECTOMY     WISDOM TOOTH EXTRACTION Bilateral    Extraction x 4   WRIST SURGERY Right 20's   patient not exactly sure of reason    Family History  Problem Relation Age of Onset   Heart failure Father    Heart disease Father    Heart attack Father 37   Mental illness Mother    Mental illness Sister        PTSD   Osteoporosis Paternal Grandmother     New complaints: ***  Social history: ***  Controlled substance contract: ***     Review of Systems     Objective:   Physical Exam        Assessment & Plan:

## 2021-07-20 ENCOUNTER — Encounter: Payer: Self-pay | Admitting: Nurse Practitioner

## 2021-07-28 DIAGNOSIS — F411 Generalized anxiety disorder: Secondary | ICD-10-CM | POA: Diagnosis not present

## 2021-07-28 DIAGNOSIS — E039 Hypothyroidism, unspecified: Secondary | ICD-10-CM | POA: Diagnosis not present

## 2021-07-28 DIAGNOSIS — M542 Cervicalgia: Secondary | ICD-10-CM | POA: Diagnosis not present

## 2021-07-28 DIAGNOSIS — Z79899 Other long term (current) drug therapy: Secondary | ICD-10-CM | POA: Diagnosis not present

## 2021-08-10 ENCOUNTER — Telehealth: Payer: Self-pay | Admitting: Nurse Practitioner

## 2021-09-06 ENCOUNTER — Telehealth: Payer: Self-pay | Admitting: Nurse Practitioner

## 2021-09-06 NOTE — Telephone Encounter (Signed)
Left message for patient to call back and schedule Medicare Annual Wellness Visit (AWV) to be completed by video or phone.   Last AWV: 01/27/2016  Please schedule at anytime with [facility]  45 minute appointment  Any questions, please contact me at [phone]

## 2021-09-08 ENCOUNTER — Telehealth: Payer: Self-pay | Admitting: Nurse Practitioner

## 2021-09-27 ENCOUNTER — Telehealth: Payer: Self-pay | Admitting: Nurse Practitioner

## 2021-11-14 DIAGNOSIS — M25562 Pain in left knee: Secondary | ICD-10-CM | POA: Diagnosis not present

## 2021-11-15 DIAGNOSIS — M25562 Pain in left knee: Secondary | ICD-10-CM | POA: Diagnosis not present

## 2021-11-15 DIAGNOSIS — Z96652 Presence of left artificial knee joint: Secondary | ICD-10-CM | POA: Diagnosis not present

## 2021-11-29 ENCOUNTER — Telehealth: Payer: Self-pay | Admitting: Nurse Practitioner

## 2021-11-29 NOTE — Telephone Encounter (Signed)
Left message for patient to call back and schedule Medicare Annual Wellness Visit (AWV) to be completed by video or phone.  ? ?Last AWV: 01/27/2016 ? ?Please schedule at anytime with [facility] ? ?45 minute appointment ? ?Any questions, please contact me at [phone]  ?  ?  ?

## 2021-12-22 DIAGNOSIS — M25562 Pain in left knee: Secondary | ICD-10-CM | POA: Diagnosis not present

## 2021-12-22 DIAGNOSIS — F411 Generalized anxiety disorder: Secondary | ICD-10-CM | POA: Diagnosis not present

## 2021-12-22 DIAGNOSIS — M542 Cervicalgia: Secondary | ICD-10-CM | POA: Diagnosis not present

## 2021-12-29 ENCOUNTER — Encounter: Payer: Self-pay | Admitting: Orthopaedic Surgery

## 2021-12-29 ENCOUNTER — Ambulatory Visit (INDEPENDENT_AMBULATORY_CARE_PROVIDER_SITE_OTHER): Payer: Medicare Other | Admitting: Orthopaedic Surgery

## 2021-12-29 DIAGNOSIS — M7052 Other bursitis of knee, left knee: Secondary | ICD-10-CM

## 2021-12-29 DIAGNOSIS — Z96659 Presence of unspecified artificial knee joint: Secondary | ICD-10-CM

## 2021-12-29 NOTE — Progress Notes (Addendum)
Office Visit Note   Patient: Kimberly Roth           Date of Birth: [birth year]           MRN: [id number] Visit Date: 12/29/2021              Requested by: [name], [address],  [place] [address] PCP: [name], FNP   Assessment & Plan: Visit Diagnoses:  1. History of revision of total knee arthroplasty   2. Pes anserinus bursitis of left knee     Plan: 2 view x-rays left knee obtained and reviewed.  This shows well-positioned revision total knee arthroplasty without loosening or subsidence.  Medial tibial plateau region is normal.  Impression satisfactory left knee revision.  Patient has pes bursitis and if she decides she would like to have it injected if topical Voltaren gel which she has at home that she will apply 3-4 times a day over the area directly is not effective she can call for appointment to inject the pes bursa if she so desires.  Follow-Up Instructions: Return if symptoms worsen or fail to improve.   Orders:  Orders Placed This Encounter  Procedures   DG Knee 1-2 Views Left   No orders of the defined types were placed in this encounter.     Procedures: No procedures performed   Clinical Data: No additional findings.   Subjective: Chief Complaint  Patient presents with   Left Knee - Pain    It has been hurting for a while now. Most of the time it aches, when I walk a little that is when it lets me know it.    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  Originally had surgery by Dr. Case in [place] 15 years ago with total knee arthroplasty later revised.  She has had opposite right knee total knee which has done well without problems.  She has gradually had progressive increased pain anteriorly and was referred by Dr. Sherrie Sport for evaluation.  I previously seen her 2018 for shoulder arthroscopy and biceps tendon and  intra-articular tear with adhesive capsulitis and tenodesis performed.  Shoulder is doing well.  She ambulates but has increased discomfort with prolonged ambulation and at times limps.  Review of Systems positive for systemic lupus thyroid condition.  History of cervical spondylosis.  All systems noncontributory HPI.   Objective: Vital Signs: Ht 5' 4.25" (1.632 m)   Wt 133 lb 8 oz (60.6 kg)   BMI 22.74 kg/m   Physical Exam Constitutional:      Appearance: She is well-developed.  HENT:     Head: Normocephalic.     Right Ear: External ear normal.     Left Ear: External ear normal. There is no impacted cerumen.  Eyes:     Pupils: Pupils are equal, round, and reactive to light.  Neck:     Thyroid: No thyromegaly.     Trachea: No tracheal deviation.  Cardiovascular:     Rate and Rhythm: Normal rate.  Pulmonary:     Effort: Pulmonary effort is normal.  Abdominal:     Palpations: Abdomen is soft.  Musculoskeletal:     Cervical back: No rigidity.  Skin:    General: Skin is warm and dry.  Neurological:     Mental Status: She is alert and oriented to person, place, and time.  Psychiatric:  Behavior: Behavior normal.    Ortho Exam patient has tenderness anteriorly over the proximal tibia primarily over the pes bursa.  Collateral ligaments are balanced in extension and also flexion.  She has a little bit of AP laxity in 90 degree flexion.  Opposite right knee has a similar laxity in flexion.  Specialty Comments:  No specialty comments available.  Imaging: No results found.   PMFS History: Patient Active Problem List   Diagnosis Date Noted   History of revision of total knee arthroplasty 12/29/2021   Pes anserinus bursitis of left knee 12/29/2021   Intractable pain 11/03/2020   Neck pain 09/13/2020   Adrenal incidentaloma ([place]) 11/26/2019   Ventral hernia without obstruction or gangrene 11/17/2019   Traumatic partial tear of right biceps tendon 06/29/2016    Spondylosis of cervical region without myelopathy or radiculopathy 06/29/2016   Systemic lupus ([place]) 09/02/2014   Hyperlipidemia 09/02/2014   Hypothyroidism 04/09/2013   Depression 04/09/2013   GAD (generalized anxiety disorder) 04/09/2013   Past Medical History:  Diagnosis Date   Anxiety    Breast cancer ([place])    breast bilateral   Cataract    History of shingles    Hyperlipidemia    PTSD (post-traumatic stress disorder)    related to childhood trauma   Systemic lupus ([place])    Uterine cancer ([place])     Family History  Problem Relation Age of Onset   Heart failure Father    Heart disease Father    Heart attack Father 42   Mental illness Mother    Mental illness Sister        PTSD   Osteoporosis Paternal Grandmother     Past Surgical History:  Procedure Laterality Date   ABDOMINAL HYSTERECTOMY  1976   no oopherectomy   APPENDECTOMY     BACK SURGERY     BREAST IMPLANT REMOVAL Bilateral    Silicone allergy   BREAST SURGERY Bilateral 1991   Mastectomy   CATARACT EXTRACTION W/PHACO Right 10/30/2013   Procedure: CATARACT EXTRACTION PHACO AND INTRAOCULAR LENS PLACEMENT (IOC);  Surgeon: Tonny Branch, MD;  Location: [facility];  Service: Ophthalmology;  Laterality: Right;  CDE:10.13   CATARACT EXTRACTION W/PHACO Left 11/10/2013   Procedure: CATARACT EXTRACTION PHACO AND INTRAOCULAR LENS PLACEMENT (IOC);  Surgeon: Tonny Branch, MD;  Location: [facility];  Service: Ophthalmology;  Laterality: Left;  CDE 9.71   DILATION AND CURETTAGE OF UTERUS     EYE SURGERY Bilateral 2015   Cataract - Dr Geoffry Paradise in [place] Left 2005   Knee x 2   MASTECTOMY Bilateral    PLACEMENT OF BREAST IMPLANTS Bilateral    Secondary to cancer   RECONSTRUCTION / CORRECTION OF NIPPLE / AEROLA Bilateral    TONSILLECTOMY     WISDOM TOOTH EXTRACTION Bilateral    Extraction x 4   WRIST SURGERY Right 20's   patient not exactly sure of reason   Social History   Occupational History   Not on file   Tobacco Use   Smoking status: Never   Smokeless tobacco: Never  Vaping Use   Vaping Use: Never used  Substance and Sexual Activity   Alcohol use: Yes    Alcohol/week: 1.0 standard drink    Types: 1 Glasses of wine per week    Comment: 1 or 2 times per year   Drug use: No   Sexual activity: Yes

## 2022-01-04 ENCOUNTER — Emergency Department (HOSPITAL_COMMUNITY): Payer: Medicare Other

## 2022-01-04 ENCOUNTER — Other Ambulatory Visit: Payer: Self-pay

## 2022-01-04 ENCOUNTER — Encounter (HOSPITAL_COMMUNITY): Payer: Self-pay | Admitting: Emergency Medicine

## 2022-01-04 ENCOUNTER — Emergency Department (HOSPITAL_COMMUNITY)
Admission: EM | Admit: 2022-01-04 | Discharge: 2022-01-05 | Disposition: A | Discharge status: Home / Self Care | Payer: Medicare Other | Attending: Emergency Medicine | Admitting: Emergency Medicine

## 2022-01-04 DIAGNOSIS — I10 Essential (primary) hypertension: Secondary | ICD-10-CM | POA: Diagnosis not present

## 2022-01-04 DIAGNOSIS — R4182 Altered mental status, unspecified: Secondary | ICD-10-CM | POA: Diagnosis not present

## 2022-01-04 DIAGNOSIS — M549 Dorsalgia, unspecified: Secondary | ICD-10-CM | POA: Insufficient documentation

## 2022-01-04 DIAGNOSIS — F03911 Unspecified dementia, unspecified severity, with agitation: Secondary | ICD-10-CM | POA: Diagnosis not present

## 2022-01-04 DIAGNOSIS — R0689 Other abnormalities of breathing: Secondary | ICD-10-CM | POA: Insufficient documentation

## 2022-01-04 DIAGNOSIS — F329 Major depressive disorder, single episode, unspecified: Secondary | ICD-10-CM | POA: Insufficient documentation

## 2022-01-04 DIAGNOSIS — R410 Disorientation, unspecified: Secondary | ICD-10-CM | POA: Diagnosis present

## 2022-01-04 DIAGNOSIS — F03918 Unspecified dementia, unspecified severity, with other behavioral disturbance: Secondary | ICD-10-CM | POA: Insufficient documentation

## 2022-01-04 DIAGNOSIS — F29 Unspecified psychosis not due to a substance or known physiological condition: Secondary | ICD-10-CM | POA: Diagnosis not present

## 2022-01-04 DIAGNOSIS — Z20822 Contact with and (suspected) exposure to covid-19: Secondary | ICD-10-CM | POA: Diagnosis not present

## 2022-01-04 DIAGNOSIS — Z79899 Other long term (current) drug therapy: Secondary | ICD-10-CM | POA: Insufficient documentation

## 2022-01-04 DIAGNOSIS — R079 Chest pain, unspecified: Secondary | ICD-10-CM | POA: Diagnosis not present

## 2022-01-04 LAB — CBC WITH DIFFERENTIAL/PLATELET
Abs Immature Granulocytes: 0.03 10*3/uL (ref 0.00–0.07)
Basophils Absolute: 0 10*3/uL (ref 0.0–0.1)
Basophils Relative: 0 %
Eosinophils Absolute: 0 10*3/uL (ref 0.0–0.5)
Eosinophils Relative: 1 %
HCT: 35.4 % — ABNORMAL LOW (ref 36.0–46.0)
Hemoglobin: 11.8 g/dL — ABNORMAL LOW (ref 12.0–15.0)
Immature Granulocytes: 0 %
Lymphocytes Relative: 17 %
Lymphs Abs: 1.2 10*3/uL (ref 0.7–4.0)
MCH: 34 pg (ref 26.0–34.0)
MCHC: 33.3 g/dL (ref 30.0–36.0)
MCV: 102 fL — ABNORMAL HIGH (ref 80.0–100.0)
Monocytes Absolute: 0.9 10*3/uL (ref 0.1–1.0)
Monocytes Relative: 13 %
Neutro Abs: 4.9 10*3/uL (ref 1.7–7.7)
Neutrophils Relative %: 69 %
Platelets: 219 10*3/uL (ref 150–400)
RBC: 3.47 MIL/uL — ABNORMAL LOW (ref 3.87–5.11)
RDW: 13.3 % (ref 11.5–15.5)
WBC: 7.1 10*3/uL (ref 4.0–10.5)
nRBC: 0 % (ref 0.0–0.2)

## 2022-01-04 LAB — URINALYSIS, ROUTINE W REFLEX MICROSCOPIC
Bilirubin Urine: NEGATIVE
Glucose, UA: NEGATIVE mg/dL
Hgb urine dipstick: NEGATIVE
Ketones, ur: 20 mg/dL — AB
Nitrite: NEGATIVE
Protein, ur: NEGATIVE mg/dL
Specific Gravity, Urine: 1.01 (ref 1.005–1.030)
pH: 7 (ref 5.0–8.0)

## 2022-01-04 LAB — RESP PANEL BY RT-PCR (FLU A&B, COVID) ARPGX2
Influenza A by PCR: NEGATIVE
Influenza B by PCR: NEGATIVE
SARS Coronavirus 2 by RT PCR: NEGATIVE

## 2022-01-04 LAB — ETHANOL: Alcohol, Ethyl (B): <10 mg/dL (ref <10)

## 2022-01-04 LAB — BRAIN NATRIURETIC PEPTIDE: B Natriuretic Peptide: 125 pg/mL — ABNORMAL HIGH (ref 0.0–100.0)

## 2022-01-04 LAB — RAPID URINE DRUG SCREEN, HOSP PERFORMED
Amphetamines: NOT DETECTED
Barbiturates: NOT DETECTED
Benzodiazepines: POSITIVE — AB
Cocaine: NOT DETECTED
Opiates: NOT DETECTED
Tetrahydrocannabinol: NOT DETECTED

## 2022-01-04 LAB — TSH: TSH: 3.627 u[IU]/mL (ref 0.350–4.500)

## 2022-01-04 MED ORDER — CALCIUM CARBONATE-VITAMIN D 250-125 MG-UNIT PO TABS: 1.0000 | ORAL_TABLET | Freq: Two times a day (BID) | ORAL | Status: DC

## 2022-01-04 MED ORDER — THIAMINE HCL 100 MG PO TABS
250.0000 mg | ORAL_TABLET | Freq: Two times a day (BID) | ORAL | Status: DC
  Administered 2022-01-04 – 2022-01-05 (×2): 250 mg via ORAL
  Filled 2022-01-04 (×2): qty 3

## 2022-01-04 MED ORDER — LEVOTHYROXINE SODIUM 50 MCG PO TABS
75.0000 ug | ORAL_TABLET | Freq: Every day | ORAL | Status: DC
  Administered 2022-01-05: 75 ug via ORAL
  Filled 2022-01-04: qty 2

## 2022-01-04 MED ORDER — POTASSIUM GLUCONATE 595 MG PO CAPS: 1.0000 | ORAL_CAPSULE | Freq: Every day | ORAL | Status: DC

## 2022-01-04 MED ORDER — ATORVASTATIN CALCIUM 40 MG PO TABS
40.0000 mg | ORAL_TABLET | Freq: Every day | ORAL | Status: DC
  Administered 2022-01-04 – 2022-01-05 (×2): 40 mg via ORAL
  Filled 2022-01-04 (×2): qty 1

## 2022-01-04 MED ORDER — DICLOFENAC SODIUM 75 MG PO TBEC
75.0000 mg | DELAYED_RELEASE_TABLET | Freq: Two times a day (BID) | ORAL | Status: DC
  Administered 2022-01-05: 75 mg via ORAL
  Filled 2022-01-04 (×3): qty 1

## 2022-01-04 MED ORDER — POTASSIUM CHLORIDE CRYS ER 20 MEQ PO TBCR
10.0000 meq | EXTENDED_RELEASE_TABLET | Freq: Every day | ORAL | Status: DC
  Administered 2022-01-05: 10 meq via ORAL
  Filled 2022-01-04: qty 1

## 2022-01-04 MED ORDER — BUPROPION HCL ER (XL) 150 MG PO TB24: 300.0000 mg | ORAL_TABLET | Freq: Every day | ORAL | Status: DC

## 2022-01-04 MED ORDER — TRAMADOL HCL 50 MG PO TABS
50.0000 mg | ORAL_TABLET | Freq: Four times a day (QID) | ORAL | Status: DC | PRN
  Administered 2022-01-05: 50 mg via ORAL
  Filled 2022-01-04: qty 1

## 2022-01-04 MED ORDER — LEVOTHYROXINE SODIUM 50 MCG PO TABS: 75.0000 ug | ORAL_TABLET | Freq: Every day | ORAL | Status: DC

## 2022-01-04 MED ORDER — MECLIZINE HCL 12.5 MG PO TABS: 25.0000 mg | ORAL_TABLET | Freq: Three times a day (TID) | ORAL | Status: DC | PRN

## 2022-01-04 MED ORDER — BUPROPION HCL ER (XL) 150 MG PO TB24
300.0000 mg | ORAL_TABLET | Freq: Every day | ORAL | Status: DC
  Administered 2022-01-05: 300 mg via ORAL
  Filled 2022-01-04: qty 2

## 2022-01-04 MED ORDER — OYSTER SHELL CALCIUM/D3 500-5 MG-MCG PO TABS
1.0000 | ORAL_TABLET | Freq: Two times a day (BID) | ORAL | Status: DC
  Administered 2022-01-04 – 2022-01-05 (×2): 1 via ORAL
  Filled 2022-01-04 (×2): qty 1

## 2022-01-04 MED ORDER — ACETAMINOPHEN 325 MG PO TABS: 650.0000 mg | ORAL_TABLET | ORAL | Status: DC | PRN

## 2022-01-04 NOTE — Social Work (Signed)
CSW spoke to provider, Dr. Eulis Foster reports that the Kimberly Roth may need home health due to state of mind and Kimberly Roth husband is also not in the condition to fully care for her. Home health follow up by TOC.

## 2022-01-04 NOTE — ED Triage Notes (Signed)
Per the pt she's her for upper back pain, per husband she is her for AMS that started on June 5, per husband pt is taking to people who aren't there, make arrangement for things that don't exist.  Pt A x4 in triage.

## 2022-01-04 NOTE — ED Provider Triage Note (Signed)
Emergency Medicine Provider Triage Evaluation Note  Kimberly Roth , a 76 y.o. female  was evaluated in triage.  Pt complains of right upper back pain.  She reports pain has been present x6 months and secondary to a fall.  She states that she is fallen multiple times since then.  She saw orthopedic provider earlier this month and states that she had x-rays of her entire spine that were negative.  Per patient's spouse, she has been confused for some time gradually worsening this week.  Endorses low-grade fever at home.  Spouse states that she is talking to people that are not there and carrying on conversations when there is no one in the room.  He contacted her PCP earlier today and was advised to bring her here to the emergency department for a "mental evaluation."   Review of Systems  Positive: Right upper back pain, confusion, fever Negative: Headache, neck pain, numbness or weakness of her upper extremities  Physical Exam  BP (!) 175/72 (BP Location: Left Arm)   Pulse (!) 53   Temp 98.4 F (36.9 C) (Oral)   Resp 20   Ht '5\' 7"'$  (1.702 m)   Wt 59 kg   SpO2 98%   BMI 20.36 kg/m  Gen:   Awake, no distress   Resp:  Normal effort lungs clear to auscultation MSK:   Moves extremities without difficulty  Other:  Pain to palpation along the right scapular border.  Medical Decision Making  Medically screening exam initiated at 4:51 PM.  Appropriate orders placed.  Kimberly Roth was informed that the remainder of the evaluation will be completed by another provider, this initial triage assessment does not replace that evaluation, and the importance of remaining in the ED until their evaluation is complete.  Patient reports here for right upper back pain and frequent falls.  Patient's husband endorses increasing confusion and hallucinations with subjective fever. Patient will need further ER evaluation, she is agreeable to plan.   Kem Parkinson, PA-C 01/04/22 1721

## 2022-01-04 NOTE — ED Provider Notes (Signed)
[facility] EMERGENCY DEPARTMENT Provider Note   CSN: [id number] Arrival date & time: 01/04/22  1558     History  Chief Complaint  Patient presents with   Altered Mental Status    Kimberly Roth is a 76 y.o. female.  HPI Patient presenting with her husband for evaluation of confusion.  Patient reports only right upper back pain.  She thinks that it is from an old "tendon injury."  Husband states that for the last 2 days she has been confused, does not recall things that she can usually recall.  He has seen and heard her talking to people who were not there.  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  She is not employed currently.    Home Medications Prior to Admission medications   Medication Sig Start Date End Date Taking? Authorizing Provider  acetaminophen (TYLENOL) 325 MG tablet Take 2 tablets (650 mg total) by mouth every 4 (four) hours as needed for mild pain. 11/05/20   Hongalgi, Lenis Dickinson, MD  Ascorbic Acid (VITAMIN C) 1000 MG tablet Take 1,000 mg by mouth 2 (two) times daily.    [provider]  atorvastatin (LIPITOR) 40 MG tablet Take 1 tablet (40 mg total) by mouth daily. 09/13/20   Hassell Done, [name], FNP  BIOTIN PO Take 1 tablet by mouth 2 (two) times daily.    [provider]  buPROPion (WELLBUTRIN XL) 300 MG 24 hr tablet Take 1 tablet (300 mg total) by mouth daily. (NEEDS TO BE SEEN BEFORE NEXT REFILL) 07/06/21   [name], FNP  calcium-vitamin D (OSCAL WITH D) 250-125 MG-UNIT per tablet Take 1 tablet by mouth 2 (two) times daily.    [provider]  cephALEXin (KEFLEX) 500 MG capsule Take 1 capsule (500 mg total) by mouth 2 (two) times daily. 01/18/21   Hassell Done [name], FNP  cholecalciferol (VITAMIN D) 1000 UNITS tablet Take 1,000 Units by mouth 2 (two) times daily.    [provider]  CRANBERRY EXTRACT PO Take 1 tablet by mouth 2 (two) times daily.    [provider]  diclofenac (VOLTAREN) 75 MG EC tablet  Take 1 tablet (75 mg total) by mouth 2 (two) times daily. With food 09/13/20   [name], FNP  levothyroxine (SYNTHROID) 75 MCG tablet Take 1 tablet (75 mcg total) by mouth daily. 09/13/20   Hassell Done [name], FNP  meclizine (ANTIVERT) 25 MG tablet Take 1 tablet (25 mg total) by mouth 3 (three) times daily as needed for dizziness. 01/18/21   [name], FNP  Meclizine HCl 25 MG CHEW TAKE 1 TABLET BY MOUTH 3 TIMES DAILY AS NEEDED FOR DIZZINESS 03/28/21   [name], FNP  Multiple Vitamins-Minerals (MULTIVITAMINS THER. W/MINERALS) TABS tablet Take 1 tablet by mouth 2 (two) times daily.    [provider]  multivitamin-lutein (OCUVITE-LUTEIN) CAPS capsule Take 1 capsule by mouth 2 (two) times daily.    [provider]  Omega-3 Fatty Acids (OMEGA 3 PO) Take 1 capsule by mouth 2 (two) times daily.    [provider]  Potassium Gluconate 595 MG CAPS Take 1 capsule by mouth daily.    [provider]  risperiDONE (RISPERDAL) 2 MG tablet TAKE ONE TABLET BY MOUTH AT BEDTIME 07/07/21   Hassell Done, [name], FNP  Suvorexant (BELSOMRA) 10 MG TABS Take 1 tablet by mouth daily. 09/23/20   Hassell Done, [name], FNP  Thiamine HCl (VITAMIN B-1) 250 MG tablet Take 250 mg by mouth 2 (two) times daily.  [provider]  traMADol (ULTRAM) 50 MG tablet Take 1 tablet (50 mg total) by mouth every 6 (six) hours as needed for moderate pain or severe pain. 11/05/20   Hongalgi, Lenis Dickinson, MD      Allergies    Barium sulfate, Codeine, Contrast media [iodinated contrast media], Iodine, Other, Plaquenil [hydroxychloroquine sulfate], Tape, and Morphine and related    Review of Systems   Review of Systems  Physical Exam Updated Vital Signs BP (!) 164/87 (BP Location: Left Arm)   Pulse 86   Temp 98.2 F (36.8 C) (Oral)   Resp 17   Ht '5\' 7"'$  (1.702 m)   Wt 59 kg   SpO2 100%   BMI 20.36 kg/m  Physical Exam Vitals and nursing note reviewed.   Constitutional:      General: She is not in acute distress.    Appearance: She is well-developed. She is not ill-appearing, toxic-appearing or diaphoretic.  HENT:     Head: Normocephalic and atraumatic.     Right Ear: External ear normal.     Left Ear: External ear normal.     Nose: No congestion or rhinorrhea.     Mouth/Throat:     Pharynx: No oropharyngeal exudate.  Eyes:     Conjunctiva/sclera: Conjunctivae normal.     Pupils: Pupils are equal, round, and reactive to light.  Neck:     Trachea: Phonation normal.  Cardiovascular:     Rate and Rhythm: Normal rate.  Pulmonary:     Effort: Pulmonary effort is normal.  Abdominal:     General: There is no distension.     Palpations: Abdomen is soft.     Tenderness: There is no abdominal tenderness.  Musculoskeletal:        General: Normal range of motion.     Cervical back: Normal range of motion and neck supple.  Skin:    General: Skin is warm and dry.  Neurological:     Mental Status: She is alert.     Cranial Nerves: No cranial nerve deficit.     Sensory: No sensory deficit.     Motor: No abnormal muscle tone.     Coordination: Coordination normal.     Comments: No dysarthria or aphasia or nystagmus.  No ataxia.  Psychiatric:        Attention and Perception: Attention normal.        Mood and Affect: Mood normal.        Speech: Speech normal.        Behavior: Behavior normal. Behavior is not agitated, slowed or combative. Behavior is cooperative.        Cognition and Memory: Cognition is impaired. Memory is impaired. She exhibits impaired recent memory and impaired remote memory.        Judgment: Judgment is impulsive.    ED Results / Procedures / Treatments   Labs (all labs ordered are listed, but only abnormal results are displayed) Labs Reviewed  BRAIN NATRIURETIC PEPTIDE - Abnormal; Notable for the following components:      Result Value   B Natriuretic Peptide 125.0 (*)    All other components within normal  limits  CBC WITH DIFFERENTIAL/PLATELET - Abnormal; Notable for the following components:   RBC 3.47 (*)    Hemoglobin 11.8 (*)    HCT 35.4 (*)    MCV 102.0 (*)    All other components within normal limits  URINALYSIS, ROUTINE W REFLEX MICROSCOPIC - Abnormal; Notable for the following components:  Ketones, ur 20 (*)    Leukocytes,Ua TRACE (*)    Bacteria, UA RARE (*)    All other components within normal limits  RAPID URINE DRUG SCREEN, HOSP PERFORMED - Abnormal; Notable for the following components:   Benzodiazepines POSITIVE (*)    All other components within normal limits  RESP PANEL BY RT-PCR (FLU A&B, COVID) ARPGX2  TSH  ETHANOL    EKG None  Radiology DG Chest 2 View  Result Date: 01/04/2022 CLINICAL DATA:  Altered mental status.  Upper back pain. EXAM: CHEST - 2 VIEW COMPARISON:  None Available. FINDINGS: S shaped scoliosis in the thoracolumbar spine. Elevation of the right hemidiaphragm. Both lungs are clear. Surgical clips on both sides of the chest. Heart and mediastinum are within normal limits. Trachea is midline. No large pleural effusions. Degenerative endplate changes in the thoracic spine. IMPRESSION: No active cardiopulmonary disease. Scoliosis. Electronically Signed   By: Markus Daft M.D.   On: 01/04/2022 17:19   CT Head Wo Contrast  Result Date: 01/04/2022 CLINICAL DATA:  Mental status change EXAM: CT HEAD WITHOUT CONTRAST TECHNIQUE: Contiguous axial images were obtained from the base of the skull through the vertex without intravenous contrast. RADIATION DOSE REDUCTION: This exam was performed according to the departmental dose-optimization program which includes automated exposure control, adjustment of the mA and/or kV according to patient size and/or use of iterative reconstruction technique. COMPARISON:  None Available. FINDINGS: Brain: No acute territorial infarction, hemorrhage or intracranial mass. Mild atrophy. Advanced hypodensity in the white matter likely  chronic small vessel ischemic change. Slight ventricular enlargement likely due to atrophy Vascular: No hyperdense vessels.  No unexpected calcification Skull: Normal. Negative for fracture or focal lesion. Sinuses/Orbits: No acute finding. Other: None IMPRESSION: 1. No CT evidence for acute intracranial abnormality. 2. Chronic small vessel ischemic changes of the white matter. Electronically Signed   By: Donavan Foil M.D.   On: 01/04/2022 17:13    Procedures Procedures    Medications Ordered in ED Medications  acetaminophen (TYLENOL) tablet 650 mg (has no administration in time range)  atorvastatin (LIPITOR) tablet 40 mg (40 mg Oral Given 01/04/22 2211)  diclofenac (VOLTAREN) EC tablet 75 mg (has no administration in time range)  meclizine (ANTIVERT) tablet 25 mg (has no administration in time range)  thiamine tablet 250 mg (250 mg Oral Given 01/04/22 2210)  traMADol (ULTRAM) tablet 50 mg (50 mg Oral Given 01/05/22 0033)  buPROPion (WELLBUTRIN XL) 24 hr tablet 300 mg (has no administration in time range)  levothyroxine (SYNTHROID) tablet 75 mcg (has no administration in time range)  calcium-vitamin D (OSCAL WITH D) 500-5 MG-MCG per tablet 1 tablet (1 tablet Oral Given 01/04/22 2211)  potassium chloride SA (KLOR-CON M) CR tablet 10 mEq (has no administration in time range)    ED Course/ Medical Decision Making/ A&P                           Medical Decision Making Patient with acute confusion, differential diagnosis includes encephalopathy, acute infection, psychiatric illness including psychosis and depression.  Will screen for reversible medical causes of confusion and delirium.  Patient may require evaluation by psychiatry point.  Amount and/or Complexity of Data Reviewed Independent Historian: caregiver    Details: Husband with patient.  He says that the patient continues to drive.  The patient is his caregiver because he is disabled, requires help to stand and is mostly in a wheelchair.   Patient's mother  had severe dementia.  Patient's symptoms have been progressive over several weeks. External Data Reviewed: notes.    Details: She saw her orthopedic doctor 6 days ago for knee pain and was advised to use Voltaren gel.  Note in the EMR from that day reviewed. Labs: ordered.    Details: CBC, metabolic panel, urinalysis, viral panel, drug screen-normal except few ketones in urine, drug screen positive for benzodiazepine, BNP slightly elevated, hemoglobin slightly low Radiology: ordered and independent interpretation performed.    Details: Chest x-ray no infiltrate or edema Discussion of management or test interpretation with external provider(s): Discussed with TOC/social worker.  Plan is to have case management meet with patient and husband in the morning.  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  He does not drive.  Risk OTC drugs. Prescription drug management. Decision regarding hospitalization. Diagnosis or treatment significantly limited by social determinants of health. Risk Details: Elderly female, presenting with confusion and hallucinations, reported by husband.  Gradual onset and worsening.  Her mother had severe dementia.  Patient is unsafe for discharge, without support.  Complicating factor of husband's disability and her being his caregiver requires initiation of evaluation and support with care management.  I anticipate that she can ultimately be discharged with home health services to include social work, nursing, PT and possibly aide.  Family members have little support locally.  The patient has been driving and is at risk for hurting herself or someone else.  She does not require hospitalization at this time.  There is no sign of delirium, or encephalopathy.  Screening medical exam does not show any reversible causes of medical illness that can improve her condition.  I suspect that  the patient has dementia.  She has not had neurologic confirmation of that.  Have ordered TTS consultation for possible help from the psychiatric perspective.  I think it is unlikely that they will place her psychiatrically.  Critical Care Total time providing critical care: 50 minutes          Final Clinical Impression(s) / ED Diagnoses Final diagnoses:  Dementia with other behavioral disturbance, unspecified dementia severity, unspecified dementia type [facility])    Rx / DC Orders ED Discharge Orders     None         Daleen Bo, MD 01/05/22 [phone]

## 2022-01-04 NOTE — ED Notes (Signed)
Patient and husband given snacks and a drink.

## 2022-01-05 MED ORDER — AMLODIPINE BESYLATE 5 MG PO TABS
5.0000 mg | ORAL_TABLET | Freq: Every day | ORAL | Status: DC
  Administered 2022-01-05: 5 mg via ORAL
  Filled 2022-01-05: qty 1

## 2022-01-05 MED ORDER — AMLODIPINE BESYLATE 5 MG PO TABS: 5.0000 mg | ORAL_TABLET | Freq: Every day | ORAL | 0 refills | Status: AC

## 2022-01-05 NOTE — ED Notes (Signed)
CSW spoke with pts husband about [facility] services being set up. Pts husband states they have used [facility] in the past. CSW spoke to [place] with [facility] who accepts [facility] referral. MD placed [facility] PT/OT/SW/RN/Aide services. Pts husband also requesting walker. MD placed DME order. CSW delivered walker supplied by [facility] to pts room. Pt is agreeable to [facility] services and walker. TOC signing off.

## 2022-01-05 NOTE — ED Notes (Signed)
Pt assisted to recliner. 

## 2022-01-05 NOTE — Discharge Instructions (Addendum)
Follow-up with your doctor.  Your blood pressure was elevated and you have been started on new blood pressure medicine.

## 2022-01-05 NOTE — ED Notes (Signed)
EDP aware of bp.

## 2022-01-05 NOTE — ED Notes (Signed)
Pt in recliner.

## 2022-01-05 NOTE — ED Provider Notes (Signed)
  Physical Exam  BP (!) 167/107 Comment: bp rechecked  Pulse 82   Temp 98.2 F (36.8 C) (Oral)   Resp 17   Ht '5\' 7"'$  (1.702 m)   Wt 59 kg   SpO2 94%   BMI 20.36 kg/m   Physical Exam  Procedures  Procedures  ED Course / MDM    Medical Decision Making Amount and/or Complexity of Data Reviewed Labs: ordered. Radiology: ordered.  Risk OTC drugs. Prescription drug management.   Patient been signed out pending transitions of care evaluation for home health.  Did have a hypertension here.  Discussed with patient and has been elevated for a while.  States she goes to the doctor and it is elevated.  Not on treatment.  We will get blood pressure medicine here and will start at home.  Doubt this is a mental status change due to the blood pressure however.  Had been diagnosed with likely dementia by my colleague.  Appears stable for discharge home with outpatient follow-up.  Home health has been arranged and rolling walker given.       Davonna Belling, MD 01/05/22 [phone]

## 2022-01-05 NOTE — BH Assessment (Signed)
Comprehensive Clinical Assessment (CCA) Note  01/05/2022 Kimberly Roth [id number]  Disposition: Kimberly Georges, NP, patient will follow up with Social Work for home health care and placement and case management. Patient is psych cleared. Kimberly Basta, RN, informed of disposition.  The patient demonstrates the following risk factors for suicide: Chronic risk factors for suicide include: psychiatric disorder of depression . Acute risk factors for suicide include: N/A. Protective factors for this patient include: positive social support, responsibility to others (children, family), coping skills, and hope for the future. Considering these factors, the overall suicide risk at this point appears to be low. Patient is appropriate for outpatient follow up.  [facility] from 01/04/2022 in [place] [facility] to Hosp-Admission (Discharged) from 11/02/2020 in [place] No Risk No Risk      Kimberly Roth is a 76 year old female presenting voluntary to [facility] due to altered mental status. Patient denied SI, HI, psychosis and alcohol/drug usage. When asked, why are you here, patient stated " pain in my shoulder, left arm and back, a low fever, I am just feeling unwell". Patient reported current stressors are "afraid to loose husband who is 79 years older than I am". Patient reported she cares for husband during day and night. Patient denied depressive symptoms, however then stated "I just get the blues". Patient denied prior psych hospitalizations, suicide attempts and self-harming behaviors. Patient denied receiving mental health outpatient services. Patient denied being prescribed psych medications, stating "I was taking lithium for 12 years and I stop taking it 3 years ago", which was prescribed by PCP.   Patient resides with husband and has been married for 23 years. Patient is retired and was a Marine scientist. Patient reported caring for her husband and caring for her  2 horses which are currently bordered with other horses. Patient denied access to guns. Patient was calm and cooperative during assessment. Collateral contact, Kimberly Roth, unable to reach at this time, [phone].   Chief Complaint:  Chief Complaint  Patient presents with   Altered Mental Status   Visit Diagnosis:  Major Depressive Disorder   CCA Screening, Triage and Referral (STR)  Patient Reported Information How did you hear about [place]? Family/Friend  What Is the Reason for Your Visit/Call Today? Per husband pt is taking to people who aren't there, make arrangement for things that don't exist.  How Long Has This Been Causing You Problems? 1 wk - 1 month  What Do You Feel Would Help You the Most Today? Treatment for Depression or other mood problem   Have You Recently Had Any Thoughts About Hurting Yourself? No  Are You Planning to Commit Suicide/Harm Yourself At This time? No   Have you Recently Had Thoughts About [place]? No  Are You Planning to Harm Someone at This Time? No  Explanation: No data recorded  Have You Used Any Alcohol or Drugs in the Past 24 Hours? No  How Long Ago Did You Use Drugs or Alcohol? No data recorded What Did You Use and How Much? No data recorded  Do You Currently Have a Therapist/Psychiatrist? No  Name of Therapist/Psychiatrist: No data recorded  Have You Been Recently Discharged From Any Office Practice or Programs? No  Explanation of Discharge From Practice/Program: No data recorded    CCA Screening Triage Referral Assessment Type of Contact: Tele-Assessment  Telemedicine Service Delivery:   Is this Initial or Reassessment? Initial Assessment  Date Telepsych consult ordered  in [facility]:  01/04/22  Time Telepsych consult ordered in [facility]:  2021  Location of Assessment: [facility]  Provider Location: [facility]   Collateral Involvement: Warden Fillers, husband   Does Patient Have a Court Appointed Legal  Guardian? No data recorded Name and Contact of Legal Guardian: No data recorded If Minor and Not Living with Parent(s), Who has Custody? No data recorded Is CPS involved or ever been involved? Never  Is APS involved or ever been involved? Never   Patient Determined To Be At Risk for Harm To Self or Others Based on Review of Patient Reported Information or Presenting Complaint? No data recorded Method: No data recorded Availability of Means: No data recorded Intent: No data recorded Notification Required: No data recorded Additional Information for Danger to Others Potential: No data recorded Additional Comments for Danger to Others Potential: No data recorded Are There Guns or Other Weapons in Your Home? No data recorded Types of Guns/Weapons: No data recorded Are These Weapons Safely Secured?                            No data recorded Who Could Verify You Are Able To Have These Secured: No data recorded Do You Have any Outstanding Charges, Pending Court Dates, Parole/Probation? No data recorded Contacted To Inform of Risk of Harm To Self or Others: No data recorded   Does Patient Present under Involuntary Commitment? No  IVC Papers Initial File Date: No data recorded  [place] of Residence: [place]   Patient Currently Receiving the Following Services: Not Receiving Services   Determination of Need: Routine (7 days)   Options For Referral: Social research officer, government; ALF/SNF; Outpatient Therapy; Medication Management     CCA Biopsychosocial Patient Reported Schizophrenia/Schizoaffective Diagnosis in Past: No data recorded  Strengths: self-awareness   Mental Health Symptoms Depression:   None ("not depression, only the blues")   Duration of Depressive symptoms:    Mania:   None   Anxiety:    None   Psychosis:   None   Duration of Psychotic symptoms:    Trauma:   None   Obsessions:   None   Compulsions:   None   Inattention:   None    Hyperactivity/Impulsivity:   None   Oppositional/Defiant Behaviors:   None   Emotional Irregularity:   None   Other Mood/Personality Symptoms:  No data recorded   Mental Status Exam Appearance and self-care  Stature:   Average   Weight:   Average weight   Clothing:   Neat/clean   Grooming:   Normal   Cosmetic use:   None   Posture/gait:   Normal   Motor activity:   Not Remarkable   Sensorium  Attention:   Normal   Concentration:   Normal   Orientation:   X5   Recall/memory:   Normal   Affect and Mood  Affect:   Appropriate   Mood:   Depressed   Relating  Eye contact:   Normal   Facial expression:   Depressed; Sad   Attitude toward examiner:   Cooperative   Thought and Language  Speech flow:  Normal   Thought content:   Appropriate to Mood and Circumstances   Preoccupation:   None   Hallucinations:   None   Organization:  No data recorded  [employer] of Knowledge:   Average   Intelligence:   Average   Abstraction:   Normal  Judgement:   -- (per husband confused)   Reality Testing:   -- (per husband confused)   Insight:   Lacking   Decision Making:   -- (per husband confused)   Social Functioning  Social Maturity:   Responsible   Social Judgement:   Normal   Stress  Stressors:   Other (Comment) ("taking care of husband")   Coping Ability:   Resilient   Skill Deficits:   Self-care; Activities of daily living   Supports:   Family     Religion: Religion/Spirituality Are You A Religious Person?:  Special educational needs teacher)  Leisure/Recreation: Leisure / [place]?: Yes Leisure and Hobbies: caring for horses  Exercise/Diet: Exercise/Diet Do You Exercise?:  (uta) Do You Follow a Special Diet?:  (uta) Do You Have Any Trouble Sleeping?: Yes Explanation of Sleeping Difficulties: 4 hours nightly   CCA Employment/Education Employment/Work Situation: Employment / Work  Situation Employment Situation: Retired  Education: Education Is Patient Currently Attending School?: No Last Grade Completed: 72 Did [place]?: Yes What Type of College Degree Do you Have?: BS in Nursing Did You Have An Individualized Education Program (IIEP):  [employer]) Did You Have Any Difficulty At School?:  [employer]) Patient's Education Has Been Impacted by Current Illness:  (uta)   CCA Family/Childhood History Family and Relationship History: Family history Marital status: Single Does patient have children?: Yes How many children?: 6 How is patient's relationship with their children?: "havent spoke to them in 15 years, I don't know why"  Childhood History:  Childhood History By whom was/is the patient raised?:  (uta) Did patient suffer any verbal/emotional/physical/sexual abuse as a child?: No Did patient suffer from severe childhood neglect?: No Has patient ever been sexually abused/assaulted/raped as an adolescent or adult?: No  Child/Adolescent Assessment:     CCA Substance Use Alcohol/Drug Use: Alcohol / Drug Use Pain Medications: see MAR Prescriptions: see MAR Over the Counter: see MAR History of alcohol / drug use?: No history of alcohol / drug abuse                         ASAM's:  Six Dimensions of Multidimensional Assessment  Dimension 1:  Acute Intoxication and/or Withdrawal Potential:      Dimension 2:  Biomedical Conditions and Complications:      Dimension 3:  Emotional, Behavioral, or Cognitive Conditions and Complications:     Dimension 4:  Readiness to Change:     Dimension 5:  Relapse, Continued use, or Continued Problem Potential:     Dimension 6:  Recovery/Living Environment:     ASAM Severity Score:    ASAM Recommended Level of Treatment:     Substance use Disorder (SUD)    Recommendations for Services/Supports/Treatments:    Discharge Disposition:    DSM5 Diagnoses: Patient Active Problem List   Diagnosis Date  Noted   History of revision of total knee arthroplasty 12/29/2021   Pes anserinus bursitis of left knee 12/29/2021   Intractable pain 11/03/2020   Neck pain 09/13/2020   Adrenal incidentaloma ([place]) 11/26/2019   Ventral hernia without obstruction or gangrene 11/17/2019   Traumatic partial tear of right biceps tendon 06/29/2016   Spondylosis of cervical region without myelopathy or radiculopathy 06/29/2016   Systemic lupus ([place]) 09/02/2014   Hyperlipidemia 09/02/2014   Hypothyroidism 04/09/2013   Depression 04/09/2013   GAD (generalized anxiety disorder) 04/09/2013     Referrals to Alternative Service(s): Referred to Alternative Service(s):   Place:  Date:   Time:    Referred to Alternative Service(s):   Place:   Date:   Time:    Referred to Alternative Service(s):   Place:   Date:   Time:    Referred to Alternative Service(s):   Place:   Date:   Time:     Venora Maples, [facility]

## 2022-01-06 ENCOUNTER — Other Ambulatory Visit: Payer: Self-pay | Admitting: Nurse Practitioner

## 2022-01-06 DIAGNOSIS — E785 Hyperlipidemia, unspecified: Secondary | ICD-10-CM | POA: Diagnosis not present

## 2022-01-06 DIAGNOSIS — R410 Disorientation, unspecified: Secondary | ICD-10-CM | POA: Diagnosis not present

## 2022-01-06 DIAGNOSIS — M329 Systemic lupus erythematosus, unspecified: Secondary | ICD-10-CM | POA: Diagnosis not present

## 2022-01-06 DIAGNOSIS — F411 Generalized anxiety disorder: Secondary | ICD-10-CM | POA: Diagnosis not present

## 2022-01-06 DIAGNOSIS — Z8544 Personal history of malignant neoplasm of other female genital organs: Secondary | ICD-10-CM | POA: Diagnosis not present

## 2022-01-06 DIAGNOSIS — Z853 Personal history of malignant neoplasm of breast: Secondary | ICD-10-CM | POA: Diagnosis not present

## 2022-01-06 DIAGNOSIS — Z9181 History of falling: Secondary | ICD-10-CM | POA: Diagnosis not present

## 2022-01-06 DIAGNOSIS — M542 Cervicalgia: Secondary | ICD-10-CM | POA: Diagnosis not present

## 2022-01-06 DIAGNOSIS — E039 Hypothyroidism, unspecified: Secondary | ICD-10-CM | POA: Diagnosis not present

## 2022-01-06 DIAGNOSIS — M1712 Unilateral primary osteoarthritis, left knee: Secondary | ICD-10-CM | POA: Diagnosis not present

## 2022-01-06 DIAGNOSIS — I10 Essential (primary) hypertension: Secondary | ICD-10-CM | POA: Diagnosis not present

## 2022-01-06 DIAGNOSIS — F32A Depression, unspecified: Secondary | ICD-10-CM | POA: Diagnosis not present

## 2022-01-06 DIAGNOSIS — M7052 Other bursitis of knee, left knee: Secondary | ICD-10-CM | POA: Diagnosis not present

## 2022-01-06 DIAGNOSIS — M47812 Spondylosis without myelopathy or radiculopathy, cervical region: Secondary | ICD-10-CM | POA: Diagnosis not present

## 2022-01-06 DIAGNOSIS — Z79891 Long term (current) use of opiate analgesic: Secondary | ICD-10-CM | POA: Diagnosis not present

## 2022-01-10 ENCOUNTER — Other Ambulatory Visit: Payer: Self-pay | Admitting: Nurse Practitioner

## 2022-01-11 DIAGNOSIS — F411 Generalized anxiety disorder: Secondary | ICD-10-CM | POA: Diagnosis not present

## 2022-01-11 DIAGNOSIS — M7052 Other bursitis of knee, left knee: Secondary | ICD-10-CM | POA: Diagnosis not present

## 2022-01-11 DIAGNOSIS — M1712 Unilateral primary osteoarthritis, left knee: Secondary | ICD-10-CM | POA: Diagnosis not present

## 2022-01-11 DIAGNOSIS — M47812 Spondylosis without myelopathy or radiculopathy, cervical region: Secondary | ICD-10-CM | POA: Diagnosis not present

## 2022-01-11 DIAGNOSIS — I10 Essential (primary) hypertension: Secondary | ICD-10-CM | POA: Diagnosis not present

## 2022-01-11 DIAGNOSIS — M542 Cervicalgia: Secondary | ICD-10-CM | POA: Diagnosis not present

## 2022-01-12 DIAGNOSIS — M47812 Spondylosis without myelopathy or radiculopathy, cervical region: Secondary | ICD-10-CM | POA: Diagnosis not present

## 2022-01-12 DIAGNOSIS — I10 Essential (primary) hypertension: Secondary | ICD-10-CM | POA: Diagnosis not present

## 2022-01-12 DIAGNOSIS — M542 Cervicalgia: Secondary | ICD-10-CM | POA: Diagnosis not present

## 2022-01-12 DIAGNOSIS — F411 Generalized anxiety disorder: Secondary | ICD-10-CM | POA: Diagnosis not present

## 2022-01-12 DIAGNOSIS — M7052 Other bursitis of knee, left knee: Secondary | ICD-10-CM | POA: Diagnosis not present

## 2022-01-12 DIAGNOSIS — M1712 Unilateral primary osteoarthritis, left knee: Secondary | ICD-10-CM | POA: Diagnosis not present

## 2022-01-13 DIAGNOSIS — M47812 Spondylosis without myelopathy or radiculopathy, cervical region: Secondary | ICD-10-CM | POA: Diagnosis not present

## 2022-01-13 DIAGNOSIS — M542 Cervicalgia: Secondary | ICD-10-CM | POA: Diagnosis not present

## 2022-01-13 DIAGNOSIS — F411 Generalized anxiety disorder: Secondary | ICD-10-CM | POA: Diagnosis not present

## 2022-01-13 DIAGNOSIS — I10 Essential (primary) hypertension: Secondary | ICD-10-CM | POA: Diagnosis not present

## 2022-01-13 DIAGNOSIS — M7052 Other bursitis of knee, left knee: Secondary | ICD-10-CM | POA: Diagnosis not present

## 2022-01-13 DIAGNOSIS — M1712 Unilateral primary osteoarthritis, left knee: Secondary | ICD-10-CM | POA: Diagnosis not present

## 2022-01-20 DIAGNOSIS — M47812 Spondylosis without myelopathy or radiculopathy, cervical region: Secondary | ICD-10-CM | POA: Diagnosis not present

## 2022-01-20 DIAGNOSIS — I10 Essential (primary) hypertension: Secondary | ICD-10-CM | POA: Diagnosis not present

## 2022-01-20 DIAGNOSIS — M542 Cervicalgia: Secondary | ICD-10-CM | POA: Diagnosis not present

## 2022-01-20 DIAGNOSIS — M1712 Unilateral primary osteoarthritis, left knee: Secondary | ICD-10-CM | POA: Diagnosis not present

## 2022-01-20 DIAGNOSIS — M7052 Other bursitis of knee, left knee: Secondary | ICD-10-CM | POA: Diagnosis not present

## 2022-01-20 DIAGNOSIS — F411 Generalized anxiety disorder: Secondary | ICD-10-CM | POA: Diagnosis not present

## 2022-01-21 DIAGNOSIS — F411 Generalized anxiety disorder: Secondary | ICD-10-CM | POA: Diagnosis not present

## 2022-01-21 DIAGNOSIS — M47812 Spondylosis without myelopathy or radiculopathy, cervical region: Secondary | ICD-10-CM | POA: Diagnosis not present

## 2022-01-21 DIAGNOSIS — M1712 Unilateral primary osteoarthritis, left knee: Secondary | ICD-10-CM | POA: Diagnosis not present

## 2022-01-21 DIAGNOSIS — I10 Essential (primary) hypertension: Secondary | ICD-10-CM | POA: Diagnosis not present

## 2022-01-21 DIAGNOSIS — M7052 Other bursitis of knee, left knee: Secondary | ICD-10-CM | POA: Diagnosis not present

## 2022-01-21 DIAGNOSIS — M542 Cervicalgia: Secondary | ICD-10-CM | POA: Diagnosis not present

## 2022-01-25 DIAGNOSIS — M7052 Other bursitis of knee, left knee: Secondary | ICD-10-CM | POA: Diagnosis not present

## 2022-01-25 DIAGNOSIS — F411 Generalized anxiety disorder: Secondary | ICD-10-CM | POA: Diagnosis not present

## 2022-01-25 DIAGNOSIS — M1712 Unilateral primary osteoarthritis, left knee: Secondary | ICD-10-CM | POA: Diagnosis not present

## 2022-01-25 DIAGNOSIS — I10 Essential (primary) hypertension: Secondary | ICD-10-CM | POA: Diagnosis not present

## 2022-01-25 DIAGNOSIS — M47812 Spondylosis without myelopathy or radiculopathy, cervical region: Secondary | ICD-10-CM | POA: Diagnosis not present

## 2022-01-25 DIAGNOSIS — M542 Cervicalgia: Secondary | ICD-10-CM | POA: Diagnosis not present

## 2022-01-31 DIAGNOSIS — M7052 Other bursitis of knee, left knee: Secondary | ICD-10-CM | POA: Diagnosis not present

## 2022-01-31 DIAGNOSIS — F411 Generalized anxiety disorder: Secondary | ICD-10-CM | POA: Diagnosis not present

## 2022-01-31 DIAGNOSIS — M47812 Spondylosis without myelopathy or radiculopathy, cervical region: Secondary | ICD-10-CM | POA: Diagnosis not present

## 2022-01-31 DIAGNOSIS — M1712 Unilateral primary osteoarthritis, left knee: Secondary | ICD-10-CM | POA: Diagnosis not present

## 2022-01-31 DIAGNOSIS — I10 Essential (primary) hypertension: Secondary | ICD-10-CM | POA: Diagnosis not present

## 2022-01-31 DIAGNOSIS — M542 Cervicalgia: Secondary | ICD-10-CM | POA: Diagnosis not present

## 2022-02-02 DIAGNOSIS — F411 Generalized anxiety disorder: Secondary | ICD-10-CM | POA: Diagnosis not present

## 2022-02-02 DIAGNOSIS — M1712 Unilateral primary osteoarthritis, left knee: Secondary | ICD-10-CM | POA: Diagnosis not present

## 2022-02-02 DIAGNOSIS — M542 Cervicalgia: Secondary | ICD-10-CM | POA: Diagnosis not present

## 2022-02-02 DIAGNOSIS — I10 Essential (primary) hypertension: Secondary | ICD-10-CM | POA: Diagnosis not present

## 2022-02-02 DIAGNOSIS — M47812 Spondylosis without myelopathy or radiculopathy, cervical region: Secondary | ICD-10-CM | POA: Diagnosis not present

## 2022-02-02 DIAGNOSIS — M7052 Other bursitis of knee, left knee: Secondary | ICD-10-CM | POA: Diagnosis not present

## 2022-02-05 DIAGNOSIS — E039 Hypothyroidism, unspecified: Secondary | ICD-10-CM | POA: Diagnosis not present

## 2022-02-05 DIAGNOSIS — Z853 Personal history of malignant neoplasm of breast: Secondary | ICD-10-CM | POA: Diagnosis not present

## 2022-02-05 DIAGNOSIS — Z79891 Long term (current) use of opiate analgesic: Secondary | ICD-10-CM | POA: Diagnosis not present

## 2022-02-05 DIAGNOSIS — Z8544 Personal history of malignant neoplasm of other female genital organs: Secondary | ICD-10-CM | POA: Diagnosis not present

## 2022-02-05 DIAGNOSIS — M1712 Unilateral primary osteoarthritis, left knee: Secondary | ICD-10-CM | POA: Diagnosis not present

## 2022-02-05 DIAGNOSIS — Z9181 History of falling: Secondary | ICD-10-CM | POA: Diagnosis not present

## 2022-02-05 DIAGNOSIS — I10 Essential (primary) hypertension: Secondary | ICD-10-CM | POA: Diagnosis not present

## 2022-02-05 DIAGNOSIS — M47812 Spondylosis without myelopathy or radiculopathy, cervical region: Secondary | ICD-10-CM | POA: Diagnosis not present

## 2022-02-05 DIAGNOSIS — M7052 Other bursitis of knee, left knee: Secondary | ICD-10-CM | POA: Diagnosis not present

## 2022-02-05 DIAGNOSIS — M329 Systemic lupus erythematosus, unspecified: Secondary | ICD-10-CM | POA: Diagnosis not present

## 2022-02-05 DIAGNOSIS — R410 Disorientation, unspecified: Secondary | ICD-10-CM | POA: Diagnosis not present

## 2022-02-05 DIAGNOSIS — F411 Generalized anxiety disorder: Secondary | ICD-10-CM | POA: Diagnosis not present

## 2022-02-05 DIAGNOSIS — E785 Hyperlipidemia, unspecified: Secondary | ICD-10-CM | POA: Diagnosis not present

## 2022-02-05 DIAGNOSIS — M542 Cervicalgia: Secondary | ICD-10-CM | POA: Diagnosis not present

## 2022-02-05 DIAGNOSIS — F32A Depression, unspecified: Secondary | ICD-10-CM | POA: Diagnosis not present

## 2022-02-07 DIAGNOSIS — I10 Essential (primary) hypertension: Secondary | ICD-10-CM | POA: Diagnosis not present

## 2022-02-07 DIAGNOSIS — F411 Generalized anxiety disorder: Secondary | ICD-10-CM | POA: Diagnosis not present

## 2022-02-07 DIAGNOSIS — M47812 Spondylosis without myelopathy or radiculopathy, cervical region: Secondary | ICD-10-CM | POA: Diagnosis not present

## 2022-02-07 DIAGNOSIS — M1712 Unilateral primary osteoarthritis, left knee: Secondary | ICD-10-CM | POA: Diagnosis not present

## 2022-02-07 DIAGNOSIS — M7052 Other bursitis of knee, left knee: Secondary | ICD-10-CM | POA: Diagnosis not present

## 2022-02-07 DIAGNOSIS — M542 Cervicalgia: Secondary | ICD-10-CM | POA: Diagnosis not present

## 2022-02-10 DIAGNOSIS — M542 Cervicalgia: Secondary | ICD-10-CM | POA: Diagnosis not present

## 2022-02-10 DIAGNOSIS — M7052 Other bursitis of knee, left knee: Secondary | ICD-10-CM | POA: Diagnosis not present

## 2022-02-10 DIAGNOSIS — I10 Essential (primary) hypertension: Secondary | ICD-10-CM | POA: Diagnosis not present

## 2022-02-10 DIAGNOSIS — M47812 Spondylosis without myelopathy or radiculopathy, cervical region: Secondary | ICD-10-CM | POA: Diagnosis not present

## 2022-02-10 DIAGNOSIS — M1712 Unilateral primary osteoarthritis, left knee: Secondary | ICD-10-CM | POA: Diagnosis not present

## 2022-02-10 DIAGNOSIS — F411 Generalized anxiety disorder: Secondary | ICD-10-CM | POA: Diagnosis not present

## 2022-02-14 DIAGNOSIS — M1712 Unilateral primary osteoarthritis, left knee: Secondary | ICD-10-CM | POA: Diagnosis not present

## 2022-02-14 DIAGNOSIS — M542 Cervicalgia: Secondary | ICD-10-CM | POA: Diagnosis not present

## 2022-02-14 DIAGNOSIS — M47812 Spondylosis without myelopathy or radiculopathy, cervical region: Secondary | ICD-10-CM | POA: Diagnosis not present

## 2022-02-14 DIAGNOSIS — M7052 Other bursitis of knee, left knee: Secondary | ICD-10-CM | POA: Diagnosis not present

## 2022-02-14 DIAGNOSIS — I10 Essential (primary) hypertension: Secondary | ICD-10-CM | POA: Diagnosis not present

## 2022-02-14 DIAGNOSIS — F411 Generalized anxiety disorder: Secondary | ICD-10-CM | POA: Diagnosis not present

## 2022-02-22 DIAGNOSIS — M1712 Unilateral primary osteoarthritis, left knee: Secondary | ICD-10-CM | POA: Diagnosis not present

## 2022-02-22 DIAGNOSIS — M542 Cervicalgia: Secondary | ICD-10-CM | POA: Diagnosis not present

## 2022-02-22 DIAGNOSIS — M47812 Spondylosis without myelopathy or radiculopathy, cervical region: Secondary | ICD-10-CM | POA: Diagnosis not present

## 2022-02-22 DIAGNOSIS — I10 Essential (primary) hypertension: Secondary | ICD-10-CM | POA: Diagnosis not present

## 2022-02-22 DIAGNOSIS — M7052 Other bursitis of knee, left knee: Secondary | ICD-10-CM | POA: Diagnosis not present

## 2022-02-22 DIAGNOSIS — F411 Generalized anxiety disorder: Secondary | ICD-10-CM | POA: Diagnosis not present

## 2022-02-23 DIAGNOSIS — I10 Essential (primary) hypertension: Secondary | ICD-10-CM | POA: Diagnosis not present

## 2022-02-23 DIAGNOSIS — M1712 Unilateral primary osteoarthritis, left knee: Secondary | ICD-10-CM | POA: Diagnosis not present

## 2022-02-23 DIAGNOSIS — M542 Cervicalgia: Secondary | ICD-10-CM | POA: Diagnosis not present

## 2022-02-23 DIAGNOSIS — M7052 Other bursitis of knee, left knee: Secondary | ICD-10-CM | POA: Diagnosis not present

## 2022-02-23 DIAGNOSIS — F411 Generalized anxiety disorder: Secondary | ICD-10-CM | POA: Diagnosis not present

## 2022-02-23 DIAGNOSIS — M47812 Spondylosis without myelopathy or radiculopathy, cervical region: Secondary | ICD-10-CM | POA: Diagnosis not present

## 2022-03-02 DIAGNOSIS — F411 Generalized anxiety disorder: Secondary | ICD-10-CM | POA: Diagnosis not present

## 2022-03-02 DIAGNOSIS — I10 Essential (primary) hypertension: Secondary | ICD-10-CM | POA: Diagnosis not present

## 2022-03-02 DIAGNOSIS — M47812 Spondylosis without myelopathy or radiculopathy, cervical region: Secondary | ICD-10-CM | POA: Diagnosis not present

## 2022-03-02 DIAGNOSIS — M1712 Unilateral primary osteoarthritis, left knee: Secondary | ICD-10-CM | POA: Diagnosis not present

## 2022-03-02 DIAGNOSIS — M542 Cervicalgia: Secondary | ICD-10-CM | POA: Diagnosis not present

## 2022-03-02 DIAGNOSIS — M7052 Other bursitis of knee, left knee: Secondary | ICD-10-CM | POA: Diagnosis not present

## 2022-03-23 DIAGNOSIS — M542 Cervicalgia: Secondary | ICD-10-CM | POA: Diagnosis not present

## 2022-03-23 DIAGNOSIS — M25562 Pain in left knee: Secondary | ICD-10-CM | POA: Diagnosis not present

## 2022-03-23 DIAGNOSIS — F411 Generalized anxiety disorder: Secondary | ICD-10-CM | POA: Diagnosis not present

## 2022-03-23 DIAGNOSIS — E039 Hypothyroidism, unspecified: Secondary | ICD-10-CM | POA: Diagnosis not present

## 2022-03-24 DIAGNOSIS — M542 Cervicalgia: Secondary | ICD-10-CM | POA: Diagnosis not present

## 2022-03-24 DIAGNOSIS — E039 Hypothyroidism, unspecified: Secondary | ICD-10-CM | POA: Diagnosis not present

## 2022-03-24 DIAGNOSIS — M25562 Pain in left knee: Secondary | ICD-10-CM | POA: Diagnosis not present

## 2022-03-24 DIAGNOSIS — F411 Generalized anxiety disorder: Secondary | ICD-10-CM | POA: Diagnosis not present

## 2022-04-27 DIAGNOSIS — H04123 Dry eye syndrome of bilateral lacrimal glands: Secondary | ICD-10-CM | POA: Diagnosis not present

## 2022-04-27 DIAGNOSIS — H40033 Anatomical narrow angle, bilateral: Secondary | ICD-10-CM | POA: Diagnosis not present

## 2022-07-06 DIAGNOSIS — E039 Hypothyroidism, unspecified: Secondary | ICD-10-CM | POA: Diagnosis not present

## 2022-07-06 DIAGNOSIS — F411 Generalized anxiety disorder: Secondary | ICD-10-CM | POA: Diagnosis not present

## 2022-07-06 DIAGNOSIS — L932 Other local lupus erythematosus: Secondary | ICD-10-CM | POA: Diagnosis not present

## 2022-08-03 DIAGNOSIS — R300 Dysuria: Secondary | ICD-10-CM | POA: Diagnosis not present

## 2022-08-03 DIAGNOSIS — R3915 Urgency of urination: Secondary | ICD-10-CM | POA: Diagnosis not present

## 2022-08-03 DIAGNOSIS — R350 Frequency of micturition: Secondary | ICD-10-CM | POA: Diagnosis not present

## 2022-08-03 DIAGNOSIS — N3001 Acute cystitis with hematuria: Secondary | ICD-10-CM | POA: Diagnosis not present

## 2022-08-03 DIAGNOSIS — R39198 Other difficulties with micturition: Secondary | ICD-10-CM | POA: Diagnosis not present

## 2022-09-28 ENCOUNTER — Encounter: Payer: Self-pay | Admitting: Radiology

## 2022-10-10 DIAGNOSIS — Z885 Allergy status to narcotic agent status: Secondary | ICD-10-CM | POA: Diagnosis not present

## 2022-10-10 DIAGNOSIS — M79642 Pain in left hand: Secondary | ICD-10-CM | POA: Diagnosis not present

## 2022-10-10 DIAGNOSIS — E278 Other specified disorders of adrenal gland: Secondary | ICD-10-CM | POA: Diagnosis not present

## 2022-10-10 DIAGNOSIS — S069X1A Unspecified intracranial injury with loss of consciousness of 30 minutes or less, initial encounter: Secondary | ICD-10-CM | POA: Diagnosis not present

## 2022-10-10 DIAGNOSIS — R000 Tachycardia, unspecified: Secondary | ICD-10-CM | POA: Diagnosis not present

## 2022-10-10 DIAGNOSIS — R1032 Left lower quadrant pain: Secondary | ICD-10-CM | POA: Diagnosis not present

## 2022-10-10 DIAGNOSIS — R102 Pelvic and perineal pain: Secondary | ICD-10-CM | POA: Diagnosis not present

## 2022-10-10 DIAGNOSIS — R2689 Other abnormalities of gait and mobility: Secondary | ICD-10-CM | POA: Diagnosis not present

## 2022-10-10 DIAGNOSIS — Z853 Personal history of malignant neoplasm of breast: Secondary | ICD-10-CM | POA: Diagnosis not present

## 2022-10-10 DIAGNOSIS — M329 Systemic lupus erythematosus, unspecified: Secondary | ICD-10-CM | POA: Diagnosis present

## 2022-10-10 DIAGNOSIS — Z041 Encounter for examination and observation following transport accident: Secondary | ICD-10-CM | POA: Diagnosis not present

## 2022-10-10 DIAGNOSIS — S01112A Laceration without foreign body of left eyelid and periocular area, initial encounter: Secondary | ICD-10-CM | POA: Diagnosis present

## 2022-10-10 DIAGNOSIS — R0689 Other abnormalities of breathing: Secondary | ICD-10-CM | POA: Diagnosis not present

## 2022-10-10 DIAGNOSIS — S0181XA Laceration without foreign body of other part of head, initial encounter: Secondary | ICD-10-CM | POA: Diagnosis present

## 2022-10-10 DIAGNOSIS — M25552 Pain in left hip: Secondary | ICD-10-CM | POA: Diagnosis not present

## 2022-10-10 DIAGNOSIS — E039 Hypothyroidism, unspecified: Secondary | ICD-10-CM | POA: Diagnosis present

## 2022-10-10 DIAGNOSIS — N130 Hydronephrosis with ureteropelvic junction obstruction: Secondary | ICD-10-CM | POA: Diagnosis present

## 2022-10-10 DIAGNOSIS — Z9989 Dependence on other enabling machines and devices: Secondary | ICD-10-CM | POA: Diagnosis not present

## 2022-10-10 DIAGNOSIS — I10 Essential (primary) hypertension: Secondary | ICD-10-CM | POA: Diagnosis not present

## 2022-10-10 DIAGNOSIS — S020XXA Fracture of vault of skull, initial encounter for closed fracture: Secondary | ICD-10-CM | POA: Diagnosis present

## 2022-10-10 DIAGNOSIS — Z751 Person awaiting admission to adequate facility elsewhere: Secondary | ICD-10-CM | POA: Diagnosis not present

## 2022-10-10 DIAGNOSIS — M25532 Pain in left wrist: Secondary | ICD-10-CM | POA: Diagnosis not present

## 2022-10-10 DIAGNOSIS — Z79899 Other long term (current) drug therapy: Secondary | ICD-10-CM | POA: Diagnosis not present

## 2022-10-10 DIAGNOSIS — M79652 Pain in left thigh: Secondary | ICD-10-CM | POA: Diagnosis not present

## 2022-10-10 DIAGNOSIS — R339 Retention of urine, unspecified: Secondary | ICD-10-CM | POA: Diagnosis not present

## 2022-10-10 DIAGNOSIS — F0390 Unspecified dementia without behavioral disturbance: Secondary | ICD-10-CM | POA: Diagnosis present

## 2022-10-10 DIAGNOSIS — F419 Anxiety disorder, unspecified: Secondary | ICD-10-CM | POA: Diagnosis present

## 2022-10-10 DIAGNOSIS — S72045A Nondisplaced fracture of base of neck of left femur, initial encounter for closed fracture: Secondary | ICD-10-CM | POA: Diagnosis not present

## 2022-10-10 DIAGNOSIS — Z888 Allergy status to other drugs, medicaments and biological substances status: Secondary | ICD-10-CM | POA: Diagnosis not present

## 2022-10-10 DIAGNOSIS — F32A Depression, unspecified: Secondary | ICD-10-CM | POA: Diagnosis present

## 2022-10-10 DIAGNOSIS — R296 Repeated falls: Secondary | ICD-10-CM | POA: Diagnosis present

## 2022-10-10 DIAGNOSIS — S32502A Unspecified fracture of left pubis, initial encounter for closed fracture: Secondary | ICD-10-CM | POA: Diagnosis not present

## 2022-10-10 DIAGNOSIS — R410 Disorientation, unspecified: Secondary | ICD-10-CM | POA: Diagnosis present

## 2022-10-10 DIAGNOSIS — R58 Hemorrhage, not elsewhere classified: Secondary | ICD-10-CM | POA: Diagnosis not present

## 2022-10-10 DIAGNOSIS — S0282XA Fracture of other specified skull and facial bones, left side, initial encounter for closed fracture: Secondary | ICD-10-CM | POA: Diagnosis not present

## 2022-10-10 DIAGNOSIS — G4700 Insomnia, unspecified: Secondary | ICD-10-CM | POA: Diagnosis not present

## 2022-10-10 DIAGNOSIS — M25562 Pain in left knee: Secondary | ICD-10-CM | POA: Diagnosis not present

## 2022-10-10 DIAGNOSIS — D62 Acute posthemorrhagic anemia: Secondary | ICD-10-CM | POA: Diagnosis not present

## 2022-10-10 DIAGNOSIS — N1339 Other hydronephrosis: Secondary | ICD-10-CM | POA: Diagnosis not present

## 2022-10-10 DIAGNOSIS — N135 Crossing vessel and stricture of ureter without hydronephrosis: Secondary | ICD-10-CM | POA: Diagnosis not present

## 2022-10-10 DIAGNOSIS — S0101XA Laceration without foreign body of scalp, initial encounter: Secondary | ICD-10-CM | POA: Diagnosis not present

## 2022-10-10 DIAGNOSIS — Z91048 Other nonmedicinal substance allergy status: Secondary | ICD-10-CM | POA: Diagnosis not present

## 2022-10-10 DIAGNOSIS — I440 Atrioventricular block, first degree: Secondary | ICD-10-CM | POA: Diagnosis not present

## 2022-10-10 DIAGNOSIS — M79632 Pain in left forearm: Secondary | ICD-10-CM | POA: Diagnosis not present

## 2022-10-10 DIAGNOSIS — S32592A Other specified fracture of left pubis, initial encounter for closed fracture: Secondary | ICD-10-CM | POA: Diagnosis present

## 2022-10-10 DIAGNOSIS — Z79890 Hormone replacement therapy (postmenopausal): Secondary | ICD-10-CM | POA: Diagnosis not present

## 2022-10-11 DIAGNOSIS — S0181XA Laceration without foreign body of other part of head, initial encounter: Secondary | ICD-10-CM | POA: Diagnosis present

## 2022-10-11 DIAGNOSIS — Z91048 Other nonmedicinal substance allergy status: Secondary | ICD-10-CM | POA: Diagnosis not present

## 2022-10-11 DIAGNOSIS — R102 Pelvic and perineal pain: Secondary | ICD-10-CM | POA: Diagnosis not present

## 2022-10-11 DIAGNOSIS — F32A Depression, unspecified: Secondary | ICD-10-CM | POA: Diagnosis present

## 2022-10-11 DIAGNOSIS — M329 Systemic lupus erythematosus, unspecified: Secondary | ICD-10-CM | POA: Diagnosis present

## 2022-10-11 DIAGNOSIS — R2689 Other abnormalities of gait and mobility: Secondary | ICD-10-CM | POA: Diagnosis not present

## 2022-10-11 DIAGNOSIS — M25532 Pain in left wrist: Secondary | ICD-10-CM | POA: Diagnosis not present

## 2022-10-11 DIAGNOSIS — Z79899 Other long term (current) drug therapy: Secondary | ICD-10-CM | POA: Diagnosis not present

## 2022-10-11 DIAGNOSIS — N135 Crossing vessel and stricture of ureter without hydronephrosis: Secondary | ICD-10-CM | POA: Diagnosis not present

## 2022-10-11 DIAGNOSIS — Z888 Allergy status to other drugs, medicaments and biological substances status: Secondary | ICD-10-CM | POA: Diagnosis not present

## 2022-10-11 DIAGNOSIS — R410 Disorientation, unspecified: Secondary | ICD-10-CM | POA: Diagnosis not present

## 2022-10-11 DIAGNOSIS — Z885 Allergy status to narcotic agent status: Secondary | ICD-10-CM | POA: Diagnosis not present

## 2022-10-11 DIAGNOSIS — Z853 Personal history of malignant neoplasm of breast: Secondary | ICD-10-CM | POA: Diagnosis not present

## 2022-10-11 DIAGNOSIS — F419 Anxiety disorder, unspecified: Secondary | ICD-10-CM | POA: Diagnosis present

## 2022-10-11 DIAGNOSIS — E039 Hypothyroidism, unspecified: Secondary | ICD-10-CM | POA: Diagnosis present

## 2022-10-11 DIAGNOSIS — F0390 Unspecified dementia without behavioral disturbance: Secondary | ICD-10-CM | POA: Diagnosis present

## 2022-10-11 DIAGNOSIS — S0282XA Fracture of other specified skull and facial bones, left side, initial encounter for closed fracture: Secondary | ICD-10-CM | POA: Diagnosis not present

## 2022-10-11 DIAGNOSIS — R339 Retention of urine, unspecified: Secondary | ICD-10-CM | POA: Diagnosis not present

## 2022-10-11 DIAGNOSIS — M79642 Pain in left hand: Secondary | ICD-10-CM | POA: Diagnosis not present

## 2022-10-11 DIAGNOSIS — G4700 Insomnia, unspecified: Secondary | ICD-10-CM | POA: Diagnosis not present

## 2022-10-11 DIAGNOSIS — I440 Atrioventricular block, first degree: Secondary | ICD-10-CM | POA: Diagnosis not present

## 2022-10-11 DIAGNOSIS — S32502A Unspecified fracture of left pubis, initial encounter for closed fracture: Secondary | ICD-10-CM | POA: Diagnosis not present

## 2022-10-11 DIAGNOSIS — M79632 Pain in left forearm: Secondary | ICD-10-CM | POA: Diagnosis not present

## 2022-10-11 DIAGNOSIS — N130 Hydronephrosis with ureteropelvic junction obstruction: Secondary | ICD-10-CM | POA: Diagnosis present

## 2022-10-11 DIAGNOSIS — Z79890 Hormone replacement therapy (postmenopausal): Secondary | ICD-10-CM | POA: Diagnosis not present

## 2022-10-11 DIAGNOSIS — Z751 Person awaiting admission to adequate facility elsewhere: Secondary | ICD-10-CM | POA: Diagnosis not present

## 2022-10-11 DIAGNOSIS — S020XXA Fracture of vault of skull, initial encounter for closed fracture: Secondary | ICD-10-CM | POA: Diagnosis present

## 2022-10-11 DIAGNOSIS — S32592A Other specified fracture of left pubis, initial encounter for closed fracture: Secondary | ICD-10-CM | POA: Diagnosis present

## 2022-10-11 DIAGNOSIS — R0689 Other abnormalities of breathing: Secondary | ICD-10-CM | POA: Diagnosis not present

## 2022-10-11 DIAGNOSIS — S01112A Laceration without foreign body of left eyelid and periocular area, initial encounter: Secondary | ICD-10-CM | POA: Diagnosis present

## 2022-10-11 DIAGNOSIS — D62 Acute posthemorrhagic anemia: Secondary | ICD-10-CM | POA: Diagnosis not present

## 2022-10-11 DIAGNOSIS — R296 Repeated falls: Secondary | ICD-10-CM | POA: Diagnosis present

## 2022-10-19 DIAGNOSIS — F411 Generalized anxiety disorder: Secondary | ICD-10-CM | POA: Diagnosis not present

## 2022-10-19 DIAGNOSIS — E039 Hypothyroidism, unspecified: Secondary | ICD-10-CM | POA: Diagnosis not present

## 2022-10-19 DIAGNOSIS — S3282XB Multiple fractures of pelvis without disruption of pelvic ring, initial encounter for open fracture: Secondary | ICD-10-CM | POA: Diagnosis not present

## 2022-10-19 DIAGNOSIS — L932 Other local lupus erythematosus: Secondary | ICD-10-CM | POA: Diagnosis not present

## 2023-03-19 DIAGNOSIS — L932 Other local lupus erythematosus: Secondary | ICD-10-CM | POA: Diagnosis not present

## 2023-03-19 DIAGNOSIS — F411 Generalized anxiety disorder: Secondary | ICD-10-CM | POA: Diagnosis not present

## 2023-03-19 DIAGNOSIS — E039 Hypothyroidism, unspecified: Secondary | ICD-10-CM | POA: Diagnosis not present

## 2023-03-19 DIAGNOSIS — M542 Cervicalgia: Secondary | ICD-10-CM | POA: Diagnosis not present

## 2023-05-30 DIAGNOSIS — L932 Other local lupus erythematosus: Secondary | ICD-10-CM | POA: Diagnosis not present

## 2023-05-30 DIAGNOSIS — E039 Hypothyroidism, unspecified: Secondary | ICD-10-CM | POA: Diagnosis not present

## 2023-05-30 DIAGNOSIS — F324 Major depressive disorder, single episode, in partial remission: Secondary | ICD-10-CM | POA: Diagnosis not present

## 2023-05-30 DIAGNOSIS — M542 Cervicalgia: Secondary | ICD-10-CM | POA: Diagnosis not present

## 2023-05-30 DIAGNOSIS — F330 Major depressive disorder, recurrent, mild: Secondary | ICD-10-CM | POA: Diagnosis not present

## 2023-05-30 DIAGNOSIS — F411 Generalized anxiety disorder: Secondary | ICD-10-CM | POA: Diagnosis not present

## 2023-07-16 DIAGNOSIS — N3091 Cystitis, unspecified with hematuria: Secondary | ICD-10-CM | POA: Diagnosis not present

## 2023-07-16 DIAGNOSIS — I10 Essential (primary) hypertension: Secondary | ICD-10-CM | POA: Diagnosis not present

## 2023-07-26 DIAGNOSIS — B952 Enterococcus as the cause of diseases classified elsewhere: Secondary | ICD-10-CM | POA: Diagnosis not present

## 2023-07-26 DIAGNOSIS — N390 Urinary tract infection, site not specified: Secondary | ICD-10-CM | POA: Diagnosis not present

## 2024-04-14 DIAGNOSIS — Z0000 Encounter for general adult medical examination without abnormal findings: Secondary | ICD-10-CM | POA: Diagnosis not present

## 2024-04-14 DIAGNOSIS — I10 Essential (primary) hypertension: Secondary | ICD-10-CM | POA: Diagnosis not present

## 2024-04-14 DIAGNOSIS — N3091 Cystitis, unspecified with hematuria: Secondary | ICD-10-CM | POA: Diagnosis not present

## 2024-04-14 DIAGNOSIS — F331 Major depressive disorder, recurrent, moderate: Secondary | ICD-10-CM | POA: Diagnosis not present

## 2024-04-14 DIAGNOSIS — Z1331 Encounter for screening for depression: Secondary | ICD-10-CM | POA: Diagnosis not present

## 2024-05-26 DIAGNOSIS — M329 Systemic lupus erythematosus, unspecified: Secondary | ICD-10-CM | POA: Diagnosis not present

## 2024-05-26 DIAGNOSIS — E7849 Other hyperlipidemia: Secondary | ICD-10-CM | POA: Diagnosis not present

## 2024-05-26 DIAGNOSIS — I10 Essential (primary) hypertension: Secondary | ICD-10-CM | POA: Diagnosis not present

## 2024-05-26 DIAGNOSIS — E039 Hypothyroidism, unspecified: Secondary | ICD-10-CM | POA: Diagnosis not present

## 2024-05-26 DIAGNOSIS — G3184 Mild cognitive impairment, so stated: Secondary | ICD-10-CM | POA: Diagnosis not present

## 2024-05-26 DIAGNOSIS — F331 Major depressive disorder, recurrent, moderate: Secondary | ICD-10-CM | POA: Diagnosis not present
